# Patient Record
Sex: Male | Born: 1955 | Race: Black or African American | Hispanic: No | Marital: Single | Smoking: Current some day smoker
Health system: Southern US, Community
[De-identification: ages and names within clinical notes are randomized; demographics above are authoritative.]

## PROBLEM LIST (undated history)

## (undated) DIAGNOSIS — Z8711 Personal history of peptic ulcer disease: Secondary | ICD-10-CM

## (undated) DIAGNOSIS — M1990 Unspecified osteoarthritis, unspecified site: Secondary | ICD-10-CM

## (undated) DIAGNOSIS — Z8719 Personal history of other diseases of the digestive system: Secondary | ICD-10-CM

## (undated) DIAGNOSIS — M549 Dorsalgia, unspecified: Secondary | ICD-10-CM

## (undated) DIAGNOSIS — R51 Headache: Secondary | ICD-10-CM

## (undated) DIAGNOSIS — R531 Weakness: Secondary | ICD-10-CM

## (undated) DIAGNOSIS — H269 Unspecified cataract: Secondary | ICD-10-CM

## (undated) DIAGNOSIS — Z8709 Personal history of other diseases of the respiratory system: Secondary | ICD-10-CM

## (undated) DIAGNOSIS — K219 Gastro-esophageal reflux disease without esophagitis: Secondary | ICD-10-CM

## (undated) DIAGNOSIS — M2550 Pain in unspecified joint: Secondary | ICD-10-CM

## (undated) DIAGNOSIS — R0602 Shortness of breath: Secondary | ICD-10-CM

## (undated) DIAGNOSIS — I10 Essential (primary) hypertension: Secondary | ICD-10-CM

## (undated) DIAGNOSIS — M62838 Other muscle spasm: Secondary | ICD-10-CM

## (undated) HISTORY — PX: OTHER SURGICAL HISTORY: SHX169

## (undated) HISTORY — PX: SHOULDER SURGERY: SHX246

## (undated) HISTORY — PX: JOINT REPLACEMENT: SHX530

---

## 2002-02-10 ENCOUNTER — Encounter: Payer: Self-pay | Admitting: *Deleted

## 2002-02-10 ENCOUNTER — Emergency Department (HOSPITAL_COMMUNITY): Admission: EM | Admit: 2002-02-10 | Discharge: 2002-02-10 | Payer: Self-pay | Admitting: *Deleted

## 2003-09-05 ENCOUNTER — Emergency Department (HOSPITAL_COMMUNITY): Admission: EM | Admit: 2003-09-05 | Discharge: 2003-09-05 | Payer: Self-pay | Admitting: Emergency Medicine

## 2003-09-06 ENCOUNTER — Emergency Department (HOSPITAL_COMMUNITY): Admission: EM | Admit: 2003-09-06 | Discharge: 2003-09-06 | Payer: Self-pay | Admitting: Emergency Medicine

## 2004-02-19 ENCOUNTER — Emergency Department (HOSPITAL_COMMUNITY): Admission: EM | Admit: 2004-02-19 | Discharge: 2004-02-19 | Payer: Self-pay | Admitting: Emergency Medicine

## 2005-03-27 ENCOUNTER — Emergency Department (HOSPITAL_COMMUNITY): Admission: EM | Admit: 2005-03-27 | Discharge: 2005-03-27 | Payer: Self-pay | Admitting: Emergency Medicine

## 2005-08-02 ENCOUNTER — Emergency Department (HOSPITAL_COMMUNITY): Admission: EM | Admit: 2005-08-02 | Discharge: 2005-08-02 | Payer: Self-pay | Admitting: Emergency Medicine

## 2005-12-08 ENCOUNTER — Ambulatory Visit (HOSPITAL_COMMUNITY): Admission: RE | Admit: 2005-12-08 | Discharge: 2005-12-08 | Payer: Self-pay | Admitting: Internal Medicine

## 2007-04-02 ENCOUNTER — Emergency Department (HOSPITAL_COMMUNITY): Admission: EM | Admit: 2007-04-02 | Discharge: 2007-04-02 | Payer: Self-pay | Admitting: Emergency Medicine

## 2008-06-06 ENCOUNTER — Emergency Department (HOSPITAL_COMMUNITY): Admission: EM | Admit: 2008-06-06 | Discharge: 2008-06-06 | Payer: Self-pay | Admitting: Emergency Medicine

## 2010-01-16 ENCOUNTER — Emergency Department (HOSPITAL_COMMUNITY): Admission: EM | Admit: 2010-01-16 | Discharge: 2010-01-16 | Payer: Self-pay | Admitting: Emergency Medicine

## 2010-05-06 ENCOUNTER — Encounter: Admission: RE | Admit: 2010-05-06 | Discharge: 2010-05-06 | Payer: Self-pay | Admitting: Neurosurgery

## 2011-05-19 LAB — URINE MICROSCOPIC-ADD ON

## 2011-05-19 LAB — URINALYSIS, ROUTINE W REFLEX MICROSCOPIC
Nitrite: POSITIVE — AB
Protein, ur: 30 — AB
Urobilinogen, UA: 0.2

## 2011-10-23 ENCOUNTER — Encounter (HOSPITAL_COMMUNITY): Payer: Self-pay | Admitting: *Deleted

## 2011-10-23 ENCOUNTER — Emergency Department (HOSPITAL_COMMUNITY)
Admission: EM | Admit: 2011-10-23 | Discharge: 2011-10-23 | Disposition: A | Discharge status: Home / Self Care | Payer: Medicare Other | Attending: Emergency Medicine | Admitting: Emergency Medicine

## 2011-10-23 DIAGNOSIS — F17200 Nicotine dependence, unspecified, uncomplicated: Secondary | ICD-10-CM | POA: Insufficient documentation

## 2011-10-23 DIAGNOSIS — M25519 Pain in unspecified shoulder: Secondary | ICD-10-CM | POA: Insufficient documentation

## 2011-10-23 MED ORDER — HYDROCODONE-ACETAMINOPHEN 5-325 MG PO TABS: 1.0000 | ORAL_TABLET | ORAL | Status: AC | PRN

## 2011-10-23 MED ORDER — MELOXICAM 7.5 MG PO TABS: ORAL_TABLET | ORAL | Status: DC

## 2011-10-23 NOTE — Discharge Instructions (Signed)
Please use Mobic 2 times daily with food for inflammation. Please use Norco for pain if needed. This medication may cause drowsiness, please use with caution. Please see the orthopedist listed above for followup and evaluation of your shoulder.Arthralgia Arthralgia is joint pain. A joint is a place where two bones meet. Joint pain can happen for many reasons. The joint can be bruised, stiff, infected, or weak from aging. Pain usually goes away after resting and taking medicine for soreness.  HOME CARE  Rest the joint as told by your doctor.   Keep the sore joint raised (elevated) for the first 24 hours.   Put ice on the joint area.   Put ice in a plastic bag.   Place a towel between your skin and the bag.   Leave the ice on for 15 to 20 minutes, 3 to 4 times a day.   Wear your splint, casting, elastic bandage, or sling as told by your doctor.   Only take medicine as told by your doctor. Do not take aspirin.   Use crutches as told by your doctor. Do not put weight on the joint until told to by your doctor.  GET HELP RIGHT AWAY IF:   You have bruising, puffiness (swelling), or more pain.   Your fingers or toes turn blue or start to lose feeling (numb).   Your medicine does not lessen the pain.   Your pain becomes severe.   You have a temperature by mouth above 102 F (38.9 C), not controlled by medicine.   You cannot move or use the joint.  MAKE SURE YOU:   Understand these instructions.   Will watch your condition.   Will get help right away if you are not doing well or get worse.  Document Released: 07/12/2009 Document Revised: 07/13/2011 Document Reviewed: 07/12/2009 [facility] Patient Information 2012 [place], [place].
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
## 2011-10-23 NOTE — ED Notes (Signed)
Pain in right shoulder, states it has been hurting for over a month

## 2011-10-23 NOTE — ED Notes (Signed)
Pain rt shoulder x 1 month, has had surgery on this shoulder in past. Increased pain with movement.

## 2011-10-27 NOTE — ED Provider Notes (Signed)
Medical screening examination/treatment/procedure(s) were performed by non-physician practitioner and as supervising physician I was immediately available for consultation/collaboration.   Shelda Jakes, MD 10/27/11 [phone]

## 2011-12-19 ENCOUNTER — Encounter (HOSPITAL_COMMUNITY): Payer: Self-pay | Admitting: *Deleted

## 2011-12-19 ENCOUNTER — Emergency Department (HOSPITAL_COMMUNITY)
Admission: EM | Admit: 2011-12-19 | Discharge: 2011-12-20 | Disposition: A | Discharge status: Home / Self Care | Payer: Medicare Other | Source: Home / Self Care | Attending: Emergency Medicine | Admitting: Emergency Medicine

## 2011-12-19 DIAGNOSIS — F419 Anxiety disorder, unspecified: Secondary | ICD-10-CM

## 2011-12-19 DIAGNOSIS — R45850 Homicidal ideations: Secondary | ICD-10-CM | POA: Insufficient documentation

## 2011-12-19 DIAGNOSIS — F411 Generalized anxiety disorder: Secondary | ICD-10-CM | POA: Insufficient documentation

## 2011-12-19 LAB — CBC
MCHC: 35.5 g/dL (ref 30.0–36.0)
Platelets: 200 10*3/uL (ref 150–400)
RDW: 12.7 % (ref 11.5–15.5)
WBC: 7.2 10*3/uL (ref 4.0–10.5)

## 2011-12-19 LAB — COMPREHENSIVE METABOLIC PANEL
AST: 24 U/L (ref 0–37)
Albumin: 3.5 g/dL (ref 3.5–5.2)
Alkaline Phosphatase: 74 U/L (ref 39–117)
BUN: 12 mg/dL (ref 6–23)
Potassium: 4.3 mEq/L (ref 3.5–5.1)
Sodium: 138 mEq/L (ref 135–145)
Total Protein: 7.2 g/dL (ref 6.0–8.3)

## 2011-12-19 LAB — ETHANOL: Alcohol, Ethyl (B): 91 mg/dL — ABNORMAL HIGH (ref 0–11)

## 2011-12-19 LAB — DIFFERENTIAL
Basophils Absolute: 0 10*3/uL (ref 0.0–0.1)
Eosinophils Absolute: 0.1 10*3/uL (ref 0.0–0.7)
Lymphocytes Relative: 35 % (ref 12–46)
Monocytes Relative: 8 % (ref 3–12)
Neutro Abs: 4 10*3/uL (ref 1.7–7.7)
Neutrophils Relative %: 55 % (ref 43–77)

## 2011-12-19 LAB — RAPID URINE DRUG SCREEN, HOSP PERFORMED
Amphetamines: NOT DETECTED
Opiates: NOT DETECTED
Tetrahydrocannabinol: NOT DETECTED

## 2011-12-19 NOTE — ED Notes (Signed)
Pt began to vomit. Md advised and verbal orders obtained for IM phenergan. Pt refuses and states he feels better now.

## 2011-12-19 NOTE — [facility] Counselor (Signed)
PT HAS BEEN ACCEPTED TO [facility] RM 504-1 BYT DR. Allena Katz. PT WILL BE TRANSFER ONCE MEDICALLY CLEARED.

## 2011-12-19 NOTE — ED Notes (Signed)
Pt cooperative at this time.  Pt ate dinner and is watching tv quietly.

## 2011-12-19 NOTE — ED Notes (Signed)
"  I feel like I'm overloaded. And I can't stop",  Feels like he will hurt "alot of people"

## 2011-12-19 NOTE — ED Notes (Signed)
Pt refusing to have blood drawn for labs; pt states "You will have to get a gun to draw my blood" MD notified; Awaiting till pt evaluated by ACT team before any more attempts to draw blood.

## 2011-12-19 NOTE — ED Provider Notes (Signed)
History  Scribed for Geoffery Lyons, MD, the patient was seen in room [facility]. This chart was scribed by Candelaria Stagers. The patient's care started at 5:01 PM    CSN: [id number]  Arrival date & time 12/19/11  1608   First MD Initiated Contact with Patient 12/19/11 1623      No chief complaint on file.    The history is provided by the patient.   Stephen Sullivan is a 56 y.o. male who presents to the Emergency Department complaining of "feeling overloaded".  Pt states that he feels high anxiety and feels like he might snap and hurt people.  He reports that life and his girlfriend have set him off and he has never felt like this before.  He denies suicidal thoughts.  He has no h/o psych issues and takes no medication.    History reviewed. No pertinent past medical history.  Past Surgical History  Procedure Date  . Shoulder surgery   . Hip surgery     History reviewed. No pertinent family history.  History  Substance Use Topics  . Smoking status: Current Everyday Smoker -- 0.5 packs/day  . Smokeless tobacco: Not on file  . Alcohol Use: Yes      Review of Systems  Psychiatric/Behavioral: Positive for behavioral problems. Negative for suicidal ideas.       Homicidal thoughts  All other systems reviewed and are negative.    Allergies  Review of patient's allergies indicates no known allergies.  Home Medications   Current Outpatient Rx  Name Route Sig Dispense Refill  . MELOXICAM 7.5 MG PO TABS  1 po bid with food 14 tablet 0    BP 121/82  Pulse 107  Temp(Src) 98.4 F (36.9 C) (Oral)  Resp 20  Ht 5\' 11"  (1.803 m)  Wt 160 lb (72.576 kg)  BMI 22.32 kg/m2  SpO2 94%  Physical Exam  Nursing note and vitals reviewed. Constitutional: He is oriented to person, place, and time. He appears well-developed and well-nourished.  HENT:  Head: Normocephalic and atraumatic.  Eyes: Conjunctivae and EOM are normal. Pupils are equal, round, and reactive to light.  Neck:  Normal range of motion.  Pulmonary/Chest: Effort normal and breath sounds normal.  Musculoskeletal: Normal range of motion. He exhibits no edema.  Neurological: He is alert and oriented to person, place, and time.  Skin: Skin is warm and dry.    ED Course  Procedures   DIAGNOSTIC STUDIES: Oxygen Saturation is 94% on room air, normal by my interpretation.    COORDINATION OF CARE:  17:06PM Ordered: Consult to call act team ; CBC ; Differential ; Basic metabolic panel ; Drug screen panel, emergency ; Ethanol  20:37 Ordered: CBC ; Differential ; Comprehensive metabolic panel ; Ethanol    Labs Reviewed  URINE RAPID DRUG SCREEN (HOSP PERFORMED)  CBC  DIFFERENTIAL  COMPREHENSIVE METABOLIC PANEL  ETHANOL   No results found.   No diagnosis found.    MDM  The patient was seen by the act team and arrangements have been made for transfer to behavioral health.  The labs look okay and the patient is medically cleared for transfer.   I personally performed the services described in this documentation, which was scribed in my presence. The recorded information has been reviewed and considered.           Geoffery Lyons, MD 12/19/11 [phone]
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
## 2011-12-20 ENCOUNTER — Encounter (HOSPITAL_COMMUNITY): Payer: Self-pay

## 2011-12-20 ENCOUNTER — Inpatient Hospital Stay (HOSPITAL_COMMUNITY)
Admission: AD | Admit: 2011-12-20 | Discharge: 2011-12-20 | DRG: 882 | Disposition: A | Discharge status: Home / Self Care | Payer: Medicare Other | Source: Ambulatory Visit | Attending: Psychiatry | Admitting: Psychiatry

## 2011-12-20 DIAGNOSIS — F4320 Adjustment disorder, unspecified: Secondary | ICD-10-CM | POA: Diagnosis present

## 2011-12-20 DIAGNOSIS — F320 Major depressive disorder, single episode, mild: Secondary | ICD-10-CM

## 2011-12-20 DIAGNOSIS — F17200 Nicotine dependence, unspecified, uncomplicated: Secondary | ICD-10-CM | POA: Diagnosis present

## 2011-12-20 DIAGNOSIS — I10 Essential (primary) hypertension: Secondary | ICD-10-CM | POA: Diagnosis present

## 2011-12-20 DIAGNOSIS — F4325 Adjustment disorder with mixed disturbance of emotions and conduct: Principal | ICD-10-CM | POA: Diagnosis present

## 2011-12-20 MED ORDER — TRAZODONE HCL 100 MG PO TABS: 100.0000 mg | ORAL_TABLET | Freq: Every day | ORAL | Status: DC

## 2011-12-20 MED ORDER — ACETAMINOPHEN 325 MG PO TABS: 650.0000 mg | ORAL_TABLET | Freq: Four times a day (QID) | ORAL | Status: DC | PRN

## 2011-12-20 MED ORDER — BENAZEPRIL HCL 10 MG PO TABS: 10.0000 mg | ORAL_TABLET | Freq: Every day | ORAL | Status: DC

## 2011-12-20 MED ORDER — ALUM & MAG HYDROXIDE-SIMETH 200-200-20 MG/5ML PO SUSP: 30.0000 mL | ORAL | Status: DC | PRN

## 2011-12-20 MED ORDER — MAGNESIUM HYDROXIDE 400 MG/5ML PO SUSP: 30.0000 mL | Freq: Every day | ORAL | Status: DC | PRN

## 2011-12-20 MED ORDER — TRAZODONE HCL 100 MG PO TABS
100.0000 mg | ORAL_TABLET | Freq: Every day | ORAL | Status: DC
  Filled 2011-12-20 (×2): qty 1

## 2011-12-20 MED ORDER — BENAZEPRIL HCL 10 MG PO TABS
10.0000 mg | ORAL_TABLET | Freq: Every day | ORAL | Status: DC
  Administered 2011-12-20: 10 mg via ORAL
  Filled 2011-12-20 (×2): qty 1

## 2011-12-20 MED ORDER — NICOTINE 14 MG/24HR TD PT24
14.0000 mg | MEDICATED_PATCH | Freq: Once | TRANSDERMAL | Status: DC
  Administered 2011-12-20: 14 mg via TRANSDERMAL
  Filled 2011-12-20: qty 1

## 2011-12-20 NOTE — [facility] Suicide Risk Assessment (Signed)
Suicide Risk Assessment  Admission Assessment     See D/C Suicide risk assessment this date and hour.  Stephen Sullivan 12/20/2011, 2:18 PM

## 2011-12-20 NOTE — [facility] Counselor (Signed)
Counselor was unable to get consent from [place] or to contact support person for suicide prevention information, due to him being discharged so quickly. Counselor was unaware that he was being discharged until after he had left. According to notes from assessment counselor, Chanan did not state that he was suicidal, but he has attempted suicide in the past which was triggered by a similar situation (breakup with a girlfriend), and could not contract for safety at the time of admission.   Billie Lade 12/20/2011  3:07 PM
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
## 2011-12-20 NOTE — Progress Notes (Addendum)
[facility] MD Progress Note  12/20/2011 2:07 PM  Diagnosis:  Axis I: Adjustment Disorder NOS  ADL's:  Intact  Sleep: Fair  Appetite:  Good  Suicidal Ideation:  Denies adamantly any suicidal thoughts. Homicidal Ideation:  Denies adamantly any homicidal thoughts.  Mental Status Examination/Evaluation: Objective:  Appearance: Casual  Eye Contact::  Good  Speech:  Clear and Coherent  Volume:  Normal  Mood:  Euthymic  Affect:  Congruent  Thought Process:  Coherent  Orientation:  Full  Thought Content:  WDL  Suicidal Thoughts:  No  Homicidal Thoughts:  No  Memory:  Immediate;   Good  Judgement:  Good  Insight:  Good  Psychomotor Activity:  Normal  Concentration:  Good  Recall:  Good  Akathisia:  No  AIMS (if indicated):     Assets:  Communication Skills Desire for Improvement  Sleep:  Number of Hours: 2    Vital Signs:Blood pressure 155/101, pulse 80, temperature 97.3 F (36.3 C), temperature source Oral, resp. rate 18, height 5\' 7"  (1.702 m), weight 72.576 kg (160 lb). Current Medications: Current Facility-Administered Medications  Medication Dose Route Frequency Provider Last Rate Last Dose  . acetaminophen (TYLENOL) tablet 650 mg  650 mg Oral Q6H PRN Curlene Labrum Readling, MD      . alum & mag hydroxide-simeth (MAALOX/MYLANTA) 200-200-20 MG/5ML suspension 30 mL  30 mL Oral Q4H PRN Curlene Labrum Readling, MD      . magnesium hydroxide (MILK OF MAGNESIA) suspension 30 mL  30 mL Oral Daily PRN Curlene Labrum Readling, MD      . traZODone (DESYREL) tablet 100 mg  100 mg Oral QHS Ronny Bacon, MD       Facility-Administered Medications Ordered in Other Encounters  Medication Dose Route Frequency Provider Last Rate Last Dose  . DISCONTD: nicotine (NICODERM CQ - dosed in mg/24 hours) patch 14 mg  14 mg Transdermal Once [employer]. Colon Branch, MD   14 mg at 12/20/11 0056    Lab Results:  Results for orders placed during the hospital encounter of 12/19/11 (from the past 48 hour(s))  CBC     Status:  Abnormal   Collection Time   12/19/11  9:25 PM      Component Value Range Comment   WBC 7.2  4.0 - 10.5 (K/uL)    RBC 5.14  4.22 - 5.81 (MIL/uL)    Hemoglobin 17.1 (*) 13.0 - 17.0 (g/dL)    HCT 08.6  57.8 - 46.9 (%)    MCV 93.8  78.0 - 100.0 (fL)    MCH 33.3  26.0 - 34.0 (pg)    MCHC 35.5  30.0 - 36.0 (g/dL)    RDW 62.9  52.8 - 41.3 (%)    Platelets 200  150 - 400 (K/uL)   DIFFERENTIAL     Status: Normal   Collection Time   12/19/11  9:25 PM      Component Value Range Comment   Neutrophils Relative 55  43 - 77 (%)    Lymphocytes Relative 35  12 - 46 (%)    Monocytes Relative 8  3 - 12 (%)    Eosinophils Relative 2  0 - 5 (%)    Basophils Relative 0  0 - 1 (%)    Neutro Abs 4.0  1.7 - 7.7 (K/uL)    Lymphs Abs 2.5  0.7 - 4.0 (K/uL)    Monocytes Absolute 0.6  0.1 - 1.0 (K/uL)    Eosinophils Absolute 0.1  0.0 - 0.7 (  K/uL)    Basophils Absolute 0.0  0.0 - 0.1 (K/uL)    Smear Review MORPHOLOGY UNREMARKABLE     COMPREHENSIVE METABOLIC PANEL     Status: Abnormal   Collection Time   12/19/11  9:25 PM      Component Value Range Comment   Sodium 138  135 - 145 (mEq/L)    Potassium 4.3  3.5 - 5.1 (mEq/L) SLIGHT HEMOLYSIS   Chloride 102  96 - 112 (mEq/L)    CO2 21  19 - 32 (mEq/L)    Glucose, Bld 87  70 - 99 (mg/dL)    BUN 12  6 - 23 (mg/dL)    Creatinine, Ser 6.04  0.50 - 1.35 (mg/dL)    Calcium 9.3  8.4 - 10.5 (mg/dL)    Total Protein 7.2  6.0 - 8.3 (g/dL)    Albumin 3.5  3.5 - 5.2 (g/dL)    AST 24  0 - 37 (U/L) SLIGHT HEMOLYSIS   ALT 17  0 - 53 (U/L)    Alkaline Phosphatase 74  39 - 117 (U/L)    Total Bilirubin 0.3  0.3 - 1.2 (mg/dL)    GFR calc non Af Amer 62 (*) >90 (mL/min)    GFR calc Af Amer 72 (*) >90 (mL/min)   ETHANOL     Status: Abnormal   Collection Time   12/19/11  9:25 PM      Component Value Range Comment   Alcohol, Ethyl (B) 91 (*) 0 - 11 (mg/dL)   URINE RAPID DRUG SCREEN (HOSP PERFORMED)     Status: Abnormal   Collection Time   12/19/11 10:49 PM      Component  Value Range Comment   Opiates NONE DETECTED  NONE DETECTED     Cocaine POSITIVE (*) NONE DETECTED     Benzodiazepines NONE DETECTED  NONE DETECTED     Amphetamines NONE DETECTED  NONE DETECTED     Tetrahydrocannabinol NONE DETECTED  NONE DETECTED     Barbiturates NONE DETECTED  NONE DETECTED      Physical Findings: AIMS:  , ,  ,  ,    CIWA:    COWS:     Treatment Plan Summary: Discharge as pt's crisis has passed and he has a plan that will help him better handle it.  Plan: D/C now. Nurse practitioner will give him Lotensin now for his elevated blood pressure.  Deslyn Cavenaugh 12/20/2011, 2:07 PM
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
## 2011-12-20 NOTE — Progress Notes (Signed)
12/20/2011 Nursing DC 1430 Per MD order..the patient given DC instructions via  AVS. Pt stated he understood and will comply with F/ U plans listed on AVS. Pt's BP checked prior to DC and given to A Nwoko NP and per her request, pt given  Dose of benazapril 10 mg PO. Pt's BP  Sitting 151/92 and standing 146/98. Pt denied SI, HI, and / or peresence of audit, vis, tactile halluc. Pt states he is safe, that he will get prescription for benazapril filled today and that he will cont taking in the morning.Pt's belongs returned to him and he is escorted to bldg entrance where he is DC'd. PD RN [facility]

## 2011-12-20 NOTE — Progress Notes (Signed)
[facility] Case Management Discharge Plan:  Will you be returning to the same living situation after discharge: Yes,  Patient will return to his home at discharge At discharge, do you have transportation home?:Yes,  Patient will arrange for transportation home Do you have the ability to pay for your medications:Yes,  Patient has insurance  Interagency Information:     Release of information consent forms completed and in the chart;  Patient's signature needed at discharge.  Patient to Follow up at:    Patient denies SI/HI:   Yes,  Patient is no longer endorsing thoughts of HI or SI    Safety Planning and Suicide Prevention discussed:  Yes,  Reviewed with patient individually  Barrier to discharge identified:Yes,  Conflictual relationships with ex-girlfriend  Summary and Recommendations:  Patient advised of plans to get 50-B against girlfriend.  He is also encouraged to follow up with outpatient recommendations   Wynn Banker 12/20/2011, 11:21 AM

## 2011-12-20 NOTE — Discharge Instructions (Signed)
Strongly consider attending at least 6 [employer] Meetings to help you learn about how your helping others to the exclusion of helping yourself is actually hurting yourself and is actually an addiction to fixing others and that you need to work the 12 Step to Happiness through the Alanon Tradition. [employer] Family Groups could be helpful with how to deal with substance abusing family and friends. Or your own issues of being in victim role.  There are only 40 [employer] Family Group meetings a week here in [place].  There are [employer] Phone meetings.  Search on line and there you can learn the format and can access the schedule for yourself.  They ask you to temproarily block call waiting by starting with *70 then their number is [phone]  

## 2011-12-20 NOTE — Progress Notes (Signed)
Patient ID: Stephen Sullivan, male   DOB: 04-29-1956, 56 y.o.   MRN: [id number]

## 2011-12-20 NOTE — Progress Notes (Signed)
28 yr.old, divorced male admitted with chief complaint of, "overloaded---felt I could't stop myself---wanted to come in to talk to someone before I hurt somebody."  This is his first psychiatric hospitalization.  Hesitant to disclose information therefore most of information obtained by question and answer method. Denies any legal problems  Admits but minimizes  Use of ETOH, Marijuana and crack cocaine, explaining, "I don't have money to buy it."   Anger is directed towards his girlfriend who "wants me to buy her everything."  Denies any history of disturbed thinking or suicidal behavior.  Relates in pleasant, avoidant manner.  Severe "acne scars on torso and arms.  No medical illnesses.  Blood pressure elevated on admission.

## 2011-12-20 NOTE — Tx Team (Signed)
Initial Interdisciplinary Treatment Plan  PATIENT STRENGTHS: (choose at least two) Average or above average intelligence Capable of independent living Communication skills General fund of knowledge Physical Health  PATIENT STRESSORS: Marital or family conflict Substance abuse   PROBLEM LIST: Problem List/Patient Goals Date to be addressed Date deferred Reason deferred Estimated date of resolution  Anger--conflict with girlfriend 12/19/11     No social  outlets 12/19/11     Limited inancial resources 12/19/11     Boredom 5/14//13                                    DISCHARGE CRITERIA:  Improved stabilization in mood, thinking, and/or behavior Motivation to continue treatment in a less acute level of care Need for constant or close observation no longer present Safe-care adequate arrangements made Verbal commitment to aftercare and medication compliance  PRELIMINARY DISCHARGE PLAN: Outpatient therapy Return to previous living arrangement  PATIENT/FAMIILY INVOLVEMENT: This treatment plan has been presented to and reviewed with the patient, ONA RATHERT, and/or family member,.  The patient and family have been given the opportunity to ask questions and make suggestions.  Alicia Amel 12/20/2011, 5:39 AM

## 2011-12-20 NOTE — Tx Team (Signed)
Interdisciplinary Treatment Plan Update (Adult)  Date:  12/20/2011  Time Reviewed:  10:27 AM   Progress in Treatment: Attending groups:   Yes   Participating in groups:  Yes Taking medication as prescribed:  Yes Tolerating medication:  Yes Family/Significant othe contact made: Counselor to follow up with patient regarding collateral contact Patient understands diagnosis:  Yes Discussing patient identified problems/goals with staff: Yes Medical problems stabilized or resolved: Yes Denies suicidal/homicidal ideation:Yes Issues/concerns per patient self-inventory:  None identified Other:  New problem(s) identified:  Reason for Continuation of Hospitalization: Depression Medication stabilization  Interventions implemented related to continuation of hospitalization:  Medication Management; safety checks q 15 mins  Additional comments:  Estimated length of stay:  1-2 days  Discharge Plan:  Home with outpatient follow up  New goal(s):  Review of initial/current patient goals per problem list:    1.  Goal(s):  Eliminate HI  Met:  Yes  Target date: d/c  As evidenced by:  Patient no longer endorsing HI or any thoughts of self harm  2.  Goal (s): Decrease depression/anxiety  (rated at    On admission)  Met:  Yes  Target date: d/c  As evidenced by: Patient will rate depression at four our below (currently rating depression and anxiety at one)  3.  Goal(s):   Met:  Yes  Target date:  As evidenced by:  4.  Goal(s):  Refer for outpatient services  Met:  No  Target date: d/c  As evidenced by: Patient will have outpatient services in place  Attendees: Patient:  Stephen Sullivan 12/20/2011 11:20 AM  Family:     Physician:  Orson Aloe, MD 12/20/2011 10:27 AM   Nursing:   Shelda Jakes, RN 12/20/2011 10:27 AM   CaseManager:  Juline Patch, LCSW 12/20/2011 10:27 AM   Counselor:  Angus Palms, LCSW 12/20/2011 10:27 AM   Other:  Omelia Blackwater, RN 12/20/2011 10:42 AM    Other:  Reyes Ivan, LCSWA 12/20/2011  10:27 AM  Other:  Dr. Alfonso Patten, [facility] 12/20/2011  11:20 AM  Other:      Scribe for Treatment Team:   Wynn Banker, LCSW,  12/20/2011 10:27 AM

## 2011-12-21 NOTE — H&P (Signed)
Medical/psychiatric screening examination/treatment/procedure(s) were performed by non-physician practitioner and as supervising physician I was immediately available for consultation/collaboration.  I have seen and examined this patient and agree the major elements of this evaluation.  

## 2011-12-21 NOTE — Discharge Summary (Signed)
I agree with this D/C Summary.  

## 2011-12-22 NOTE — Progress Notes (Signed)
Patient Discharge Instructions:  After Visit Summary (AVS):   Access to EMR:  12/22/2011 Psychiatric Admission Assessment Note:   Access to EMR:  12/22/2011 Suicide Risk Assessment - Discharge Assessment:   Access to EMR:  12/22/2011 Next Level Care Provider Has Access to the EMR, 12/22/2011  Provided records to Dr. Tamsen Snider at [facility] via [facility] access.  Wandra Scot, 12/22/2011, 2:37 PM

## 2012-01-08 ENCOUNTER — Ambulatory Visit (HOSPITAL_COMMUNITY): Payer: Self-pay | Admitting: Psychology

## 2012-08-12 ENCOUNTER — Encounter (HOSPITAL_COMMUNITY): Payer: Self-pay

## 2012-08-12 ENCOUNTER — Emergency Department (HOSPITAL_COMMUNITY): Payer: Medicare Other

## 2012-08-12 ENCOUNTER — Emergency Department (HOSPITAL_COMMUNITY)
Admission: EM | Admit: 2012-08-12 | Discharge: 2012-08-12 | Disposition: A | Discharge status: Home / Self Care | Payer: Medicare Other | Attending: Emergency Medicine | Admitting: Emergency Medicine

## 2012-08-12 DIAGNOSIS — F17200 Nicotine dependence, unspecified, uncomplicated: Secondary | ICD-10-CM | POA: Insufficient documentation

## 2012-08-12 DIAGNOSIS — R109 Unspecified abdominal pain: Secondary | ICD-10-CM | POA: Insufficient documentation

## 2012-08-12 DIAGNOSIS — R10A0 Flank pain, unspecified side: Secondary | ICD-10-CM

## 2012-08-12 DIAGNOSIS — R21 Rash and other nonspecific skin eruption: Secondary | ICD-10-CM | POA: Insufficient documentation

## 2012-08-12 DIAGNOSIS — Z79899 Other long term (current) drug therapy: Secondary | ICD-10-CM | POA: Insufficient documentation

## 2012-08-12 LAB — CBC WITH DIFFERENTIAL/PLATELET
Basophils Relative: 0 % (ref 0–1)
HCT: 47.2 % (ref 39.0–52.0)
Hemoglobin: 16 g/dL (ref 13.0–17.0)
Lymphocytes Relative: 23 % (ref 12–46)
MCHC: 33.9 g/dL (ref 30.0–36.0)
MCV: 93.8 fL (ref 78.0–100.0)
Monocytes Absolute: 0.6 10*3/uL (ref 0.1–1.0)
Monocytes Relative: 6 % (ref 3–12)
Neutro Abs: 7.9 10*3/uL — ABNORMAL HIGH (ref 1.7–7.7)

## 2012-08-12 LAB — BASIC METABOLIC PANEL
BUN: 13 mg/dL (ref 6–23)
CO2: 28 mEq/L (ref 19–32)
Chloride: 102 mEq/L (ref 96–112)
Creatinine, Ser: 1.39 mg/dL — ABNORMAL HIGH (ref 0.50–1.35)

## 2012-08-12 LAB — URINALYSIS, ROUTINE W REFLEX MICROSCOPIC
Bilirubin Urine: NEGATIVE
Glucose, UA: NEGATIVE mg/dL
Ketones, ur: NEGATIVE mg/dL
pH: 6 (ref 5.0–8.0)

## 2012-08-12 MED ORDER — HYDROCORTISONE 1 % EX OINT: TOPICAL_OINTMENT | Freq: Two times a day (BID) | CUTANEOUS | Status: DC

## 2012-08-12 MED ORDER — BENAZEPRIL HCL 10 MG PO TABS: 10.0000 mg | ORAL_TABLET | Freq: Every day | ORAL | Status: DC

## 2012-08-12 MED ORDER — OXYCODONE-ACETAMINOPHEN 5-325 MG PO TABS: 1.0000 | ORAL_TABLET | Freq: Four times a day (QID) | ORAL | Status: DC | PRN

## 2012-08-12 MED ORDER — MORPHINE SULFATE 4 MG/ML IJ SOLN
4.0000 mg | Freq: Once | INTRAMUSCULAR | Status: AC
  Administered 2012-08-12: 4 mg via INTRAVENOUS
  Filled 2012-08-12: qty 1

## 2012-08-12 MED ORDER — KETOROLAC TROMETHAMINE 30 MG/ML IJ SOLN
30.0000 mg | Freq: Once | INTRAMUSCULAR | Status: AC
  Administered 2012-08-12: 30 mg via INTRAVENOUS
  Filled 2012-08-12: qty 1

## 2012-08-12 NOTE — ED Provider Notes (Signed)
History   Scribed for No att. providers found, the patient was seen in room APA14/APA14 . This chart was scribed by Lewanda Rife.   CSN: [id number]  Arrival date & time 08/12/12  1408   First MD Initiated Contact with Patient 08/12/12 1512      Chief Complaint  Patient presents with  . Rib pain     (Consider location/radiation/quality/duration/timing/severity/associated sxs/prior treatment) HPI Stephen Sullivan is a 57 y.o. male who presents to the Emergency Department complaining of constant severe rib pain for the past 8 days. Pt denies injury to area. Pt states deep breaths are limited due to pain. Pt reports nothing improves pain. Pt states pain is aggravated by nothing.  No fever. No cough. No dysuria. No rash.     History reviewed. No pertinent past medical history.  Past Surgical History  Procedure Date  . Shoulder surgery   . Hip surgery     No family history on file.  History  Substance Use Topics  . Smoking status: Current Every Day Smoker -- 0.5 packs/day  . Smokeless tobacco: Not on file  . Alcohol Use: Yes      Review of Systems  Musculoskeletal:       Rib pain   Skin: Positive for rash.  All other systems reviewed and are negative.    Allergies  Review of patient's allergies indicates no known allergies.  Home Medications   Current Outpatient Rx  Name  Route  Sig  Dispense  Refill  . BENAZEPRIL HCL 10 MG PO TABS   Oral   Take 1 tablet (10 mg total) by mouth daily. For hypertension   30 tablet   0   . HYDROCORTISONE 1 % EX OINT   Topical   Apply topically 2 (two) times daily.   30 g   0   . OXYCODONE-ACETAMINOPHEN 5-325 MG PO TABS   Oral   Take 1-2 tablets by mouth every 6 (six) hours as needed for pain.   20 tablet   0   . TRAZODONE HCL 100 MG PO TABS   Oral   Take 1 tablet (100 mg total) by mouth at bedtime. For sleep   30 tablet   0     BP 159/95  Pulse 67  Temp 97.8 F (36.6 C) (Oral)  Resp 20  Ht 5\' 11"   (1.803 m)  Wt 175 lb (79.379 kg)  BMI 24.41 kg/m2  SpO2 97%  Physical Exam  Nursing note and vitals reviewed. Constitutional: He is oriented to person, place, and time. He appears well-developed and well-nourished.  HENT:  Head: Normocephalic and atraumatic.  Eyes: Conjunctivae normal are normal.  Neck: Normal range of motion.  Cardiovascular: Normal rate and regular rhythm.   Pulmonary/Chest: Effort normal and breath sounds normal.  Abdominal: Soft.  Musculoskeletal: Normal range of motion. He exhibits tenderness.       Tender in left lower ribs  No crepitus noted      Neurological: He is alert and oriented to person, place, and time.  Skin: Skin is warm and dry. Rash ( Macular rash all over back) noted.  Psychiatric: He has a normal mood and affect.    ED Course  Procedures (including critical care time)  Labs Reviewed  CBC WITH DIFFERENTIAL - Abnormal; Notable for the following:    WBC 11.3 (*)     Neutro Abs 7.9 (*)     All other components within normal limits  BASIC METABOLIC PANEL - Abnormal; Notable  for the following:    Creatinine, Ser 1.39 (*)     GFR calc non Af Amer 55 (*)     GFR calc Af Amer 64 (*)     All other components within normal limits  URINALYSIS, ROUTINE W REFLEX MICROSCOPIC  D-DIMER, QUANTITATIVE   Dg Chest 2 View  08/12/2012  *RADIOLOGY REPORT*  Clinical Data: 57 year old male left side chest pain.  CHEST - 2 VIEW  Comparison: 02/19/2004.  Findings: High  Cardiac size and mediastinal contours are within normal limits.  Visualized tracheal air column is within normal limits.  Lung volumes are within normal limits.  No pneumothorax, pulmonary edema, pleural effusion or confluent pulmonary opacity. Postoperative changes to the right shoulder re-identified. No acute osseous abnormality identified.  IMPRESSION: No acute cardiopulmonary abnormality.   Original Report Authenticated By: Erskine Speed, M.D.      1. Flank pain       MDM  Patient with  left flank pain. Worse with movement. No trauma. He has a rash in this area, however is similar to his rash over his back that he's had for long as he can remember. He has a negative d-dimer. X-ray does not show a cause. Could be musculoskeletal. I doubt intraabdominal pathology as a cause. Patient will be discharged home with pain medications hydrocortisone ointment for his chronic back rash. He also requests a refill of his blood pressure medicine. He'll follow with his primary care Dr.      I personally performed the services described in this documentation, which was scribed in my presence. The recorded information has been reviewed and is accurate.     Juliet Rude. Rubin Payor, MD 08/12/12 [phone]

## 2012-08-12 NOTE — ED Notes (Signed)
Pt reports left side pain for the past few days.  Pt denies any injury to the area.  Pt does report some sob.

## 2013-06-01 ENCOUNTER — Emergency Department (HOSPITAL_COMMUNITY)
Admission: EM | Admit: 2013-06-01 | Discharge: 2013-06-01 | Disposition: A | Discharge status: Home / Self Care | Payer: Medicare Other | Attending: Emergency Medicine | Admitting: Emergency Medicine

## 2013-06-01 ENCOUNTER — Encounter (HOSPITAL_COMMUNITY): Payer: Self-pay | Admitting: Emergency Medicine

## 2013-06-01 ENCOUNTER — Emergency Department (HOSPITAL_COMMUNITY): Payer: Medicare Other

## 2013-06-01 DIAGNOSIS — F1910 Other psychoactive substance abuse, uncomplicated: Secondary | ICD-10-CM | POA: Insufficient documentation

## 2013-06-01 DIAGNOSIS — R05 Cough: Secondary | ICD-10-CM | POA: Insufficient documentation

## 2013-06-01 DIAGNOSIS — R071 Chest pain on breathing: Secondary | ICD-10-CM | POA: Insufficient documentation

## 2013-06-01 DIAGNOSIS — E119 Type 2 diabetes mellitus without complications: Secondary | ICD-10-CM | POA: Insufficient documentation

## 2013-06-01 DIAGNOSIS — R059 Cough, unspecified: Secondary | ICD-10-CM | POA: Insufficient documentation

## 2013-06-01 DIAGNOSIS — R0789 Other chest pain: Secondary | ICD-10-CM

## 2013-06-01 DIAGNOSIS — M2550 Pain in unspecified joint: Secondary | ICD-10-CM | POA: Insufficient documentation

## 2013-06-01 DIAGNOSIS — I10 Essential (primary) hypertension: Secondary | ICD-10-CM | POA: Insufficient documentation

## 2013-06-01 DIAGNOSIS — F411 Generalized anxiety disorder: Secondary | ICD-10-CM | POA: Insufficient documentation

## 2013-06-01 DIAGNOSIS — R259 Unspecified abnormal involuntary movements: Secondary | ICD-10-CM | POA: Insufficient documentation

## 2013-06-01 DIAGNOSIS — R251 Tremor, unspecified: Secondary | ICD-10-CM

## 2013-06-01 DIAGNOSIS — R61 Generalized hyperhidrosis: Secondary | ICD-10-CM | POA: Insufficient documentation

## 2013-06-01 DIAGNOSIS — F17200 Nicotine dependence, unspecified, uncomplicated: Secondary | ICD-10-CM | POA: Insufficient documentation

## 2013-06-01 DIAGNOSIS — R110 Nausea: Secondary | ICD-10-CM | POA: Insufficient documentation

## 2013-06-01 DIAGNOSIS — R509 Fever, unspecified: Secondary | ICD-10-CM | POA: Insufficient documentation

## 2013-06-01 HISTORY — DX: Essential (primary) hypertension: I10

## 2013-06-01 LAB — COMPREHENSIVE METABOLIC PANEL
BUN: 17 mg/dL (ref 6–23)
CO2: 24 mEq/L (ref 19–32)
Chloride: 97 mEq/L (ref 96–112)
Creatinine, Ser: 1.36 mg/dL — ABNORMAL HIGH (ref 0.50–1.35)
GFR calc non Af Amer: 57 mL/min — ABNORMAL LOW (ref >90)
Total Bilirubin: 0.9 mg/dL (ref 0.3–1.2)

## 2013-06-01 LAB — CBC WITH DIFFERENTIAL/PLATELET
Basophils Relative: 0 % (ref 0–1)
HCT: 51.6 % (ref 39.0–52.0)
Hemoglobin: 18.4 g/dL — ABNORMAL HIGH (ref 13.0–17.0)
MCHC: 35.7 g/dL (ref 30.0–36.0)
MCV: 92.3 fL (ref 78.0–100.0)
Monocytes Absolute: 0.9 10*3/uL (ref 0.1–1.0)
Monocytes Relative: 9 % (ref 3–12)
Neutro Abs: 6.8 10*3/uL (ref 1.7–7.7)

## 2013-06-01 LAB — RAPID URINE DRUG SCREEN, HOSP PERFORMED: Opiates: NOT DETECTED

## 2013-06-01 LAB — ETHANOL: Alcohol, Ethyl (B): <11 mg/dL (ref 0–11)

## 2013-06-01 LAB — URINE MICROSCOPIC-ADD ON

## 2013-06-01 LAB — ACETAMINOPHEN LEVEL: Acetaminophen (Tylenol), Serum: <15 ug/mL (ref 10–30)

## 2013-06-01 LAB — URINALYSIS, ROUTINE W REFLEX MICROSCOPIC
Leukocytes, UA: NEGATIVE
Protein, ur: NEGATIVE mg/dL
Urobilinogen, UA: 0.2 mg/dL (ref 0.0–1.0)

## 2013-06-01 LAB — TROPONIN I: Troponin I: <0.3 ng/mL (ref <0.30)

## 2013-06-01 MED ORDER — SODIUM CHLORIDE 0.9 % IV BOLUS (SEPSIS)
1000.0000 mL | Freq: Once | INTRAVENOUS | Status: AC
  Administered 2013-06-01: 1000 mL via INTRAVENOUS

## 2013-06-01 NOTE — ED Notes (Signed)
Pt trunk and forehead warm and dry, states that he is feeling better,

## 2013-06-01 NOTE — ED Notes (Addendum)
Pt states "here it is again". Pt became sweaty, breathing hard, face red. Comfort measures given, deep breathing advised. Lasted approx 5 min. Vs obtained during episode and bp was 180/104 with hr 70.

## 2013-06-01 NOTE — ED Notes (Addendum)
Pt appears to more relaxed, trunk, forehead area still clammy, arms and legs warm and dry, stills complains of occasional "twitch" type to left ac area.

## 2013-06-01 NOTE — ED Notes (Signed)
Dr, Fonnie Jarvis at bedside speaking with pt,

## 2013-06-01 NOTE — ED Provider Notes (Signed)
CSN: [id number]     Arrival date & time 06/01/13  7829 History   First MD Initiated Contact with Patient 06/01/13 0930     Chief Complaint  Patient presents with  . Dizziness   (Consider location/radiation/quality/duration/timing/severity/associated sxs/prior Treatment) HPI Comments: Stephen Sullivan is a 57 y.o. Male presenting with an episode of feeling "stuck" stating he was sitting on the couch around 8 am watching tv when he felt that he couldn't move, despite trying to move his arms and legs.  He became lightheaded,  Sweaty, shaky and nauseated without emesis. He attempted to phone his mother but was too shaky to hold the phone.  He also describes frequent episodes of muscle spasm and cramping along with various complaint of orthopedic pain, specifically in his left shoulder,  Elbow and right hip from prior surgeries. He does endorse eating aspirin like candy without relief of his chronic pain.    He denies chest pain, shortness of breath and headache or visual changes, although later during the interview, admits right sided chest pressure without sob, cough or fever.  He has a history of hypertension, ran out of his medications over one month ago and is borderline diabetic.  He is a daily smoker and endorses moderate etoh intake,  Stating he last drank a can of beer and one mixed drink 2 days ago,  Although the friend at the bedside (whom he quickly dismissed from the room) endorsed that he drinks heavy and is not telling the whole story, also uses cocaine and marijuana.  He denies history of etoh withdrawal syndrome.   The history is provided by the patient and a friend.    Past Medical History  Diagnosis Date  . Diabetes mellitus without complication   . Hypertension    Past Surgical History  Procedure Laterality Date  . Shoulder surgery    . Hip surgery     History reviewed. No pertinent family history. History  Substance Use Topics  . Smoking status: Current Every Day Smoker --  0.50 packs/day  . Smokeless tobacco: Not on file  . Alcohol Use: Yes     Comment: once a week, 1-2 cans beer    Review of Systems  Constitutional: Positive for diaphoresis. Negative for fever and chills.  HENT: Negative for congestion and sore throat.   Eyes: Negative.   Respiratory: Positive for chest tightness. Negative for shortness of breath.   Cardiovascular: Negative for chest pain.  Gastrointestinal: Positive for nausea. Negative for abdominal pain.  Genitourinary: Negative.   Musculoskeletal: Positive for arthralgias. Negative for joint swelling and neck pain.  Skin: Negative.  Negative for rash and wound.  Neurological: Positive for dizziness and tremors. Negative for seizures, weakness, light-headedness, numbness and headaches.  Psychiatric/Behavioral: Negative.     Allergies  Review of patient's allergies indicates no known allergies.  Home Medications  No current outpatient prescriptions on file. BP 127/88  Pulse 51  Temp(Src) 97.9 F (36.6 C) (Oral)  Resp 13  Ht 5\' 10"  (1.778 m)  Wt 160 lb (72.576 kg)  BMI 22.96 kg/m2  SpO2 98% Physical Exam  Nursing note and vitals reviewed. Constitutional: He appears well-developed and well-nourished.  HENT:  Head: Normocephalic and atraumatic.  Eyes: Conjunctivae are normal.  Neck: Normal range of motion.  Cardiovascular: Normal rate, regular rhythm, normal heart sounds and intact distal pulses.   Pulmonary/Chest: Effort normal and breath sounds normal. He has no wheezes. He exhibits tenderness.  ttp right lateral lower chest wall.  No  ecchymosis, no rash or sign of trauma.  Abdominal: Soft. Bowel sounds are normal. There is no tenderness.  Musculoskeletal: Normal range of motion.  Neurological: He is alert.  Skin: Skin is warm. He is diaphoretic.  Psychiatric: His speech is normal. Thought content normal. His mood appears anxious.    ED Course  Procedures (including critical care time) Labs Review Labs Reviewed   CBC WITH DIFFERENTIAL - Abnormal; Notable for the following:    Hemoglobin 18.4 (*)    All other components within normal limits  COMPREHENSIVE METABOLIC PANEL - Abnormal; Notable for the following:    Sodium 134 (*)    Creatinine, Ser 1.36 (*)    GFR calc non Af Amer 57 (*)    GFR calc Af Amer 66 (*)    All other components within normal limits  URINE RAPID DRUG SCREEN (HOSP PERFORMED) - Abnormal; Notable for the following:    Cocaine POSITIVE (*)    Tetrahydrocannabinol POSITIVE (*)    All other components within normal limits  SALICYLATE LEVEL - Abnormal; Notable for the following:    Salicylate Lvl <2.0 (*)    All other components within normal limits  URINALYSIS, ROUTINE W REFLEX MICROSCOPIC - Abnormal; Notable for the following:    Specific Gravity, Urine >1.030 (*)    Hgb urine dipstick TRACE (*)    Ketones, ur 15 (*)    All other components within normal limits  TROPONIN I  GLUCOSE, CAPILLARY  ETHANOL  ACETAMINOPHEN LEVEL  URINE MICROSCOPIC-ADD ON   Imaging Review Dg Chest Portable 1 View  06/01/2013   CLINICAL DATA:  Hypertension and diabetes mellitus ; dizziness  EXAM: PORTABLE CHEST - 1 VIEW  COMPARISON:  August 12, 2012  FINDINGS: There is no edema or consolidation. Heart is upper normal in size with normal pulmonary vascularity. No adenopathy. There is postoperative change and arthropathy in the right shoulder.  IMPRESSION: No edema or consolidation.   Electronically Signed   By: Bretta Bang M.D.   On: 06/01/2013 10:08    EKG Interpretation     Ventricular Rate:  73 PR Interval:  132 QRS Duration: 86 QT Interval:  382 QTC Calculation: 420 R Axis:   48 Text Interpretation:  Sinus rhythm with occasional Premature ventricular complexes Otherwise normal ECG When compared with ECG of 12-Aug-2012 14:42, Premature ventricular complexes are now Present             Date: 06/01/2013  Rate: 73  Rhythm: normal sinus rhythm and premature ventricular  contractions (PVC)  QRS Axis: normal  Intervals: normal  ST/T Wave abnormalities: normal  Conduction Disutrbances:none  Narrative Interpretation:   Old EKG Reviewed: changes noted    MDM   1. Chest wall pain   2. Tremor   3. Polysubstance abuse    Pt was also seen by Dr Fonnie Jarvis.  Patients labs and/or radiological studies were viewed and considered during the medical decision making and disposition process.  Pt was given referrals for f/u care including assistance with substance abuse if he is interested.  F/u with pcp otherwise if sx return or become more frequent.  Motrin or tylenol for chest wall pain.     Burgess Amor, PA-C 06/01/13 1715

## 2013-06-01 NOTE — ED Provider Notes (Signed)
Medical screening examination/treatment/procedure(s) were conducted as a shared visit with non-physician practitioner(s) and myself.  I personally evaluated the patient during the encounter.  EKG Interpretation     Ventricular Rate:  73 PR Interval:  132 QRS Duration: 86 QT Interval:  382 QTC Calculation: 420 R Axis:   48 Text Interpretation:  Sinus rhythm with occasional Premature ventricular complexes Otherwise normal ECG When compared with ECG of 12-Aug-2012 14:42, Premature ventricular complexes are now Present           Patient with reproducible right lateral lower rib cage chest wall tenderness without cellulitis or zoster rash noted, he is a chronic rash in his back which does not appear to have secondary cellulitis, his abdomen is nontender, his alcohol level is 0 and he no longer has any shakes or sweats in the emergency department he denies any threats to harm himself or others, and is not hallucinating, he does not appear to be in severe withdrawal.  Hurman Horn, MD 06/08/13 [phone]

## 2013-06-01 NOTE — ED Notes (Signed)
Pt states he became dizzy, sweating nausea, cramping to left foot and pain to posterior left shoulder. Burning to chest when he swallows. N/v x 1 on way here. Slight clammy to face at present. lnbm this am. Did not look at bm. Alert/oriented.

## 2013-08-02 ENCOUNTER — Emergency Department (HOSPITAL_COMMUNITY)
Admission: EM | Admit: 2013-08-02 | Discharge: 2013-08-02 | Disposition: A | Discharge status: Home / Self Care | Payer: Medicare Other | Attending: Emergency Medicine | Admitting: Emergency Medicine

## 2013-08-02 ENCOUNTER — Encounter (HOSPITAL_COMMUNITY): Payer: Self-pay | Admitting: Emergency Medicine

## 2013-08-02 DIAGNOSIS — M545 Low back pain, unspecified: Secondary | ICD-10-CM | POA: Insufficient documentation

## 2013-08-02 DIAGNOSIS — I10 Essential (primary) hypertension: Secondary | ICD-10-CM | POA: Insufficient documentation

## 2013-08-02 DIAGNOSIS — R52 Pain, unspecified: Secondary | ICD-10-CM | POA: Insufficient documentation

## 2013-08-02 DIAGNOSIS — R5381 Other malaise: Secondary | ICD-10-CM | POA: Insufficient documentation

## 2013-08-02 DIAGNOSIS — M79609 Pain in unspecified limb: Secondary | ICD-10-CM | POA: Insufficient documentation

## 2013-08-02 DIAGNOSIS — F17200 Nicotine dependence, unspecified, uncomplicated: Secondary | ICD-10-CM | POA: Insufficient documentation

## 2013-08-02 DIAGNOSIS — M549 Dorsalgia, unspecified: Secondary | ICD-10-CM

## 2013-08-02 DIAGNOSIS — E119 Type 2 diabetes mellitus without complications: Secondary | ICD-10-CM | POA: Insufficient documentation

## 2013-08-02 HISTORY — DX: Dorsalgia, unspecified: M54.9

## 2013-08-02 MED ORDER — IBUPROFEN 400 MG PO TABS
600.0000 mg | ORAL_TABLET | Freq: Once | ORAL | Status: AC
  Administered 2013-08-02: 600 mg via ORAL
  Filled 2013-08-02: qty 2

## 2013-08-02 MED ORDER — DIAZEPAM 5 MG PO TABS: 5.0000 mg | ORAL_TABLET | Freq: Three times a day (TID) | ORAL | Status: DC | PRN

## 2013-08-02 MED ORDER — DEXAMETHASONE 4 MG PO TABS
8.0000 mg | ORAL_TABLET | Freq: Once | ORAL | Status: AC
  Administered 2013-08-02: 8 mg via ORAL
  Filled 2013-08-02: qty 2

## 2013-08-02 MED ORDER — DIAZEPAM 5 MG PO TABS
5.0000 mg | ORAL_TABLET | Freq: Once | ORAL | Status: AC
  Administered 2013-08-02: 5 mg via ORAL
  Filled 2013-08-02: qty 1

## 2013-08-02 MED ORDER — HYDROMORPHONE HCL PF 1 MG/ML IJ SOLN
1.0000 mg | Freq: Once | INTRAMUSCULAR | Status: AC
  Administered 2013-08-02: 1 mg via INTRAMUSCULAR
  Filled 2013-08-02: qty 1

## 2013-08-02 MED ORDER — IBUPROFEN 600 MG PO TABS: 600.0000 mg | ORAL_TABLET | Freq: Four times a day (QID) | ORAL | Status: DC | PRN

## 2013-08-02 MED ORDER — DEXAMETHASONE 4 MG PO TABS: 4.0000 mg | ORAL_TABLET | Freq: Two times a day (BID) | ORAL | Status: DC

## 2013-08-02 MED ORDER — OXYCODONE-ACETAMINOPHEN 5-325 MG PO TABS: 1.0000 | ORAL_TABLET | ORAL | Status: DC | PRN

## 2013-08-02 NOTE — ED Notes (Signed)
Pt reports has chronic lower back pain.  Reports pain got worse Christmas night. Denies injury.  Pt says both legs get numb and has difficulty walking.  Denies any problems with bowels or bladder.

## 2013-08-02 NOTE — ED Provider Notes (Signed)
CSN: [id number]     Arrival date & time 08/02/13  1148 History  This chart was scribed for Raeford Razor, MD by Smiley Houseman, ED Scribe. The patient was seen in room APA12/APA12. Patient's care was started at 1:37 PM.    Chief Complaint  Patient presents with  . Back Pain   The history is provided by the patient. No language interpreter was used.    HPI Comments: Stephen Sullivan is a 57 y.o. male who presents to the Emergency Department complaining of severe worsening lower back pain that radiates to his left leg and hips that started two days ago. He describes the back pain as burning and equal on both sides.  Pt describes pain on lateral side of left leg as burning with subjective weakness, but denies pain radiating to his left knee.  Pt states he was laying down when the pain onset and denies any injury to the area.  He reports the pain is worsened with movement.  He denies numbness, difficulty urinating, fever, and chills.  He denies taking anything for pain relief. Patient denies h/o of back surgery, but he has had hip and shoulder surgery.     PCP-Dr. Christell Constant   Past Medical History  Diagnosis Date  . Diabetes mellitus without complication   . Hypertension   . Back pain    Past Surgical History  Procedure Laterality Date  . Shoulder surgery    . Hip surgery     No family history on file. History  Substance Use Topics  . Smoking status: Current Every Day Smoker -- 0.50 packs/day  . Smokeless tobacco: Not on file  . Alcohol Use: Yes     Comment: once a week, 1-2 cans beer    Review of Systems  All other systems reviewed and are negative.    Allergies  Review of patient's allergies indicates no known allergies.  Home Medications  No current outpatient prescriptions on file. Triage Vitals: BP 135/97  Pulse 61  Temp(Src) 97.3 F (36.3 C) (Oral)  Resp 20  Ht 5\' 10"  (1.778 m)  Wt 160 lb (72.576 kg)  BMI 22.96 kg/m2  SpO2 100% Physical Exam  Nursing note and  vitals reviewed. Constitutional: He appears well-developed and well-nourished. No distress.  HENT:  Head: Normocephalic and atraumatic.  Eyes: Conjunctivae are normal. Right eye exhibits no discharge. Left eye exhibits no discharge.  Neck: Neck supple.  Cardiovascular: Normal rate, regular rhythm and normal heart sounds.  Exam reveals no gallop and no friction rub.   No murmur heard. Pulmonary/Chest: Effort normal and breath sounds normal. No respiratory distress.  Abdominal: Soft. He exhibits no distension. There is no tenderness.  Musculoskeletal: He exhibits tenderness. He exhibits no edema.  Tenderness in lower lumbar spine, midline and paraspinally bilaterally. No overlying skin changes  Neurological: He is alert.  Strength is good with knee flexion and extension bilaterally.  Sensation intact too light touch. Patellar reflexes 1+   Skin: Skin is warm and dry.  Psychiatric: He has a normal mood and affect. His behavior is normal. Thought content normal.    ED Course  Procedures (including critical care time) DIAGNOSTIC STUDIES: Oxygen Saturation is 100% on RA, normal by my interpretation.    COORDINATION OF CARE: 1:44 PM-Patient informed of current plan of treatment and evaluation and agrees with plan.      Labs Review Labs Reviewed - No data to display Imaging Review No results found.  EKG Interpretation   None  MDM   1. Back pain, acute    57 year old male with lower back pain. Suspect Mrs. Nicholaus Bloom etiology. Patient is a trauma. He is afebrile. Neurological examination is unremarkable. Doubt cauda equina, spinal epidural abscess or spinal epidural hematoma. Plan symptomatic treatment at this time. Return precautions were discussed. Outpatient followup otherwise.  I personally preformed the services scribed in my presence. The recorded information has been reviewed is accurate. Raeford Razor, MD.    Raeford Razor, MD 08/07/13 [phone]

## 2013-11-20 ENCOUNTER — Encounter (HOSPITAL_COMMUNITY): Payer: Self-pay | Admitting: Emergency Medicine

## 2013-11-20 ENCOUNTER — Emergency Department (HOSPITAL_COMMUNITY)
Admission: EM | Admit: 2013-11-20 | Discharge: 2013-11-20 | Disposition: A | Discharge status: Home / Self Care | Payer: Medicare Other | Attending: Emergency Medicine | Admitting: Emergency Medicine

## 2013-11-20 DIAGNOSIS — E119 Type 2 diabetes mellitus without complications: Secondary | ICD-10-CM | POA: Insufficient documentation

## 2013-11-20 DIAGNOSIS — F17200 Nicotine dependence, unspecified, uncomplicated: Secondary | ICD-10-CM | POA: Insufficient documentation

## 2013-11-20 DIAGNOSIS — M5412 Radiculopathy, cervical region: Secondary | ICD-10-CM | POA: Insufficient documentation

## 2013-11-20 DIAGNOSIS — M5030 Other cervical disc degeneration, unspecified cervical region: Secondary | ICD-10-CM | POA: Insufficient documentation

## 2013-11-20 DIAGNOSIS — I10 Essential (primary) hypertension: Secondary | ICD-10-CM | POA: Insufficient documentation

## 2013-11-20 MED ORDER — BENAZEPRIL HCL 10 MG PO TABS: 10.0000 mg | ORAL_TABLET | Freq: Every day | ORAL | Status: DC

## 2013-11-20 MED ORDER — DEXAMETHASONE 6 MG PO TABS: ORAL_TABLET | ORAL | Status: DC

## 2013-11-20 MED ORDER — KETOROLAC TROMETHAMINE 60 MG/2ML IM SOLN
60.0000 mg | Freq: Once | INTRAMUSCULAR | Status: AC
  Administered 2013-11-20: 60 mg via INTRAMUSCULAR
  Filled 2013-11-20: qty 2

## 2013-11-20 MED ORDER — DIAZEPAM 5 MG PO TABS
10.0000 mg | ORAL_TABLET | Freq: Once | ORAL | Status: AC
  Administered 2013-11-20: 10 mg via ORAL
  Filled 2013-11-20: qty 2

## 2013-11-20 MED ORDER — DICLOFENAC SODIUM 75 MG PO TBEC: 75.0000 mg | DELAYED_RELEASE_TABLET | Freq: Two times a day (BID) | ORAL | Status: DC

## 2013-11-20 MED ORDER — CYCLOBENZAPRINE HCL 7.5 MG PO TABS: 7.5000 mg | ORAL_TABLET | Freq: Three times a day (TID) | ORAL | Status: DC | PRN

## 2013-11-20 MED ORDER — DEXAMETHASONE SODIUM PHOSPHATE 4 MG/ML IJ SOLN
8.0000 mg | Freq: Once | INTRAMUSCULAR | Status: AC
  Administered 2013-11-20: 8 mg via INTRAMUSCULAR
  Filled 2013-11-20: qty 2

## 2013-11-20 NOTE — Discharge Instructions (Signed)
I have reviewed your MRI, as well as your previous emergency department records. You have a history of degenerative disc disease, and cervical radiculopathy/pain. Please see the specialist on April 21 as scheduled. Please establish a primary care physician, to assist you with pain management. Use the prescribed medications as suggested. Carbamazepine may cause drowsiness, please use with caution. Cervical Radiculopathy Cervical radiculopathy happens when a nerve in the neck is pinched or bruised by a slipped (herniated) disk or by arthritic changes in the bones of the cervical spine. This can occur due to an injury or as part of the normal aging process. Pressure on the cervical nerves can cause pain or numbness that runs from your neck all the way down into your arm and fingers. CAUSES  There are many possible causes, including:  Injury.  Muscle tightness in the neck from overuse.  Swollen, painful joints (arthritis).  Breakdown or degeneration in the bones and joints of the spine (spondylosis) due to aging.  Bone spurs that may develop near the cervical nerves. SYMPTOMS  Symptoms include pain, weakness, or numbness in the affected arm and hand. Pain can be severe or irritating. Symptoms may be worse when extending or turning the neck. DIAGNOSIS  Your caregiver will ask about your symptoms and do a physical exam. He or she may test your strength and reflexes. X-rays, CT scans, and MRI scans may be needed in cases of injury or if the symptoms do not go away after a period of time. Electromyography (EMG) or nerve conduction testing may be done to study how your nerves and muscles are working. TREATMENT  Your caregiver may recommend certain exercises to help relieve your symptoms. Cervical radiculopathy can, and often does, get better with time and treatment. If your problems continue, treatment options may include:  Wearing a soft collar for short periods of time.  Physical therapy to  strengthen the neck muscles.  Medicines, such as nonsteroidal anti-inflammatory drugs (NSAIDs), oral corticosteroids, or spinal injections.  Surgery. Different types of surgery may be done depending on the cause of your problems. HOME CARE INSTRUCTIONS   Put ice on the affected area.  Put ice in a plastic bag.  Place a towel between your skin and the bag.  Leave the ice on for 15-20 minutes, 03-04 times a day or as directed by your caregiver.  If ice does not help, you can try using heat. Take a warm shower or bath, or use a hot water bottle as directed by your caregiver.  You may try a gentle neck and shoulder massage.  Use a flat pillow when you sleep.  Only take over-the-counter or prescription medicines for pain, discomfort, or fever as directed by your caregiver.  If physical therapy was prescribed, follow your caregiver's directions.  If a soft collar was prescribed, use it as directed. SEEK IMMEDIATE MEDICAL CARE IF:   Your pain gets much worse and cannot be controlled with medicines.  You have weakness or numbness in your hand, arm, face, or leg.  You have a high fever or a stiff, rigid neck.  You lose bowel or bladder control (incontinence).  You have trouble with walking, balance, or speaking. MAKE SURE YOU:   Understand these instructions.  Will watch your condition.  Will get help right away if you are not doing well or get worse. Document Released: 04/18/2001 Document Revised: 10/16/2011 Document Reviewed: 03/07/2011 [facility] Patient Information 2014 [place], [place].

## 2013-11-20 NOTE — ED Provider Notes (Signed)
CSN: [id number]     Arrival date & time 11/20/13  2841 History   First MD Initiated Contact with Patient 11/20/13 [phone]     Chief Complaint  Patient presents with  . Headache     (Consider location/radiation/quality/duration/timing/severity/associated sxs/prior Treatment) HPI Comments: Patient is a 58 year old male who presents to the emergency department with complaint of headache and neck pain for 2 weeks. The patient states that approximately 2 years ago it was found that he had degenerative disc disease and arthritis involving his neck. He was told that he should have surgery, but he did not choose to have it because he became frightened about the surgical procedure. He states that from time to time he has pain in his neck and his headache as well as pain in his shoulders. Over the past 2 weeks this problem has been getting progressively worse. At 4 AM this morning he was awakened with a severe pain on the left side. The pain is similar to the pain is in the past, but more intense. The patient denies any loss of consciousness, there's been no weakness of either right or left side. The patient is ambulatory. His been no nausea or vomiting. And the patient denies any unusual sensitivity to light and sound. He has not taken anything for the headache this morning. He states he is between doctors, and does not have a primary physician of record.  Patient is a 58 y.o. male presenting with headaches. The history is provided by the patient.  Headache Associated symptoms: neck pain   Associated symptoms: no abdominal pain, no back pain, no cough, no dizziness, no photophobia and no seizures     Past Medical History  Diagnosis Date  . Hypertension   . Back pain   . Diabetes mellitus without complication     pt denies   Past Surgical History  Procedure Laterality Date  . Shoulder surgery    . Hip surgery     No family history on file. History  Substance Use Topics  . Smoking status: Current  Every Day Smoker -- 0.50 packs/day    Types: Cigarettes  . Smokeless tobacco: Not on file  . Alcohol Use: Yes     Comment: once a week, 1-2 cans beer    Review of Systems  Constitutional: Negative for activity change.       All ROS Neg except as noted in HPI  HENT: Negative for nosebleeds.   Eyes: Negative for photophobia and discharge.  Respiratory: Negative for cough, shortness of breath and wheezing.   Cardiovascular: Negative for chest pain and palpitations.  Gastrointestinal: Negative for abdominal pain and blood in stool.  Genitourinary: Negative for dysuria, frequency and hematuria.  Musculoskeletal: Positive for neck pain. Negative for arthralgias and back pain.  Skin: Negative.   Neurological: Positive for headaches. Negative for dizziness, seizures and speech difficulty.  Psychiatric/Behavioral: Negative for hallucinations and confusion.      Allergies  Review of patient's allergies indicates no known allergies.  Home Medications   Prior to Admission medications   Medication Sig Start Date End Date Taking? Authorizing Provider  dexamethasone (DECADRON) 4 MG tablet Take 1 tablet (4 mg total) by mouth 2 (two) times daily. 08/02/13   Virgel Manifold, MD  diazepam (VALIUM) 5 MG tablet Take 1 tablet (5 mg total) by mouth every 8 (eight) hours as needed for muscle spasms. 08/02/13   Virgel Manifold, MD  ibuprofen (ADVIL,MOTRIN) 600 MG tablet Take 1 tablet (600 mg total) by  mouth every 6 (six) hours as needed. 08/02/13   Virgel Manifold, MD  oxyCODONE-acetaminophen (PERCOCET/ROXICET) 5-325 MG per tablet Take 1-2 tablets by mouth every 4 (four) hours as needed for severe pain. 08/02/13   Virgel Manifold, MD   BP 160/97  Pulse 58  Temp(Src) 97.7 F (36.5 C) (Oral)  Resp 16  Ht 5\' 10"  (1.778 m)  Wt 160 lb (72.576 kg)  BMI 22.96 kg/m2  SpO2 100% Physical Exam  Nursing note and vitals reviewed. Constitutional: He is oriented to person, place, and time. He appears well-developed  and well-nourished.  Non-toxic appearance.  HENT:  Head: Normocephalic.  Right Ear: Tympanic membrane and external ear normal.  Left Ear: Tympanic membrane and external ear normal.  Eyes: EOM and lids are normal. Pupils are equal, round, and reactive to light.  Neck: Normal range of motion. Neck supple. Carotid bruit is not present.  No carotid bruit appreciated on auscultation.  Cardiovascular: Normal rate, regular rhythm, normal heart sounds, intact distal pulses and normal pulses.   Pulmonary/Chest: Breath sounds normal. No respiratory distress.  Abdominal: Soft. Bowel sounds are normal. There is no tenderness. There is no guarding.  Musculoskeletal: Normal range of motion.  Pain to palpation of the posterior lateral cervical spine on the left. No palpable step off of the cervical spine. There is pain with attempted range of motion of the left shoulder. There is no deformity of the left shoulder. Is full range of motion of the left elbow, wrists, fingers. The radial pulses 2+. The capillary refill is less than 2 seconds.  Lymphadenopathy:       Head (right side): No submandibular adenopathy present.       Head (left side): No submandibular adenopathy present.    He has no cervical adenopathy.  Neurological: He is alert and oriented to person, place, and time. He has normal strength. No cranial nerve deficit or sensory deficit.  No cranial nerve deficits appreciated. There is no atrophy of the thenar eminences, forearm, or the biceps triceps area. The grip is symmetrical. No gross sensory deficits of the upper extremities.  Skin: Skin is warm and dry.  Psychiatric: He has a normal mood and affect. His speech is normal.    ED Course  Procedures (including critical care time) Labs Review Labs Reviewed - No data to display  Imaging Review No results found.   EKG Interpretation None      MDM Patient presents to the emergency department with complaint of headache that is been going  off and on for approximately 2 weeks. The patient felt that his pain had worsened early this morning. The patient has a history of cervical degenerative disc disease, accompanied by radiculopathy his. The examination today reveals no acute neurologic deficits, neurovascular compromise. Suspect that this is an exacerbation of the chronic problem the patient suffers from.  Plan at this time is for the patient be treated with flexmid 3 times daily, Decadron, and diclofenac. Patient is advised to see his primary physician for additional evaluation and for pain management. He will return to the emergency department if any acute changes.    Final diagnoses:  None    *I have reviewed nursing notes, vital signs, and all appropriate lab and imaging results for this patient.Lenox Ahr, PA-C 11/22/13 [phone]

## 2013-11-20 NOTE — ED Notes (Signed)
Pr reports headache x 2 wks - worsening at 4am this morning, worse on left side.  Denies light/sound sensitivity, denies visual disturbances.  Reports n/v.

## 2013-11-22 NOTE — ED Provider Notes (Signed)
  Medical screening examination/treatment/procedure(s) were performed by non-physician practitioner and as supervising physician I was immediately available for consultation/collaboration.   EKG Interpretation None         Carmin Muskrat, MD 11/22/13 1506

## 2013-11-25 DIAGNOSIS — M47812 Spondylosis without myelopathy or radiculopathy, cervical region: Secondary | ICD-10-CM | POA: Insufficient documentation

## 2013-11-25 DIAGNOSIS — M542 Cervicalgia: Secondary | ICD-10-CM | POA: Insufficient documentation

## 2013-12-01 ENCOUNTER — Other Ambulatory Visit: Payer: Self-pay | Admitting: Neurosurgery

## 2013-12-09 ENCOUNTER — Encounter (HOSPITAL_COMMUNITY): Payer: Self-pay | Admitting: Pharmacy Technician

## 2013-12-12 ENCOUNTER — Ambulatory Visit (HOSPITAL_COMMUNITY)
Admission: RE | Admit: 2013-12-12 | Discharge: 2013-12-12 | Disposition: A | Discharge status: Home / Self Care | Payer: Medicare Other | Source: Ambulatory Visit | Attending: Anesthesiology | Admitting: Anesthesiology

## 2013-12-12 ENCOUNTER — Encounter (HOSPITAL_COMMUNITY)
Admission: RE | Admit: 2013-12-12 | Discharge: 2013-12-12 | Disposition: A | Discharge status: Home / Self Care | Payer: Medicare Other | Source: Ambulatory Visit | Attending: Neurosurgery | Admitting: Neurosurgery

## 2013-12-12 ENCOUNTER — Encounter (HOSPITAL_COMMUNITY): Payer: Self-pay

## 2013-12-12 DIAGNOSIS — Z01812 Encounter for preprocedural laboratory examination: Secondary | ICD-10-CM | POA: Insufficient documentation

## 2013-12-12 DIAGNOSIS — Z01818 Encounter for other preprocedural examination: Secondary | ICD-10-CM | POA: Insufficient documentation

## 2013-12-12 HISTORY — DX: Headache: R51

## 2013-12-12 HISTORY — DX: Gastro-esophageal reflux disease without esophagitis: K21.9

## 2013-12-12 HISTORY — DX: Shortness of breath: R06.02

## 2013-12-12 HISTORY — DX: Personal history of other diseases of the respiratory system: Z87.09

## 2013-12-12 HISTORY — DX: Personal history of other diseases of the digestive system: Z87.19

## 2013-12-12 HISTORY — DX: Weakness: R53.1

## 2013-12-12 HISTORY — DX: Other muscle spasm: M62.838

## 2013-12-12 HISTORY — DX: Unspecified cataract: H26.9

## 2013-12-12 HISTORY — DX: Pain in unspecified joint: M25.50

## 2013-12-12 HISTORY — DX: Personal history of peptic ulcer disease: Z87.11

## 2013-12-12 HISTORY — DX: Unspecified osteoarthritis, unspecified site: M19.90

## 2013-12-12 LAB — BASIC METABOLIC PANEL
BUN: 10 mg/dL (ref 6–23)
CO2: 25 mEq/L (ref 19–32)
CREATININE: 1.16 mg/dL (ref 0.50–1.35)
Calcium: 9.3 mg/dL (ref 8.4–10.5)
Chloride: 105 mEq/L (ref 96–112)
GFR, EST AFRICAN AMERICAN: 79 mL/min — AB (ref >90)
GFR, EST NON AFRICAN AMERICAN: 68 mL/min — AB (ref >90)
Glucose, Bld: 86 mg/dL (ref 70–99)
Potassium: 4.2 mEq/L (ref 3.7–5.3)
Sodium: 142 mEq/L (ref 137–147)

## 2013-12-12 LAB — CBC
HCT: 43.6 % (ref 39.0–52.0)
Hemoglobin: 15.3 g/dL (ref 13.0–17.0)
MCH: 32.6 pg (ref 26.0–34.0)
MCHC: 35.1 g/dL (ref 30.0–36.0)
MCV: 93 fL (ref 78.0–100.0)
PLATELETS: 200 10*3/uL (ref 150–400)
RBC: 4.69 MIL/uL (ref 4.22–5.81)
RDW: 12.4 % (ref 11.5–15.5)
WBC: 7.9 10*3/uL (ref 4.0–10.5)

## 2013-12-12 LAB — SURGICAL PCR SCREEN
MRSA, PCR: NEGATIVE
Staphylococcus aureus: NEGATIVE

## 2013-12-12 NOTE — Pre-Procedure Instructions (Signed)
Stephen Sullivan  12/12/2013   Your procedure is scheduled on:  Thurs, May 14 @ 7:30 AM  Report to [facility] Entrance A  at 5:30 AM.  Call this number if you have problems the morning of surgery: [phone]   Remember:   Do not eat food or drink liquids after midnight.      Do not wear jewelry  Do not wear lotions, powders, or colognes. You may wear deodorant.  Men may shave face and neck.  Do not bring valuables to the hospital.  [facility] is not responsible                  for any belongings or valuables.               Contacts, dentures or bridgework may not be worn into surgery.  Leave suitcase in the car. After surgery it may be brought to your room.  For patients admitted to the hospital, discharge time is determined by your                treatment team.                  Special Instructions:  [facility] - Preparing for Surgery  Before surgery, you can play an important role.  Because skin is not sterile, your skin needs to be as free of germs as possible.  You can reduce the number of germs on you skin by washing with CHG (chlorahexidine gluconate) soap before surgery.  CHG is an antiseptic cleaner which kills germs and bonds with the skin to continue killing germs even after washing.  Please DO NOT use if you have an allergy to CHG or antibacterial soaps.  If your skin becomes reddened/irritated stop using the CHG and inform your nurse when you arrive at Short Stay.  Do not shave (including legs and underarms) for at least 48 hours prior to the first CHG shower.  You may shave your face.  Please follow these instructions carefully:   1.  Shower with CHG Soap the night before surgery and the                                morning of Surgery.  2.  If you choose to wash your hair, wash your hair first as usual with your       normal shampoo.  3.  After you shampoo, rinse your hair and body thoroughly to remove the                      Shampoo.  4.  Use CHG as you  would any other liquid soap.  You can apply chg directly       to the skin and wash gently with scrungie or a clean washcloth.  5.  Apply the CHG Soap to your body ONLY FROM THE NECK DOWN.        Do not use on open wounds or open sores.  Avoid contact with your eyes,       ears, mouth and genitals (private parts).  Wash genitals (private parts)       with your normal soap.  6.  Wash thoroughly, paying special attention to the area where your surgery        will be performed.  7.  Thoroughly rinse your body with warm water from the neck down.  8.  DO NOT shower/wash with your normal soap after using and rinsing off       the CHG Soap.  9.  Pat yourself dry with a clean towel.            10.  Wear clean pajamas.            11.  Place clean sheets on your bed the night of your first shower and do not        sleep with pets.  Day of Surgery  Do not apply any lotions/deoderants the morning of surgery.  Please wear clean clothes to the hospital/surgery center.     Please read over the following fact sheets that you were given: Pain Booklet, Coughing and Deep Breathing, MRSA Information and Surgical Site Infection Prevention

## 2013-12-12 NOTE — Progress Notes (Signed)
Pt doesn't have a cardiologist  Denies ever having echo/stress test/heart cath  EKG in epic from 06-01-13  Denies CXR in past year  Was seeing Dr.Quershi but hasn't seen him in 3 1/2 yrs

## 2013-12-17 MED ORDER — CEFAZOLIN SODIUM-DEXTROSE 2-3 GM-% IV SOLR
2.0000 g | INTRAVENOUS | Status: AC
  Administered 2013-12-18: 2 g via INTRAVENOUS
  Filled 2013-12-17: qty 50

## 2013-12-18 ENCOUNTER — Encounter (HOSPITAL_COMMUNITY): Payer: Medicare Other | Admitting: Anesthesiology

## 2013-12-18 ENCOUNTER — Encounter (HOSPITAL_COMMUNITY): Payer: Self-pay | Admitting: Anesthesiology

## 2013-12-18 ENCOUNTER — Encounter (HOSPITAL_COMMUNITY)
Admission: RE | Disposition: A | Discharge status: Home / Self Care | Payer: Self-pay | Source: Ambulatory Visit | Attending: Neurosurgery

## 2013-12-18 ENCOUNTER — Inpatient Hospital Stay (HOSPITAL_COMMUNITY)
Admission: RE | Admit: 2013-12-18 | Discharge: 2013-12-19 | DRG: 473 | Disposition: A | Discharge status: Home / Self Care | Payer: Medicare Other | Source: Ambulatory Visit | Attending: Neurosurgery | Admitting: Neurosurgery

## 2013-12-18 ENCOUNTER — Inpatient Hospital Stay (HOSPITAL_COMMUNITY): Payer: Medicare Other | Admitting: Anesthesiology

## 2013-12-18 ENCOUNTER — Inpatient Hospital Stay (HOSPITAL_COMMUNITY): Payer: Medicare Other

## 2013-12-18 DIAGNOSIS — M5030 Other cervical disc degeneration, unspecified cervical region: Secondary | ICD-10-CM | POA: Diagnosis present

## 2013-12-18 DIAGNOSIS — K219 Gastro-esophageal reflux disease without esophagitis: Secondary | ICD-10-CM | POA: Diagnosis present

## 2013-12-18 DIAGNOSIS — I10 Essential (primary) hypertension: Secondary | ICD-10-CM | POA: Diagnosis present

## 2013-12-18 DIAGNOSIS — F17200 Nicotine dependence, unspecified, uncomplicated: Secondary | ICD-10-CM | POA: Diagnosis present

## 2013-12-18 DIAGNOSIS — Z96649 Presence of unspecified artificial hip joint: Secondary | ICD-10-CM

## 2013-12-18 DIAGNOSIS — Z79899 Other long term (current) drug therapy: Secondary | ICD-10-CM

## 2013-12-18 DIAGNOSIS — M5020 Other cervical disc displacement, unspecified cervical region: Secondary | ICD-10-CM | POA: Diagnosis present

## 2013-12-18 DIAGNOSIS — M47812 Spondylosis without myelopathy or radiculopathy, cervical region: Principal | ICD-10-CM | POA: Diagnosis present

## 2013-12-18 DIAGNOSIS — E119 Type 2 diabetes mellitus without complications: Secondary | ICD-10-CM | POA: Diagnosis present

## 2013-12-18 HISTORY — PX: ANTERIOR CERVICAL DECOMP/DISCECTOMY FUSION: SHX1161

## 2013-12-18 LAB — GLUCOSE, CAPILLARY: Glucose-Capillary: 81 mg/dL (ref 70–99)

## 2013-12-18 SURGERY — ANTERIOR CERVICAL DECOMPRESSION/DISCECTOMY FUSION 3 LEVELS
Anesthesia: General

## 2013-12-18 MED ORDER — ARTIFICIAL TEARS OP OINT
TOPICAL_OINTMENT | OPHTHALMIC | Status: AC
  Filled 2013-12-18: qty 3.5

## 2013-12-18 MED ORDER — BENAZEPRIL HCL 10 MG PO TABS
10.0000 mg | ORAL_TABLET | Freq: Every day | ORAL | Status: DC
  Administered 2013-12-18 – 2013-12-19 (×2): 10 mg via ORAL
  Filled 2013-12-18 (×3): qty 1

## 2013-12-18 MED ORDER — ZOLPIDEM TARTRATE 5 MG PO TABS: 5.0000 mg | ORAL_TABLET | Freq: Every evening | ORAL | Status: DC | PRN

## 2013-12-18 MED ORDER — LABETALOL HCL 5 MG/ML IV SOLN
INTRAVENOUS | Status: DC | PRN
  Administered 2013-12-18: 5 mg via INTRAVENOUS

## 2013-12-18 MED ORDER — CYCLOBENZAPRINE HCL 10 MG PO TABS
10.0000 mg | ORAL_TABLET | Freq: Three times a day (TID) | ORAL | Status: DC | PRN
  Administered 2013-12-18: 10 mg via ORAL
  Filled 2013-12-18: qty 1

## 2013-12-18 MED ORDER — METOCLOPRAMIDE HCL 5 MG/ML IJ SOLN
INTRAMUSCULAR | Status: DC | PRN
  Administered 2013-12-18: 10 mg via INTRAVENOUS

## 2013-12-18 MED ORDER — DEXTROSE 5 % IV SOLN
INTRAVENOUS | Status: DC | PRN
  Administered 2013-12-18: 07:00:00 via INTRAVENOUS

## 2013-12-18 MED ORDER — THROMBIN 20000 UNITS EX SOLR
CUTANEOUS | Status: DC | PRN
  Administered 2013-12-18: 09:00:00 via TOPICAL

## 2013-12-18 MED ORDER — NEOSTIGMINE METHYLSULFATE 10 MG/10ML IV SOLN
INTRAVENOUS | Status: AC
Start: 2013-12-18 — End: 2013-12-18
  Filled 2013-12-18: qty 1

## 2013-12-18 MED ORDER — ACETAMINOPHEN 10 MG/ML IV SOLN
INTRAVENOUS | Status: DC | PRN
  Administered 2013-12-18: 1000 mg via INTRAVENOUS

## 2013-12-18 MED ORDER — MAGNESIUM HYDROXIDE 400 MG/5ML PO SUSP: 30.0000 mL | Freq: Every day | ORAL | Status: DC | PRN

## 2013-12-18 MED ORDER — FENTANYL CITRATE 0.05 MG/ML IJ SOLN
INTRAMUSCULAR | Status: AC
  Filled 2013-12-18: qty 5

## 2013-12-18 MED ORDER — METOCLOPRAMIDE HCL 5 MG/ML IJ SOLN: 10.0000 mg | Freq: Once | INTRAMUSCULAR | Status: DC | PRN

## 2013-12-18 MED ORDER — ONDANSETRON HCL 4 MG/2ML IJ SOLN: 4.0000 mg | Freq: Four times a day (QID) | INTRAMUSCULAR | Status: DC | PRN

## 2013-12-18 MED ORDER — NEOSTIGMINE METHYLSULFATE 10 MG/10ML IV SOLN
INTRAVENOUS | Status: DC | PRN
  Administered 2013-12-18: 5 mg via INTRAVENOUS

## 2013-12-18 MED ORDER — KETOROLAC TROMETHAMINE 30 MG/ML IJ SOLN
INTRAMUSCULAR | Status: AC
  Filled 2013-12-18: qty 1

## 2013-12-18 MED ORDER — SODIUM CHLORIDE 0.9 % IR SOLN
Status: DC | PRN
  Administered 2013-12-18: 09:00:00

## 2013-12-18 MED ORDER — LABETALOL HCL 5 MG/ML IV SOLN
10.0000 mg | INTRAVENOUS | Status: DC | PRN
  Administered 2013-12-18 – 2013-12-19 (×2): 10 mg via INTRAVENOUS
  Filled 2013-12-18: qty 4

## 2013-12-18 MED ORDER — STERILE WATER FOR INJECTION IJ SOLN
INTRAMUSCULAR | Status: AC
  Filled 2013-12-18: qty 10

## 2013-12-18 MED ORDER — MENTHOL 3 MG MT LOZG: 1.0000 | LOZENGE | OROMUCOSAL | Status: DC | PRN

## 2013-12-18 MED ORDER — HYDROMORPHONE HCL PF 1 MG/ML IJ SOLN
INTRAMUSCULAR | Status: AC
  Filled 2013-12-18: qty 1

## 2013-12-18 MED ORDER — LIDOCAINE HCL (CARDIAC) 20 MG/ML IV SOLN
INTRAVENOUS | Status: DC | PRN
Start: 2013-12-18 — End: 2013-12-18
  Administered 2013-12-18 (×2): 50 mg via INTRAVENOUS
  Administered 2013-12-18: 80 mg via INTRAVENOUS

## 2013-12-18 MED ORDER — SODIUM CHLORIDE 0.9 % IJ SOLN
3.0000 mL | Freq: Two times a day (BID) | INTRAMUSCULAR | Status: DC
  Administered 2013-12-18: 3 mL via INTRAVENOUS

## 2013-12-18 MED ORDER — DEXAMETHASONE SODIUM PHOSPHATE 4 MG/ML IJ SOLN
INTRAMUSCULAR | Status: AC
  Filled 2013-12-18: qty 2

## 2013-12-18 MED ORDER — OXYCODONE HCL 5 MG/5ML PO SOLN: 5.0000 mg | Freq: Once | ORAL | Status: DC | PRN

## 2013-12-18 MED ORDER — LABETALOL HCL 5 MG/ML IV SOLN
20.0000 mg | INTRAVENOUS | Status: DC | PRN
  Filled 2013-12-18 (×3): qty 4

## 2013-12-18 MED ORDER — LIDOCAINE HCL (CARDIAC) 20 MG/ML IV SOLN
INTRAVENOUS | Status: AC
  Filled 2013-12-18: qty 5

## 2013-12-18 MED ORDER — ROCURONIUM BROMIDE 100 MG/10ML IV SOLN
INTRAVENOUS | Status: DC | PRN
  Administered 2013-12-18: 50 mg via INTRAVENOUS

## 2013-12-18 MED ORDER — OXYCODONE HCL 5 MG PO TABS: 5.0000 mg | ORAL_TABLET | Freq: Once | ORAL | Status: DC | PRN

## 2013-12-18 MED ORDER — LACTATED RINGERS IV SOLN
INTRAVENOUS | Status: DC | PRN
  Administered 2013-12-18 (×3): via INTRAVENOUS

## 2013-12-18 MED ORDER — BISACODYL 10 MG RE SUPP: 10.0000 mg | Freq: Every day | RECTAL | Status: DC | PRN

## 2013-12-18 MED ORDER — LIDOCAINE HCL (CARDIAC) 20 MG/ML IV SOLN
INTRAVENOUS | Status: AC
  Filled 2013-12-18: qty 10

## 2013-12-18 MED ORDER — HYDROXYZINE HCL 25 MG PO TABS
50.0000 mg | ORAL_TABLET | ORAL | Status: DC | PRN
  Administered 2013-12-19: 50 mg via ORAL
  Filled 2013-12-18: qty 2

## 2013-12-18 MED ORDER — VECURONIUM BROMIDE 10 MG IV SOLR
INTRAVENOUS | Status: AC
  Filled 2013-12-18: qty 10

## 2013-12-18 MED ORDER — ESMOLOL HCL 100 MG/10ML IV SOLN
100.0000 mg | INTRAVENOUS | Status: AC
  Filled 2013-12-18: qty 10

## 2013-12-18 MED ORDER — KCL IN DEXTROSE-NACL 20-5-0.45 MEQ/L-%-% IV SOLN
INTRAVENOUS | Status: AC
  Filled 2013-12-18: qty 1000

## 2013-12-18 MED ORDER — MORPHINE SULFATE 4 MG/ML IJ SOLN: 4.0000 mg | INTRAMUSCULAR | Status: DC | PRN

## 2013-12-18 MED ORDER — PHENYLEPHRINE 40 MCG/ML (10ML) SYRINGE FOR IV PUSH (FOR BLOOD PRESSURE SUPPORT)
PREFILLED_SYRINGE | INTRAVENOUS | Status: AC
  Filled 2013-12-18: qty 10

## 2013-12-18 MED ORDER — OXYCODONE-ACETAMINOPHEN 5-325 MG PO TABS
1.0000 | ORAL_TABLET | ORAL | Status: DC | PRN
  Administered 2013-12-18 (×2): 2 via ORAL
  Filled 2013-12-18 (×2): qty 2

## 2013-12-18 MED ORDER — DEXAMETHASONE SODIUM PHOSPHATE 4 MG/ML IJ SOLN
INTRAMUSCULAR | Status: DC | PRN
  Administered 2013-12-18: 8 mg via INTRAVENOUS

## 2013-12-18 MED ORDER — KETOROLAC TROMETHAMINE 30 MG/ML IJ SOLN
30.0000 mg | Freq: Four times a day (QID) | INTRAMUSCULAR | Status: DC
  Administered 2013-12-18 – 2013-12-19 (×3): 30 mg via INTRAVENOUS
  Filled 2013-12-18 (×5): qty 1

## 2013-12-18 MED ORDER — ALUM & MAG HYDROXIDE-SIMETH 200-200-20 MG/5ML PO SUSP
30.0000 mL | Freq: Four times a day (QID) | ORAL | Status: DC | PRN
  Administered 2013-12-18: 30 mL via ORAL
  Filled 2013-12-18 (×2): qty 30

## 2013-12-18 MED ORDER — HYDROCODONE-ACETAMINOPHEN 5-325 MG PO TABS: 1.0000 | ORAL_TABLET | ORAL | Status: DC | PRN

## 2013-12-18 MED ORDER — LIDOCAINE-EPINEPHRINE 1 %-1:100000 IJ SOLN
INTRAMUSCULAR | Status: DC | PRN
  Administered 2013-12-18: 7.25 mL via INTRADERMAL

## 2013-12-18 MED ORDER — SODIUM CHLORIDE 0.9 % IJ SOLN: 3.0000 mL | INTRAMUSCULAR | Status: DC | PRN

## 2013-12-18 MED ORDER — ACETAMINOPHEN 10 MG/ML IV SOLN
INTRAVENOUS | Status: AC
  Filled 2013-12-18: qty 100

## 2013-12-18 MED ORDER — MIDAZOLAM HCL 2 MG/2ML IJ SOLN
INTRAMUSCULAR | Status: AC
  Filled 2013-12-18: qty 2

## 2013-12-18 MED ORDER — FENTANYL CITRATE 0.05 MG/ML IJ SOLN
INTRAMUSCULAR | Status: DC | PRN
  Administered 2013-12-18 (×3): 50 ug via INTRAVENOUS
  Administered 2013-12-18 (×2): 100 ug via INTRAVENOUS
  Administered 2013-12-18: 50 ug via INTRAVENOUS
  Administered 2013-12-18: 100 ug via INTRAVENOUS
  Administered 2013-12-18: 50 ug via INTRAVENOUS
  Administered 2013-12-18: 150 ug via INTRAVENOUS

## 2013-12-18 MED ORDER — ONDANSETRON HCL 4 MG/2ML IJ SOLN
INTRAMUSCULAR | Status: DC | PRN
  Administered 2013-12-18: 4 mg via INTRAVENOUS

## 2013-12-18 MED ORDER — VECURONIUM BROMIDE 10 MG IV SOLR
INTRAVENOUS | Status: AC
Start: 2013-12-18 — End: 2013-12-18
  Filled 2013-12-18: qty 10

## 2013-12-18 MED ORDER — GLYCOPYRROLATE 0.2 MG/ML IJ SOLN
INTRAMUSCULAR | Status: AC
  Filled 2013-12-18: qty 5

## 2013-12-18 MED ORDER — ARTIFICIAL TEARS OP OINT
TOPICAL_OINTMENT | OPHTHALMIC | Status: DC | PRN
  Administered 2013-12-18: 1 via OPHTHALMIC

## 2013-12-18 MED ORDER — GLYCOPYRROLATE 0.2 MG/ML IJ SOLN
INTRAMUSCULAR | Status: DC | PRN
  Administered 2013-12-18: 0.6 mg via INTRAVENOUS

## 2013-12-18 MED ORDER — PHENOL 1.4 % MT LIQD: 1.0000 | OROMUCOSAL | Status: DC | PRN

## 2013-12-18 MED ORDER — BUPIVACAINE HCL (PF) 0.5 % IJ SOLN
INTRAMUSCULAR | Status: DC | PRN
  Administered 2013-12-18: 7.25 mL

## 2013-12-18 MED ORDER — VECURONIUM BROMIDE 10 MG IV SOLR
INTRAVENOUS | Status: DC | PRN
  Administered 2013-12-18: 4 mg via INTRAVENOUS
  Administered 2013-12-18: 2 mg via INTRAVENOUS

## 2013-12-18 MED ORDER — THROMBIN 5000 UNITS EX SOLR
OROMUCOSAL | Status: DC | PRN
  Administered 2013-12-18: 09:00:00 via TOPICAL

## 2013-12-18 MED ORDER — 0.9 % SODIUM CHLORIDE (POUR BTL) OPTIME
TOPICAL | Status: DC | PRN
  Administered 2013-12-18: 1000 mL

## 2013-12-18 MED ORDER — ACETAMINOPHEN 325 MG PO TABS: 650.0000 mg | ORAL_TABLET | ORAL | Status: DC | PRN

## 2013-12-18 MED ORDER — KETOROLAC TROMETHAMINE 30 MG/ML IJ SOLN
30.0000 mg | Freq: Once | INTRAMUSCULAR | Status: AC
  Administered 2013-12-18: 30 mg via INTRAVENOUS

## 2013-12-18 MED ORDER — SODIUM CHLORIDE 0.9 % IV SOLN: 250.0000 mL | INTRAVENOUS | Status: DC

## 2013-12-18 MED ORDER — ZOLPIDEM TARTRATE 5 MG PO TABS: 10.0000 mg | ORAL_TABLET | Freq: Every evening | ORAL | Status: DC | PRN

## 2013-12-18 MED ORDER — ACETAMINOPHEN 650 MG RE SUPP: 650.0000 mg | RECTAL | Status: DC | PRN

## 2013-12-18 MED ORDER — METOCLOPRAMIDE HCL 5 MG/ML IJ SOLN
INTRAMUSCULAR | Status: AC
  Filled 2013-12-18: qty 2

## 2013-12-18 MED ORDER — HYDROMORPHONE HCL PF 1 MG/ML IJ SOLN
0.2500 mg | INTRAMUSCULAR | Status: DC | PRN
  Administered 2013-12-18 (×2): 0.25 mg via INTRAVENOUS

## 2013-12-18 MED ORDER — MIDAZOLAM HCL 5 MG/5ML IJ SOLN
INTRAMUSCULAR | Status: DC | PRN
  Administered 2013-12-18: 2 mg via INTRAVENOUS

## 2013-12-18 MED ORDER — KCL IN DEXTROSE-NACL 20-5-0.45 MEQ/L-%-% IV SOLN
INTRAVENOUS | Status: DC
  Administered 2013-12-18: 13:00:00 via INTRAVENOUS
  Filled 2013-12-18 (×4): qty 1000

## 2013-12-18 MED ORDER — PROPOFOL 10 MG/ML IV BOLUS
INTRAVENOUS | Status: AC
  Filled 2013-12-18: qty 20

## 2013-12-18 MED ORDER — PROPOFOL 10 MG/ML IV BOLUS
INTRAVENOUS | Status: DC | PRN
  Administered 2013-12-18: 250 mg via INTRAVENOUS

## 2013-12-18 SURGICAL SUPPLY — 67 items
ADH SKN CLS APL DERMABOND .7 (GAUZE/BANDAGES/DRESSINGS) ×1
ALLOGRAFT 7X14X11 (Bone Implant) ×4 IMPLANT
BAG DECANTER FOR FLEXI CONT (MISCELLANEOUS) ×2 IMPLANT
BIT DRILL NEURO 2X3.1 SFT TUCH (MISCELLANEOUS) ×1 IMPLANT
BLADE 10 SAFETY STRL DISP (BLADE) ×2 IMPLANT
BLADE ULTRA TIP 2M (BLADE) ×2 IMPLANT
BRUSH SCRUB EZ PLAIN DRY (MISCELLANEOUS) ×2 IMPLANT
BUR ACRON 5.0MM COATED (BURR) ×2 IMPLANT
CANISTER SUCT 3000ML (MISCELLANEOUS) ×2 IMPLANT
CONT SPEC 4OZ CLIKSEAL STRL BL (MISCELLANEOUS) ×2 IMPLANT
COVER MAYO STAND STRL (DRAPES) ×2 IMPLANT
DECANTER SPIKE VIAL GLASS SM (MISCELLANEOUS) ×2 IMPLANT
DERMABOND ADVANCED (GAUZE/BANDAGES/DRESSINGS) ×1
DERMABOND ADVANCED .7 DNX12 (GAUZE/BANDAGES/DRESSINGS) ×1 IMPLANT
DRAPE LAPAROTOMY 100X72 PEDS (DRAPES) ×2 IMPLANT
DRAPE MICROSCOPE LEICA (MISCELLANEOUS) ×2 IMPLANT
DRAPE POUCH INSTRU U-SHP 10X18 (DRAPES) ×2 IMPLANT
DRAPE PROXIMA HALF (DRAPES) ×2 IMPLANT
DRILL NEURO 2X3.1 SOFT TOUCH (MISCELLANEOUS) ×2
ELECT COATED BLADE 2.86 ST (ELECTRODE) ×2 IMPLANT
ELECT REM PT RETURN 9FT ADLT (ELECTROSURGICAL) ×2
ELECTRODE REM PT RTRN 9FT ADLT (ELECTROSURGICAL) ×1 IMPLANT
GLOVE BIO SURGEON STRL SZ8 (GLOVE) ×2 IMPLANT
GLOVE BIOGEL PI IND STRL 7.0 (GLOVE) ×1 IMPLANT
GLOVE BIOGEL PI IND STRL 7.5 (GLOVE) ×1 IMPLANT
GLOVE BIOGEL PI IND STRL 8 (GLOVE) ×2 IMPLANT
GLOVE BIOGEL PI INDICATOR 7.0 (GLOVE) ×1
GLOVE BIOGEL PI INDICATOR 7.5 (GLOVE) ×1
GLOVE BIOGEL PI INDICATOR 8 (GLOVE) ×2
GLOVE ECLIPSE 7.0 STRL STRAW (GLOVE) ×4 IMPLANT
GLOVE ECLIPSE 7.5 STRL STRAW (GLOVE) ×6 IMPLANT
GLOVE EXAM NITRILE LRG STRL (GLOVE) IMPLANT
GLOVE EXAM NITRILE MD LF STRL (GLOVE) ×4 IMPLANT
GLOVE EXAM NITRILE XL STR (GLOVE) IMPLANT
GLOVE EXAM NITRILE XS STR PU (GLOVE) IMPLANT
GOWN BRE IMP SLV AUR LG STRL (GOWN DISPOSABLE) IMPLANT
GOWN BRE IMP SLV AUR XL STRL (GOWN DISPOSABLE) IMPLANT
GOWN STRL REIN 2XL LVL4 (GOWN DISPOSABLE) IMPLANT
GOWN STRL REUS W/ TWL XL LVL3 (GOWN DISPOSABLE) ×3 IMPLANT
GOWN STRL REUS W/TWL 2XL LVL3 (GOWN DISPOSABLE) ×2 IMPLANT
GOWN STRL REUS W/TWL XL LVL3 (GOWN DISPOSABLE) ×6
GRAFT CORT CANC 14X8.25X11 5D (Bone Implant) ×2 IMPLANT
HALTER HD/CHIN CERV TRACTION D (MISCELLANEOUS) ×2 IMPLANT
HEMOSTAT POWDER SURGIFOAM 1G (HEMOSTASIS) ×2 IMPLANT
KIT BASIN OR (CUSTOM PROCEDURE TRAY) ×2 IMPLANT
KIT ROOM TURNOVER OR (KITS) ×2 IMPLANT
NEEDLE HYPO 25X1 1.5 SAFETY (NEEDLE) ×2 IMPLANT
NEEDLE SPNL 22GX3.5 QUINCKE BK (NEEDLE) ×2 IMPLANT
NS IRRIG 1000ML POUR BTL (IV SOLUTION) ×2 IMPLANT
PACK LAMINECTOMY NEURO (CUSTOM PROCEDURE TRAY) ×2 IMPLANT
PAD ARMBOARD 7.5X6 YLW CONV (MISCELLANEOUS) ×6 IMPLANT
PLATE CERV 52MM 3LV (Plate) ×2 IMPLANT
RUBBERBAND STERILE (MISCELLANEOUS) ×4 IMPLANT
SCREW FIX 4.0X16MM (Screw) ×2 IMPLANT
SCREW VAR 4.0X16MM (Screw) ×10 IMPLANT
SPONGE INTESTINAL PEANUT (DISPOSABLE) ×2 IMPLANT
SPONGE SURGIFOAM ABS GEL 100 (HEMOSTASIS) ×2 IMPLANT
STAPLER SKIN PROX WIDE 3.9 (STAPLE) ×2 IMPLANT
SUT VIC AB 0 CT1 18XCR BRD8 (SUTURE) IMPLANT
SUT VIC AB 0 CT1 8-18 (SUTURE)
SUT VIC AB 2-0 CP2 18 (SUTURE) ×2 IMPLANT
SUT VIC AB 3-0 SH 8-18 (SUTURE) ×4 IMPLANT
SYR 20ML ECCENTRIC (SYRINGE) ×2 IMPLANT
TOWEL OR 17X24 6PK STRL BLUE (TOWEL DISPOSABLE) IMPLANT
TOWEL OR 17X26 10 PK STRL BLUE (TOWEL DISPOSABLE) ×2 IMPLANT
TRAY FOLEY CATH 16FRSI W/METER (SET/KITS/TRAYS/PACK) ×2 IMPLANT
WATER STERILE IRR 1000ML POUR (IV SOLUTION) ×2 IMPLANT

## 2013-12-18 NOTE — H&P (Signed)
Subjective: Patient is a 58 y.o. male who is admitted for treatment of advanced multilevel cervical spondylosis and degenerative disc disease with resulting neck and upper back pain extending to the shoulders bilaterally. Workup has revealed advanced degenerative changes at the C3-4, C4-5, and C5-6 levels, the remainder of the cervical levels are unremarkable. Patient is admitted now for a 3 level CIII-4, C4-5, C5-6 anterior cervical decompression arthrodesis with allograft and tether cervical plating    Patient Active Problem List   Diagnosis Date Noted  . Adjustment disorder 12/20/2011   Past Medical History  Diagnosis Date  . Back pain   . Diabetes mellitus without complication     pt denies  . Muscle spasm     takes Flexeril daily  . Shortness of breath     with exertion  . History of bronchitis   . Headache(784.0)     related to neck issues  . Weakness     and numbness in left fingers  . Arthritis   . Joint pain   . GERD (gastroesophageal reflux disease)     using Baking Soda and Water per pt  . History of gastric ulcer 20+yrs ago  . Cataracts, bilateral     immature  . Hypertension     takes Benazepril some days    Past Surgical History  Procedure Laterality Date  . Shoulder surgery Right     x 3   . Arm surgery Left   . Joint replacement Right     hip     Prescriptions prior to admission  Medication Sig Dispense Refill  . benazepril (LOTENSIN) 10 MG tablet Take 1 tablet (10 mg total) by mouth daily.  20 tablet  0  . cyclobenzaprine (FLEXERIL) 5 MG tablet Take 7.5 mg by mouth 3 (three) times daily as needed for muscle spasms.      Marland Kitchen dexamethasone (DECADRON) 6 MG tablet Take 6 mg by mouth 2 (two) times daily with a meal.      . diclofenac (VOLTAREN) 75 MG EC tablet Take 75 mg by mouth 2 (two) times daily.       No Known Allergies  History  Substance Use Topics  . Smoking status: Current Every Day Smoker -- 0.50 packs/day for 30 years    Types: Cigarettes  .  Smokeless tobacco: Not on file  . Alcohol Use: Yes     Comment: nothing in 2-3 wks    No family history on file.   Review of Systems A comprehensive review of systems was negative.  Objective: Vital signs in last 24 hours: Temp:  [97.5 F (36.4 C)] 97.5 F (36.4 C) (05/14 0603) Pulse Rate:  [66] 66 (05/14 0603) BP: (160)/(105) 160/105 mmHg (05/14 0603) SpO2:  [100 %] 100 % (05/14 0603) Weight:  [77.701 kg (171 lb 4.8 oz)] 77.701 kg (171 lb 4.8 oz) (05/14 0603)  EXAM: Patient well-developed well-nourished male in no acute distress. Lungs are clear to auscultation , the patient has symmetrical respiratory excursion. Heart has a regular rate and rhythm normal S1 and S2 no murmur.   Abdomen is soft nontender nondistended bowel sounds are present. Extremity examination shows no clubbing cyanosis or edema. Motor examination shows 5 over 5 strength in the upper extremities including the deltoid biceps triceps and intrinsics and grip. Sensation is intact to pinprick throughout the digits of the upper extremities. Reflexes are symmetrical and without evidence of pathologic reflexes. Patient has a normal gait and stance.   Data Review:CBC  Component Value Date/Time   WBC 7.9 12/12/2013 1111   RBC 4.69 12/12/2013 1111   HGB 15.3 12/12/2013 1111   HCT 43.6 12/12/2013 1111   PLT 200 12/12/2013 1111   MCV 93.0 12/12/2013 1111   MCH 32.6 12/12/2013 1111   MCHC 35.1 12/12/2013 1111   RDW 12.4 12/12/2013 1111   LYMPHSABS 2.3 06/01/2013 0945   MONOABS 0.9 06/01/2013 0945   EOSABS 0.1 06/01/2013 0945   BASOSABS 0.0 06/01/2013 0945                          BMET    Component Value Date/Time   NA 142 12/12/2013 1111   K 4.2 12/12/2013 1111   CL 105 12/12/2013 1111   CO2 25 12/12/2013 1111   GLUCOSE 86 12/12/2013 1111   BUN 10 12/12/2013 1111   CREATININE 1.16 12/12/2013 1111   CALCIUM 9.3 12/12/2013 1111   GFRNONAA 68* 12/12/2013 1111   GFRAA 79* 12/12/2013 1111     Assessment/Plan: Patient with neck and upper back  pain, extending to the shoulders bilaterally. Patient is admitted for three-level ACDF.  I've discussed with the patient the nature of his condition, the nature the surgical procedure, the typical length of surgery, hospital stay, and overall recuperation. We discussed limitations postoperatively. I discussed risks of surgery including risks of infection, bleeding, possibly need for transfusion, the risk of nerve root dysfunction with pain, weakness, numbness, or paresthesias, the risk of spinal cord dysfunction with paralysis of all 4 limbs and quadriplegia, and the risk of dural tear and CSF leakage and possible need for further surgery, the risk of esophageal dysfunction causing dysphagia and the risk of laryngeal dysfunction causing hoarseness of the voice, the risk of failure of the arthrodesis and the possible need for further surgery, and the risk of anesthetic complications including myocardial infarction, stroke, pneumonia, and death. We also discussed the need for postoperative immobilization in a cervical collar. Understanding all this the patient does wish to proceed with surgery and is admitted for such.    Hosie Spangle, MD 12/18/2013 7:06 AM

## 2013-12-18 NOTE — Progress Notes (Signed)
Utilization review completed.  

## 2013-12-18 NOTE — Progress Notes (Signed)
Filed Vitals:   12/18/13 1243 12/18/13 1245 12/18/13 1309 12/18/13 1653  BP:   153/92 163/88  Pulse: 73 79 76 78  Temp: 98 F (36.7 C)  97.4 F (36.3 C) 98.5 F (36.9 C)  Resp: 19 23 16 16   Weight:      SpO2: 97% 97% 93% 96%    Patient resting in bed, doing well. Comfortable. Wound clean and dry. Moving all 4 extremities well. Voiding. Ambulating in the halls.  Plan: Doing well following surgery. Encouraged to ambulate.  Hosie Spangle, MD 12/18/2013, 5:47 PM

## 2013-12-18 NOTE — Anesthesia Postprocedure Evaluation (Signed)
Anesthesia Post Note  Patient: Stephen Sullivan  Procedure(s) Performed: Procedure(s) (LRB): ANTERIOR CERVICAL DECOMPRESSION/DISCECTOMY FUSION CERVICAL THREE-FOUR, FOUR-FIVE, FIVE-SIX W/ BONEGRAFT (N/A)  Anesthesia type: General  Patient location: PACU  Post pain: Pain level controlled  Post assessment: Patient's Cardiovascular Status Stable  Last Vitals:  Filed Vitals:   12/18/13 1130  BP: 162/95  Pulse: 71  Temp:   Resp: 21    Post vital signs: Reviewed and stable  Level of consciousness: alert  Complications: No apparent anesthesia complications

## 2013-12-18 NOTE — Transfer of Care (Signed)
Immediate Anesthesia Transfer of Care Note  Patient: Stephen Sullivan  Procedure(s) Performed: Procedure(s) with comments: ANTERIOR CERVICAL DECOMPRESSION/DISCECTOMY FUSION CERVICAL THREE-FOUR, FOUR-FIVE, FIVE-SIX W/ BONEGRAFT (N/A) - ANTERIOR CERVICAL DECOMPRESSION/DISCECTOMY FUSION CERVICAL THREE-FOUR, FOUR-FIVE, FIVE-SIX W/ BONEGRAFT  Patient Location: PACU  Anesthesia Type:General  Level of Consciousness: oriented, sedated, patient cooperative and responds to stimulation  Airway & Oxygen Therapy: Patient Spontanous Breathing and Patient connected to nasal cannula oxygen  Post-op Assessment: Report given to PACU RN, Post -op Vital signs reviewed and stable, Patient moving all extremities and Patient moving all extremities X 4  Post vital signs: Reviewed and stable  Complications: No apparent anesthesia complications

## 2013-12-18 NOTE — Anesthesia Preprocedure Evaluation (Signed)
Anesthesia Evaluation  Patient identified by MRN, date of birth, ID band Patient awake    Reviewed: Allergy & Precautions, H&P , NPO status , Patient's Chart, lab work & pertinent test results, reviewed documented beta blocker date and time   Airway Mallampati: II TM Distance: >3 FB Neck ROM: full    Dental   Pulmonary shortness of breath, Current Smoker,  breath sounds clear to auscultation        Cardiovascular hypertension, On Medications Rhythm:regular     Neuro/Psych  Headaches, PSYCHIATRIC DISORDERS    GI/Hepatic Neg liver ROS, GERD-  Medicated and Controlled,(+)     substance abuse  alcohol use, cocaine use and marijuana use,   Endo/Other  diabetes  Renal/GU negative Renal ROS  negative genitourinary   Musculoskeletal   Abdominal   Peds  Hematology negative hematology ROS (+)   Anesthesia Other Findings See surgeon's H&P   Reproductive/Obstetrics negative OB ROS                           Anesthesia Physical Anesthesia Plan  ASA: III  Anesthesia Plan: General   Post-op Pain Management:    Induction: Intravenous  Airway Management Planned: Oral ETT  Additional Equipment:   Intra-op Plan:   Post-operative Plan: Extubation in OR  Informed Consent: I have reviewed the patients History and Physical, chart, labs and discussed the procedure including the risks, benefits and alternatives for the proposed anesthesia with the patient or authorized representative who has indicated his/her understanding and acceptance.   Dental Advisory Given  Plan Discussed with: CRNA and Surgeon  Anesthesia Plan Comments:         Anesthesia Quick Evaluation

## 2013-12-18 NOTE — Anesthesia Procedure Notes (Signed)
Procedure Name: Intubation Date/Time: 12/18/2013 7:44 AM Performed by: Jacquiline Doe A Pre-anesthesia Checklist: Patient identified, Timeout performed, Emergency Drugs available, Patient being monitored and Suction available Patient Re-evaluated:Patient Re-evaluated prior to inductionOxygen Delivery Method: Circle system utilized Preoxygenation: Pre-oxygenation with 100% oxygen Intubation Type: IV induction and Cricoid Pressure applied Ventilation: Mask ventilation without difficulty Laryngoscope Size: Mac Grade View: Grade I Tube type: Oral Tube size: 8.0 mm Number of attempts: 1 Airway Equipment and Method: Stylet and Video-laryngoscopy Placement Confirmation: ETT inserted through vocal cords under direct vision,  breath sounds checked- equal and bilateral and positive ETCO2 Secured at: 23 cm Tube secured with: Tape Dental Injury: Teeth and Oropharynx as per pre-operative assessment

## 2013-12-18 NOTE — Op Note (Signed)
12/18/2013  10:38 AM  PATIENT:  Stephen Sullivan  58 y.o. male  PRE-OPERATIVE DIAGNOSIS: Multilevel cervical herniated disc cervical spondylosis cervical degenerative disc disease neck pain  POST-OPERATIVE DIAGNOSIS:  Multilevel cervical herniated disc cervical spondylosis cervical degenerative disc disease neck pain  PROCEDURE:  Procedure(s): ANTERIOR CERVICAL DECOMPRESSION/DISCECTOMY FUSION CERVICAL THREE-FOUR, FOUR-FIVE, FIVE-SIX W/ BONEGRAFT C3-4, C4-5, and C5-6 anterior cervical decompression and arthrodesis with allograft and tether cervical plating  SURGEON:  Surgeon(s): Hosie Spangle, MD Eustace Moore, MD  ASSISTANTS: Eustace Moore, M.D.  ANESTHESIA:   general  EBL:  Total I/O In: 2050 [I.V.:2050] Out: 500 [Urine:500]  BLOOD ADMINISTERED:none  COUNT: Correct per nursing staff  DICTATION: Patient was brought to the operating room placed under general endotracheal anesthesia. Patient was placed in 10 pounds of halter traction. The neck was prepped with Betadine soap and solution and draped in a sterile fashion. A obliquel incision was made on the left side of the neck paralleling the anterior border of the sternocleidomastoid.. The line of the incision was infiltrated with local anesthetic with epinephrine. Dissection was carried down thru the subcutaneous tissue and platysma, bipolar cautery was used to maintain hemostasis. Dissection was then carried out thru an avascular plane leaving the sternocleidomastoid carotid artery and jugular vein laterally and the trachea and esophagus medially. The ventral aspect of the vertebral column was identified and a localizing x-ray was taken. The C3-4, C4-5, and C5-6 levels were identified. The annulus at each level was incised and the disc space entered. Discectomy was performed with micro-curettes and pituitary rongeurs. The operating microscope was draped and brought into the field provided additional magnification illumination and  visualization. Discectomy was continued posteriorly thru the disc space and then the cartilaginous endplate was removed using micro-curettes along with the high-speed drill. Posterior osteophytic overgrowth was removed at each level using the high-speed drill along with a 2 mm thin footplated Kerrison punch. Posterior longitudinal ligament along with disc herniation was carefully removed, decompressing the spinal canal and thecal sac. We then continued to remove osteophytic overgrowth and disc material decompressing the neural foramina and exiting nerve roots bilaterally. Once the decompression was completed hemostasis was established at each level with the use of Gelfoam with thrombin and bipolar cautery. The Gelfoam was removed the wound irrigated and hemostasis confirmed. We then measured the height of each intravertebral disc space level and selected a 7 millimeter in height structural allograft for the C3-4 level, a 8 millimeter in height structural allograft for the C4-5 level, and a 7 millimeter in height structural allograft for the C5-6 level . Each was hydrated in saline solution and then gently positioned in the intravertebral disc space and countersunk. We then selected a 52 millimeter in height Tether cervical plate. It was positioned over the fusion construct and secured to the vertebra with 4 x 16 mm screws. Each screw hole was started with the high-speed drill and then the screws placed, once all the screws were placed final tightening was performed. The wound was irrigated with bacitracin solution checked for hemostasis which was established and confirmed. An x-ray was taken which showed grafts in good position, the plate and screws in good position, and the overall alignment looked good. We then proceeded with closure. The platysma was closed with interrupted inverted 2-0 undyed Vicryl suture, the subcutaneous and subcuticular closed with interrupted inverted 3-0 undyed Vicryl suture. The skin  edges were approximated with Dermabond. Following surgery the patient was taken out of cervical traction. To  be reversed and the anesthetic and taken to the recovery room for further care.  PLAN OF CARE: Admit to inpatient   PATIENT DISPOSITION:  PACU - hemodynamically stable.   Delay start of Pharmacological VTE agent (>24hrs) due to surgical blood loss or risk of bleeding:  yes

## 2013-12-19 MED ORDER — OXYCODONE-ACETAMINOPHEN 5-325 MG PO TABS: 1.0000 | ORAL_TABLET | ORAL | Status: DC | PRN

## 2013-12-19 NOTE — Discharge Instructions (Signed)
Wound Care Leave incision open to air. You may shower. Do not scrub directly on incision.  Do not put any creams, lotions, or ointments on incision. Activity Walk each and every day, increasing distance each day. No lifting greater than 5 lbs.  Avoid excessive neck motion. No driving for 2 weeks; may ride as a passenger locally. Wear neck brace at all times except when showering.  If provided soft collar, may wear for comfort unless otherwise instructed. Diet Resume your normal diet.  Return to Work Will be discussed at you follow up appointment. Call Your Doctor If Any of These Occur Redness, drainage, or swelling at the wound.  Temperature greater than 101 degrees. Severe pain not relieved by pain medication. Increased difficulty swallowing. Incision starts to come apart. Follow Up Appt Call today for appointment in 3 weeks ([phone]) or for problems.  If you have any hardware placed in your spine, you will need an x-ray before your appointment.    Anterior Cervical Diskectomy and Fusion Anterior cervical diskectomy is surgery done on the upper spine to relieve pressure on one or more nerve roots, or on the spinal cord. There are 7 bones in your neck, called the cervical spine. These 7 bones (vertebrae) sit one on top of the other. Cushions (intervertebral disks) separate the vertebrae and act like shock absorbers. As we age, degeneration of our bones, joints, and disks can cause neck pain and tightening around the spinal cord and nerve roots. This causes arm pain and weakness.  Degeneration involves:  Herniated Disk. With age, the disks dry up and can rupture. In this condition, the center of the disk bulges out (disk herniation). This can cause pressure on a nerve, which produces pain or weakness in the arm.  Bone spurs and spinal stenosis. As we age, growths often develop on our bones. These growths are called bone spurs (osteophytes). A bone spur is a collection of calcium. As  bone spurs grow and extend, the vertebral openings become narrow. The spinal canal and/or the foramen (opening for nerve passageways) become smaller. This narrowing (stenosis) may cause pinching (compression) of the spinal cord or the spinal nerve root. The nerve injury can cause pain, weakness, numbness, and loss of coordination in the upper limbs. Often, patients have difficulty with their hand writing or they start dropping things, because their hand grip is weaker. The spinal cord damage can cause increased stiffness, more frequent falls, electric shooting pain, and changes in bowel and bladder control. Degeneration in the neck results in three common problems:  Radiculopathy - Nerve compression that results in weakness or pain that radiates down the arm.  Myelopathy - Spinal cord compression that causes stiffness, difficulty with walking, coordination, and trouble with bowel or bladder habits.  Neck pain - Worn out joints cause pain as the neck moves. Treatment:  Radiculopathy - Surgery is performed to remove the bony and disk material that is pushing on the nerve.  Myelopathy - Surgery is performed to remove the bony and disk material that pushes on the spinal cord.  Neck pain - Surgery is performed to combine (fuse) the joints of the neck together, so they cannot move or cause pain. Surgery can be done from the front or the back of the neck. When it is done from the front, it is called an anterior (front) cervical (neck) diskectomy (removal of the disk) and fusion. LET YOUR CAREGIVER KNOW ABOUT:   Recent infections.  Any shooting pains down your leg, when you  move your neck.  Any difficulty swallowing.  A smoking history.  Use of blood thinners or anti-inflammatory medicines.  Any history of injury to your shoulders.  Any history of injury to your vocal cords.  Any foreign objects in your body from a previous surgery.  Any recent fevers or illness.  Past medical history  (diabetes, strokes).  Past problems with anesthetics.  Possibility of pregnancy.  History of blood clots (deep vein thrombosis).  History of bleeding or blood problems.  Past surgeries.  Other health problems.  Allergies.  Medicines you take, including herbs, eye drops, over-the-counter medicines, and creams.  Use of steroids (by mouth or creams). RISKS AND COMPLICATIONS  Infection.  Bleeding.  Injury to the following structures:  Carotid artery. This can result in a stroke or significant amount of bleeding.  Esophagus, resulting in difficulty swallowing.  Recurrent laryngeal nerve, resulting in hoarseness of the voice.  Spinal cord injury, ranging from mild to complete quadriparesis (muscle weakness in all four limbs).  Nerve root injury, resulting in muscle weakness in the upper limb.  Leakage of cerebrospinal fluid. BEFORE THE PROCEDURE   You will be given medicine to help you sleep (general anesthetic), and a breathing tube will be placed.  You will be given antibiotics to keep the infection rate down.  The incision site on your neck will be marked.  Your neck will be cleaned, to reduce the risk of infection. PROCEDURE  An anterior cervical fusion means that the operation is done through the front (anterior) part of your neck. The cut made by the surgeon (incision) is usually within a skin fold line on the neck. After pushing aside the neck muscles, the surgeon removes the affected, degenerated disk and bone spurs (osteophytes), which takes the pressure off the nerves and spinal cord. This is called a decompression. The area where the disk was removed is then filled with a small piece of plastic. This plastic takes the place of the disk and keeps the nerve passageway (foramen) open and clear for the nerves. In most cases, the surgeon uses metal plates or pins (hardware) in the neck, to help stabilize the level being fused. The hardware reduces motion at that level,  so it can fuse. This provides extra support to the neck. A cervical fusion procedure takes anywhere from a couple to several hours, depending on the size of the neck, history of previous surgery, and number of levels being fused. AFTER THE PROCEDURE   You will likely spend 24 48 hours in the hospital. During this time, your caregivers will look for any signs of complications from the procedure.  Your caregiver will watch you, to make sure that fluid draining from the surgery slows down. It is important that a large mass of blood does not form in your neck, which would cause difficulty with breathing.  You will get 24 hours of antibiotics.  You can start to eat as soon as you feel comfortable.  Once you have started eating, walking, urinating (voiding) and having bowel movements on your own, your caregiver will discharge you home. HOME CARE INSTRUCTIONS   For 2 weeks, do not soak the incision site under water. Do not swim or take baths. Showers are okay, but rinse off the incision sites.  Do not over exert yourself. Allow time for the incision to heal.  It can take from 6 weeks to 6 months for fusion to take effect. Your caregiver may ask you to wear a neck collar during  this time, as they check the fusion with multiple (serial) X-rays. Document Released: 07/12/2009 Document Revised: 11/18/2012 Document Reviewed: 07/12/2009 [facility] Patient Information 2014 [place], [place].

## 2013-12-19 NOTE — Progress Notes (Signed)
Pt doing well. Pt given D/C instructions with Rx, verbal understanding of teaching was given. Pt's IV was removed prior to D/C. Pt D/C'd home via walking @ 0830 per MD order. Pt is stable @ D/C and has no other needs. Holli Humbles, RN

## 2013-12-19 NOTE — Discharge Summary (Signed)
Physician Discharge Summary  Patient ID: Stephen Sullivan MRN: [id number] DOB/AGE: 09-25-1955 58 y.o.  Admit date: 12/18/2013 Discharge date: 12/19/2013  Admission Diagnoses:  Multilevel cervical herniated disc cervical spondylosis cervical degenerative disc disease neck pain  Discharge Diagnoses:  Multilevel cervical herniated disc cervical spondylosis cervical degenerative disc disease neck pain  Active Problems:   Cervical spondylosis   Discharged Condition: good  Hospital Course: Patient was admitted, underwent a 3 level CIII-4, C4-5, and C5-6 anterior cervical decompression and arthrodesis with allograft and tether cervical plating. He is done well following surgery. He is up and ambulate actively. His blood pressure has been elevated, and we have strongly advised him that he needs to followup with his primary physician within one week to have his blood pressure reassessed.  We are discharging him to home with instructions regarding wound care and activities. He is to return for followup with me in 3 weeks.  Discharge Exam: Blood pressure 175/98, pulse 70, temperature 98 F (36.7 C), temperature source Oral, resp. rate 18, weight 77.701 kg (171 lb 4.8 oz), SpO2 96.00%.  Disposition: Home     Medication List         benazepril 10 MG tablet  Commonly known as:  LOTENSIN  Take 1 tablet (10 mg total) by mouth daily.     cyclobenzaprine 5 MG tablet  Commonly known as:  FLEXERIL  Take 7.5 mg by mouth 3 (three) times daily as needed for muscle spasms.     dexamethasone 6 MG tablet  Commonly known as:  DECADRON  Take 6 mg by mouth 2 (two) times daily with a meal.     diclofenac 75 MG EC tablet  Commonly known as:  VOLTAREN  Take 75 mg by mouth 2 (two) times daily.     oxyCODONE-acetaminophen 5-325 MG per tablet  Commonly known as:  PERCOCET/ROXICET  Take 1-2 tablets by mouth every 4 (four) hours as needed for moderate pain.         Signed: Hosie Spangle,  MD 12/19/2013, 7:38 AM

## 2013-12-23 ENCOUNTER — Encounter (HOSPITAL_COMMUNITY): Payer: Self-pay | Admitting: Neurosurgery

## 2014-01-30 NOTE — OR Nursing (Signed)
Addendum to scope page 

## 2014-10-13 DIAGNOSIS — Z981 Arthrodesis status: Secondary | ICD-10-CM | POA: Insufficient documentation

## 2015-05-20 ENCOUNTER — Emergency Department (HOSPITAL_COMMUNITY): Payer: Medicare Other

## 2015-05-20 ENCOUNTER — Encounter (HOSPITAL_COMMUNITY): Payer: Self-pay | Admitting: *Deleted

## 2015-05-20 ENCOUNTER — Emergency Department (HOSPITAL_COMMUNITY)
Admission: EM | Admit: 2015-05-20 | Discharge: 2015-05-20 | Disposition: A | Discharge status: Home / Self Care | Payer: Medicare Other | Attending: Emergency Medicine | Admitting: Emergency Medicine

## 2015-05-20 DIAGNOSIS — Z720 Tobacco use: Secondary | ICD-10-CM | POA: Insufficient documentation

## 2015-05-20 DIAGNOSIS — Z79899 Other long term (current) drug therapy: Secondary | ICD-10-CM | POA: Diagnosis not present

## 2015-05-20 DIAGNOSIS — I10 Essential (primary) hypertension: Secondary | ICD-10-CM | POA: Diagnosis not present

## 2015-05-20 DIAGNOSIS — Z8719 Personal history of other diseases of the digestive system: Secondary | ICD-10-CM | POA: Insufficient documentation

## 2015-05-20 DIAGNOSIS — M5431 Sciatica, right side: Secondary | ICD-10-CM | POA: Insufficient documentation

## 2015-05-20 DIAGNOSIS — M1990 Unspecified osteoarthritis, unspecified site: Secondary | ICD-10-CM | POA: Insufficient documentation

## 2015-05-20 DIAGNOSIS — Z8709 Personal history of other diseases of the respiratory system: Secondary | ICD-10-CM | POA: Insufficient documentation

## 2015-05-20 DIAGNOSIS — E119 Type 2 diabetes mellitus without complications: Secondary | ICD-10-CM | POA: Diagnosis not present

## 2015-05-20 DIAGNOSIS — Z791 Long term (current) use of non-steroidal anti-inflammatories (NSAID): Secondary | ICD-10-CM | POA: Diagnosis not present

## 2015-05-20 DIAGNOSIS — H269 Unspecified cataract: Secondary | ICD-10-CM | POA: Insufficient documentation

## 2015-05-20 DIAGNOSIS — M25551 Pain in right hip: Secondary | ICD-10-CM | POA: Diagnosis present

## 2015-05-20 MED ORDER — DICLOFENAC SODIUM 75 MG PO TBEC: 75.0000 mg | DELAYED_RELEASE_TABLET | Freq: Two times a day (BID) | ORAL | Status: DC

## 2015-05-20 MED ORDER — PREDNISONE 10 MG PO TABS: ORAL_TABLET | ORAL | Status: DC

## 2015-05-20 MED ORDER — BENAZEPRIL HCL 10 MG PO TABS: 10.0000 mg | ORAL_TABLET | Freq: Every day | ORAL | Status: DC

## 2015-05-20 MED ORDER — KETOROLAC TROMETHAMINE 60 MG/2ML IM SOLN
60.0000 mg | Freq: Once | INTRAMUSCULAR | Status: AC
  Administered 2015-05-20: 60 mg via INTRAMUSCULAR
  Filled 2015-05-20: qty 2

## 2015-05-20 MED ORDER — HYDROMORPHONE HCL 1 MG/ML IJ SOLN
1.0000 mg | Freq: Once | INTRAMUSCULAR | Status: AC
  Administered 2015-05-20: 1 mg via INTRAMUSCULAR
  Filled 2015-05-20: qty 1

## 2015-05-20 MED ORDER — METHYLPREDNISOLONE SODIUM SUCC 125 MG IJ SOLR
125.0000 mg | Freq: Once | INTRAMUSCULAR | Status: AC
  Administered 2015-05-20: 125 mg via INTRAMUSCULAR
  Filled 2015-05-20: qty 2

## 2015-05-20 NOTE — ED Notes (Addendum)
Right hip pain began yesterday and became much worse last night. States pain is worse with coughing. States pain to right flank last night. States right hip feels "like it's on fire". Pt is hypertensive today, stating he has not had BP med in over a year due to lack of PCP.

## 2015-05-20 NOTE — Discharge Instructions (Signed)
Hypertension Hypertension, commonly called high blood pressure, is when the force of blood pumping through your arteries is too strong. Your arteries are the blood vessels that carry blood from your heart throughout your body. A blood pressure reading consists of a higher number over a lower number, such as 110/72. The higher number (systolic) is the pressure inside your arteries when your heart pumps. The lower number (diastolic) is the pressure inside your arteries when your heart relaxes. Ideally you want your blood pressure below 120/80. Hypertension forces your heart to work harder to pump blood. Your arteries may become narrow or stiff. Having untreated or uncontrolled hypertension can cause heart attack, stroke, kidney disease, and other problems. RISK FACTORS Some risk factors for high blood pressure are controllable. Others are not.  Risk factors you cannot control include:   Race. You may be at higher risk if you are African American.  Age. Risk increases with age.  Gender. Men are at higher risk than women before age 45 years. After age 65, women are at higher risk than men. Risk factors you can control include:  Not getting enough exercise or physical activity.  Being overweight.  Getting too much fat, sugar, calories, or salt in your diet.  Drinking too much alcohol. SIGNS AND SYMPTOMS Hypertension does not usually cause signs or symptoms. Extremely high blood pressure (hypertensive crisis) may cause headache, anxiety, shortness of breath, and nosebleed. DIAGNOSIS To check if you have hypertension, your health care provider will measure your blood pressure while you are seated, with your arm held at the level of your heart. It should be measured at least twice using the same arm. Certain conditions can cause a difference in blood pressure between your right and left arms. A blood pressure reading that is higher than normal on one occasion does not mean that you need treatment. If  it is not clear whether you have high blood pressure, you may be asked to return on a different day to have your blood pressure checked again. Or, you may be asked to monitor your blood pressure at home for 1 or more weeks. TREATMENT Treating high blood pressure includes making lifestyle changes and possibly taking medicine. Living a healthy lifestyle can help lower high blood pressure. You may need to change some of your habits. Lifestyle changes may include:  Following the DASH diet. This diet is high in fruits, vegetables, and whole grains. It is low in salt, red meat, and added sugars.  Keep your sodium intake below 2,300 mg per day.  Getting at least 30-45 minutes of aerobic exercise at least 4 times per week.  Losing weight if necessary.  Not smoking.  Limiting alcoholic beverages.  Learning ways to reduce stress. Your health care provider may prescribe medicine if lifestyle changes are not enough to get your blood pressure under control, and if one of the following is true:  You are 18-59 years of age and your systolic blood pressure is above 140.  You are 60 years of age or older, and your systolic blood pressure is above 150.  Your diastolic blood pressure is above 90.  You have diabetes, and your systolic blood pressure is over 140 or your diastolic blood pressure is over 90.  You have kidney disease and your blood pressure is above 140/90.  You have heart disease and your blood pressure is above 140/90. Your personal target blood pressure may vary depending on your medical conditions, your age, and other factors. HOME CARE INSTRUCTIONS    Have your blood pressure rechecked as directed by your health care provider.   Take medicines only as directed by your health care provider. Follow the directions carefully. Blood pressure medicines must be taken as prescribed. The medicine does not work as well when you skip doses. Skipping doses also puts you at risk for  problems.  Do not smoke.   Monitor your blood pressure at home as directed by your health care provider. SEEK MEDICAL CARE IF:   You think you are having a reaction to medicines taken.  You have recurrent headaches or feel dizzy.  You have swelling in your ankles.  You have trouble with your vision. SEEK IMMEDIATE MEDICAL CARE IF:  You develop a severe headache or confusion.  You have unusual weakness, numbness, or feel faint.  You have severe chest or abdominal pain.  You vomit repeatedly.  You have trouble breathing. MAKE SURE YOU:   Understand these instructions.  Will watch your condition.  Will get help right away if you are not doing well or get worse.   This information is not intended to replace advice given to you by your health care provider. Make sure you discuss any questions you have with your health care provider.   Document Released: 07/24/2005 Document Revised: 12/08/2014 Document Reviewed: 05/16/2013 Elsevier Interactive Patient Education 2016 [employer] Sciatica Sciatica is pain, weakness, numbness, or tingling along the path of the sciatic nerve. The nerve starts in the lower back and runs down the back of each leg. The nerve controls the muscles in the lower leg and in the back of the knee, while also providing sensation to the back of the thigh, lower leg, and the sole of your foot. Sciatica is a symptom of another medical condition. For instance, nerve damage or certain conditions, such as a herniated disk or bone spur on the spine, pinch or put pressure on the sciatic nerve. This causes the pain, weakness, or other sensations normally associated with sciatica. Generally, sciatica only affects one side of the body. CAUSES   Herniated or slipped disc.  Degenerative disk disease.  A pain disorder involving the narrow muscle in the buttocks (piriformis syndrome).  Pelvic injury or fracture.  Pregnancy.  Tumor (rare). SYMPTOMS  Symptoms can  vary from mild to very severe. The symptoms usually travel from the low back to the buttocks and down the back of the leg. Symptoms can include:  Mild tingling or dull aches in the lower back, leg, or hip.  Numbness in the back of the calf or sole of the foot.  Burning sensations in the lower back, leg, or hip.  Sharp pains in the lower back, leg, or hip.  Leg weakness.  Severe back pain inhibiting movement. These symptoms may get worse with coughing, sneezing, laughing, or prolonged sitting or standing. Also, being overweight may worsen symptoms. DIAGNOSIS  Your caregiver will perform a physical exam to look for common symptoms of sciatica. He or she may ask you to do certain movements or activities that would trigger sciatic nerve pain. Other tests may be performed to find the cause of the sciatica. These may include:  Blood tests.  X-rays.  Imaging tests, such as an MRI or CT scan. TREATMENT  Treatment is directed at the cause of the sciatic pain. Sometimes, treatment is not necessary and the pain and discomfort goes away on its own. If treatment is needed, your caregiver may suggest:  Over-the-counter medicines to relieve pain.  Prescription medicines, such as  anti-inflammatory medicine, muscle relaxants, or narcotics.  Applying heat or ice to the painful area.  Steroid injections to lessen pain, irritation, and inflammation around the nerve.  Reducing activity during periods of pain.  Exercising and stretching to strengthen your abdomen and improve flexibility of your spine. Your caregiver may suggest losing weight if the extra weight makes the back pain worse.  Physical therapy.  Surgery to eliminate what is pressing or pinching the nerve, such as a bone spur or part of a herniated disk. HOME CARE INSTRUCTIONS   Only take over-the-counter or prescription medicines for pain or discomfort as directed by your caregiver.  Apply ice to the affected area for 20 minutes,  3-4 times a day for the first 48-72 hours. Then try heat in the same way.  Exercise, stretch, or perform your usual activities if these do not aggravate your pain.  Attend physical therapy sessions as directed by your caregiver.  Keep all follow-up appointments as directed by your caregiver.  Do not wear high heels or shoes that do not provide proper support.  Check your mattress to see if it is too soft. A firm mattress may lessen your pain and discomfort. SEEK IMMEDIATE MEDICAL CARE IF:   You lose control of your bowel or bladder (incontinence).  You have increasing weakness in the lower back, pelvis, buttocks, or legs.  You have redness or swelling of your back.  You have a burning sensation when you urinate.  You have pain that gets worse when you lie down or awakens you at night.  Your pain is worse than you have experienced in the past.  Your pain is lasting longer than 4 weeks.  You are suddenly losing weight without reason. MAKE SURE YOU:  Understand these instructions.  Will watch your condition.  Will get help right away if you are not doing well or get worse.   This information is not intended to replace advice given to you by your health care provider. Make sure you discuss any questions you have with your health care provider.   Document Released: 07/18/2001 Document Revised: 04/14/2015 Document Reviewed: 12/03/2011 Elsevier Interactive Patient Education [employer].

## 2015-05-20 NOTE — ED Provider Notes (Signed)
CSN: [id number]     Arrival date & time 05/20/15  8502 History   First MD Initiated Contact with Patient 05/20/15 [phone]     Chief Complaint  Patient presents with  . Hip Pain     (Consider location/radiation/quality/duration/timing/severity/associated sxs/prior Treatment) Patient is a 59 y.o. male presenting with back pain. The history is provided by the patient. No language interpreter was used.  Back Pain Location:  Lumbar spine and gluteal region Quality:  Aching Radiates to:  Does not radiate Pain severity:  Severe Pain is:  Same all the time Onset quality:  Gradual Duration:  1 day Timing:  Constant Progression:  Worsening Chronicity:  New Context: not emotional stress   Relieved by:  Nothing Worsened by:  Nothing tried Ineffective treatments:  None tried Associated symptoms: no abdominal pain, no bladder incontinence, no bowel incontinence, no numbness, no paresthesias and no perianal numbness   Risk factors: not obese   Pt complains of pain in low back that goes down right low back  Past Medical History  Diagnosis Date  . Back pain   . Diabetes mellitus without complication ([place])     pt denies  . Muscle spasm     takes Flexeril daily  . Shortness of breath     with exertion  . History of bronchitis   . Headache(784.0)     related to neck issues  . Weakness     and numbness in left fingers  . Arthritis   . Joint pain   . GERD (gastroesophageal reflux disease)     using Baking Soda and Water per pt  . History of gastric ulcer 20+yrs ago  . Cataracts, bilateral     immature  . Hypertension     takes Benazepril some days   Past Surgical History  Procedure Laterality Date  . Shoulder surgery Right     x 3   . Arm surgery Left   . Joint replacement Right     hip   . Anterior cervical decomp/discectomy fusion N/A 12/18/2013    Procedure: ANTERIOR CERVICAL DECOMPRESSION/DISCECTOMY FUSION CERVICAL THREE-FOUR, FOUR-FIVE, FIVE-SIX W/ BONEGRAFT;  Surgeon:  Hosie Spangle, MD;  Location: [facility];  Service: Neurosurgery;  Laterality: N/A;  ANTERIOR CERVICAL DECOMPRESSION/DISCECTOMY FUSION CERVICAL THREE-FOUR, FOUR-FIVE, FIVE-SIX W/ BONEGRAFT   History reviewed. No pertinent family history. Social History  Substance Use Topics  . Smoking status: Current Every Day Smoker -- 0.50 packs/day for 30 years    Types: Cigarettes  . Smokeless tobacco: None  . Alcohol Use: No     Comment: 6 months    Review of Systems  Gastrointestinal: Negative for abdominal pain and bowel incontinence.  Genitourinary: Negative for bladder incontinence.  Musculoskeletal: Positive for back pain.  Neurological: Negative for numbness and paresthesias.  All other systems reviewed and are negative.     Allergies  Review of patient's allergies indicates no known allergies.  Home Medications   Prior to Admission medications   Medication Sig Start Date End Date Taking? Authorizing Provider  benazepril (LOTENSIN) 10 MG tablet Take 1 tablet (10 mg total) by mouth daily. 11/20/13   Lily Kocher, PA-C  cyclobenzaprine (FLEXERIL) 5 MG tablet Take 7.5 mg by mouth 3 (three) times daily as needed for muscle spasms.    Historical Provider, MD  dexamethasone (DECADRON) 6 MG tablet Take 6 mg by mouth 2 (two) times daily with a meal.    Historical Provider, MD  diclofenac (VOLTAREN) 75 MG EC tablet Take 75  mg by mouth 2 (two) times daily.    Historical Provider, MD  oxyCODONE-acetaminophen (PERCOCET/ROXICET) 5-325 MG per tablet Take 1-2 tablets by mouth every 4 (four) hours as needed for moderate pain. 12/19/13   Jovita Gamma, MD   BP 165/112 mmHg  Pulse 110  Temp(Src) 97.9 F (36.6 C) (Oral)  Resp 18  Ht 5\' 10"  (1.778 m)  Wt 160 lb (72.576 kg)  BMI 22.96 kg/m2  SpO2 95% Physical Exam  Constitutional: He is oriented to person, place, and time. He appears well-developed and well-nourished.  Cardiovascular: Normal rate.   Musculoskeletal: He exhibits tenderness.   diffuss tender low back.  No pain with range of motion of hip,  nv and ns intact   Neurological: He is alert and oriented to person, place, and time.  Skin: Skin is warm.  Psychiatric: He has a normal mood and affect.  Nursing note and vitals reviewed.   ED Course  Procedures (including critical care time) Labs Review Labs Reviewed - No data to display  Imaging Review No results found. I have personally reviewed and evaluated these images and lab results as part of my medical decision-making.   EKG Interpretation None      MDM  i suspect pt has sciatica.  I will treat with prednisone and voltaren.  Pt given rx for former blood pressure medication lotensin   Final diagnoses:  Sciatica, right  Essential hypertension    Pt given dilaudid. torodol and solumedrol.   Pt reports feeling better after injection.  Pt is out of BP medication.  Pt does not have a regular Md.    [place], PA-C 05/20/15 [place], MD 05/21/15 [phone]

## 2015-07-26 ENCOUNTER — Encounter (HOSPITAL_COMMUNITY): Payer: Self-pay | Admitting: Emergency Medicine

## 2015-07-26 ENCOUNTER — Emergency Department (HOSPITAL_COMMUNITY)
Admission: EM | Admit: 2015-07-26 | Discharge: 2015-07-26 | Disposition: A | Discharge status: Home / Self Care | Payer: Medicare Other | Attending: Emergency Medicine | Admitting: Emergency Medicine

## 2015-07-26 DIAGNOSIS — H269 Unspecified cataract: Secondary | ICD-10-CM | POA: Diagnosis not present

## 2015-07-26 DIAGNOSIS — Z8719 Personal history of other diseases of the digestive system: Secondary | ICD-10-CM | POA: Insufficient documentation

## 2015-07-26 DIAGNOSIS — Z8709 Personal history of other diseases of the respiratory system: Secondary | ICD-10-CM | POA: Insufficient documentation

## 2015-07-26 DIAGNOSIS — M25551 Pain in right hip: Secondary | ICD-10-CM | POA: Diagnosis present

## 2015-07-26 DIAGNOSIS — M1990 Unspecified osteoarthritis, unspecified site: Secondary | ICD-10-CM | POA: Insufficient documentation

## 2015-07-26 DIAGNOSIS — M545 Low back pain, unspecified: Secondary | ICD-10-CM

## 2015-07-26 DIAGNOSIS — E119 Type 2 diabetes mellitus without complications: Secondary | ICD-10-CM | POA: Diagnosis not present

## 2015-07-26 DIAGNOSIS — Z79899 Other long term (current) drug therapy: Secondary | ICD-10-CM | POA: Insufficient documentation

## 2015-07-26 DIAGNOSIS — F17210 Nicotine dependence, cigarettes, uncomplicated: Secondary | ICD-10-CM | POA: Diagnosis not present

## 2015-07-26 DIAGNOSIS — I10 Essential (primary) hypertension: Secondary | ICD-10-CM | POA: Diagnosis not present

## 2015-07-26 MED ORDER — NAPROXEN 500 MG PO TABS: 500.0000 mg | ORAL_TABLET | Freq: Two times a day (BID) | ORAL | Status: DC

## 2015-07-26 MED ORDER — OXYCODONE-ACETAMINOPHEN 5-325 MG PO TABS
1.0000 | ORAL_TABLET | Freq: Once | ORAL | Status: AC
  Administered 2015-07-26: 1 via ORAL
  Filled 2015-07-26: qty 1

## 2015-07-26 MED ORDER — HYDROCODONE-ACETAMINOPHEN 5-325 MG PO TABS: 1.0000 | ORAL_TABLET | Freq: Four times a day (QID) | ORAL | Status: DC | PRN

## 2015-07-26 NOTE — Discharge Instructions (Signed)
Take your bp medicine and follow up with [facility] 

## 2015-07-26 NOTE — ED Provider Notes (Signed)
CSN: [id number]     Arrival date & time 07/26/15  1210 History   First MD Initiated Contact with Patient 07/26/15 1337     Chief Complaint  Patient presents with  . Hip Pain     (Consider location/radiation/quality/duration/timing/severity/associated sxs/prior Treatment) Patient is a 59 y.o. male presenting with hip pain. The history is provided by the patient (The patient complains of lower back pain and right hip pain worse with seated.  This is been going on for a number of months patient had x-rays here that showed degenerative changes in his lumbar spine).  Hip Pain This is a recurrent problem. The current episode started more than 2 days ago. The problem occurs constantly. Pertinent negatives include no chest pain, no abdominal pain and no headaches. Exacerbated by: Sitting. Nothing relieves the symptoms.    Past Medical History  Diagnosis Date  . Back pain   . Diabetes mellitus without complication ([place])     pt denies  . Muscle spasm     takes Flexeril daily  . Shortness of breath     with exertion  . History of bronchitis   . Headache(784.0)     related to neck issues  . Weakness     and numbness in left fingers  . Arthritis   . Joint pain   . GERD (gastroesophageal reflux disease)     using Baking Soda and Water per pt  . History of gastric ulcer 20+yrs ago  . Cataracts, bilateral     immature  . Hypertension     takes Benazepril some days   Past Surgical History  Procedure Laterality Date  . Shoulder surgery Right     x 3   . Arm surgery Left   . Joint replacement Right     hip   . Anterior cervical decomp/discectomy fusion N/A 12/18/2013    Procedure: ANTERIOR CERVICAL DECOMPRESSION/DISCECTOMY FUSION CERVICAL THREE-FOUR, FOUR-FIVE, FIVE-SIX W/ BONEGRAFT;  Surgeon: Hosie Spangle, MD;  Location: [facility];  Service: Neurosurgery;  Laterality: N/A;  ANTERIOR CERVICAL DECOMPRESSION/DISCECTOMY FUSION CERVICAL THREE-FOUR, FOUR-FIVE, FIVE-SIX W/ BONEGRAFT    History reviewed. No pertinent family history. Social History  Substance Use Topics  . Smoking status: Current Every Day Smoker -- 0.50 packs/day for 30 years    Types: Cigarettes  . Smokeless tobacco: None  . Alcohol Use: No     Comment: 6 months    Review of Systems  Constitutional: Negative for appetite change and fatigue.  HENT: Negative for congestion, ear discharge and sinus pressure.   Eyes: Negative for discharge.  Respiratory: Negative for cough.   Cardiovascular: Negative for chest pain.  Gastrointestinal: Negative for abdominal pain and diarrhea.  Genitourinary: Negative for frequency and hematuria.  Musculoskeletal: Positive for back pain.       Right hip pain  Skin: Negative for rash.  Neurological: Negative for seizures and headaches.  Psychiatric/Behavioral: Negative for hallucinations.      Allergies  Review of patient's allergies indicates no known allergies.  Home Medications   Prior to Admission medications   Medication Sig Start Date End Date Taking? Authorizing Provider  benazepril (LOTENSIN) 10 MG tablet Take 1 tablet (10 mg total) by mouth daily. 05/20/15  Yes Fransico Meadow, PA-C  HYDROcodone-acetaminophen (NORCO/VICODIN) 5-325 MG tablet Take 1 tablet by mouth every 6 (six) hours as needed. 07/26/15   Milton Ferguson, MD  naproxen (NAPROSYN) 500 MG tablet Take 1 tablet (500 mg total) by mouth 2 (two) times daily. 07/26/15  Milton Ferguson, MD   BP 168/116 mmHg  Pulse 82  Temp(Src) 97.6 F (36.4 C) (Oral)  Resp 16  Ht 5\' 10"  (1.778 m)  Wt 165 lb (74.844 kg)  BMI 23.68 kg/m2  SpO2 97% Physical Exam  Constitutional: He is oriented to person, place, and time. He appears well-developed.  HENT:  Head: Normocephalic.  Eyes: Conjunctivae are normal.  Neck: No tracheal deviation present.  Cardiovascular:  No murmur heard. Musculoskeletal: Normal range of motion.  Mild tenderness to right hip and lower lumbar spine.  Negative straight leg raise.   Neurological: He is oriented to person, place, and time.  Skin: Skin is warm.  Psychiatric: He has a normal mood and affect.    ED Course  Procedures (including critical care time) Labs Review Labs Reviewed - No data to display  Imaging Review No results found. I have personally reviewed and evaluated these images and lab results as part of my medical decision-making.   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

## 2015-07-26 NOTE — ED Notes (Signed)
PT c/o chronic right hip pain worsening x3 days with no reported injury. PT ambulatory in triage.

## 2016-01-04 ENCOUNTER — Emergency Department (HOSPITAL_COMMUNITY)
Admission: EM | Admit: 2016-01-04 | Discharge: 2016-01-04 | Disposition: A | Discharge status: Home / Self Care | Payer: Medicare Other | Attending: Emergency Medicine | Admitting: Emergency Medicine

## 2016-01-04 ENCOUNTER — Emergency Department (HOSPITAL_COMMUNITY): Payer: Medicare Other

## 2016-01-04 ENCOUNTER — Encounter (HOSPITAL_COMMUNITY): Payer: Self-pay

## 2016-01-04 DIAGNOSIS — I10 Essential (primary) hypertension: Secondary | ICD-10-CM | POA: Insufficient documentation

## 2016-01-04 DIAGNOSIS — H539 Unspecified visual disturbance: Secondary | ICD-10-CM | POA: Diagnosis not present

## 2016-01-04 DIAGNOSIS — M1990 Unspecified osteoarthritis, unspecified site: Secondary | ICD-10-CM | POA: Diagnosis not present

## 2016-01-04 DIAGNOSIS — E119 Type 2 diabetes mellitus without complications: Secondary | ICD-10-CM | POA: Diagnosis not present

## 2016-01-04 DIAGNOSIS — R519 Headache, unspecified: Secondary | ICD-10-CM

## 2016-01-04 DIAGNOSIS — F17210 Nicotine dependence, cigarettes, uncomplicated: Secondary | ICD-10-CM | POA: Diagnosis not present

## 2016-01-04 DIAGNOSIS — Z79899 Other long term (current) drug therapy: Secondary | ICD-10-CM | POA: Insufficient documentation

## 2016-01-04 DIAGNOSIS — R51 Headache: Secondary | ICD-10-CM | POA: Diagnosis not present

## 2016-01-04 DIAGNOSIS — R55 Syncope and collapse: Secondary | ICD-10-CM | POA: Insufficient documentation

## 2016-01-04 LAB — CBC WITH DIFFERENTIAL/PLATELET
Basophils Absolute: 0 10*3/uL (ref 0.0–0.1)
Basophils Relative: 0 %
Eosinophils Absolute: 0.1 10*3/uL (ref 0.0–0.7)
Eosinophils Relative: 1 %
HCT: 44.9 % (ref 39.0–52.0)
Hemoglobin: 15.3 g/dL (ref 13.0–17.0)
LYMPHS PCT: 26 %
Lymphs Abs: 2.1 10*3/uL (ref 0.7–4.0)
MCH: 31 pg (ref 26.0–34.0)
MCHC: 34.1 g/dL (ref 30.0–36.0)
MCV: 91.1 fL (ref 78.0–100.0)
Monocytes Absolute: 0.7 10*3/uL (ref 0.1–1.0)
Monocytes Relative: 9 %
NEUTROS ABS: 5.2 10*3/uL (ref 1.7–7.7)
NEUTROS PCT: 64 %
Platelets: 201 10*3/uL (ref 150–400)
RBC: 4.93 MIL/uL (ref 4.22–5.81)
RDW: 12.4 % (ref 11.5–15.5)
WBC: 8.1 10*3/uL (ref 4.0–10.5)

## 2016-01-04 LAB — BASIC METABOLIC PANEL
ANION GAP: 10 (ref 5–15)
BUN: 12 mg/dL (ref 6–20)
CO2: 26 mmol/L (ref 22–32)
Calcium: 9.3 mg/dL (ref 8.9–10.3)
Chloride: 102 mmol/L (ref 101–111)
Creatinine, Ser: 1.2 mg/dL (ref 0.61–1.24)
GFR calc Af Amer: >60 mL/min (ref >60)
GLUCOSE: 85 mg/dL (ref 65–99)
POTASSIUM: 3.8 mmol/L (ref 3.5–5.1)
Sodium: 138 mmol/L (ref 135–145)

## 2016-01-04 LAB — TROPONIN I: Troponin I: <0.03 ng/mL (ref <0.031)

## 2016-01-04 MED ORDER — IOPAMIDOL (ISOVUE-370) INJECTION 76%
75.0000 mL | Freq: Once | INTRAVENOUS | Status: AC | PRN
  Administered 2016-01-04: 75 mL via INTRAVENOUS

## 2016-01-04 MED ORDER — GADOBENATE DIMEGLUMINE 529 MG/ML IV SOLN
15.0000 mL | Freq: Once | INTRAVENOUS | Status: AC | PRN
  Administered 2016-01-04: 15 mL via INTRAVENOUS

## 2016-01-04 MED ORDER — BENAZEPRIL HCL 5 MG PO TABS
ORAL_TABLET | ORAL | Status: AC
  Filled 2016-01-04: qty 2

## 2016-01-04 MED ORDER — BENAZEPRIL HCL 10 MG PO TABS
10.0000 mg | ORAL_TABLET | Freq: Every day | ORAL | Status: DC
  Administered 2016-01-04: 10 mg via ORAL

## 2016-01-04 MED ORDER — HYDROCODONE-ACETAMINOPHEN 5-325 MG PO TABS
1.0000 | ORAL_TABLET | Freq: Once | ORAL | Status: AC
  Administered 2016-01-04: 1 via ORAL
  Filled 2016-01-04: qty 1

## 2016-01-04 MED ORDER — BENAZEPRIL HCL 10 MG PO TABS: 10.0000 mg | ORAL_TABLET | Freq: Every day | ORAL | Status: DC

## 2016-01-04 NOTE — ED Notes (Signed)
Instructed pt to take HTN med everyday and try to take at same time everyday as well. Exercise and low salt diet will be helpful in controlling BP

## 2016-01-04 NOTE — Discharge Instructions (Signed)
Your CT head and neck and your MRI did not show serious cause of your symptoms. Please take your blood pressure medications as prescribed, and follow-up closely with your primary care doctor.  Return for worsening symptoms, including fever, confusion, vomiting and unable to keep down food/fluids, new numbness/weakness, new vision or speech changes, inability to walk, or any other symptoms concerning to you.   Near-Syncope Near-syncope (commonly known as near fainting) is sudden weakness, dizziness, or feeling like you might pass out. During an episode of near-syncope, you may also develop pale skin, have tunnel vision, or feel sick to your stomach (nauseous). Near-syncope may occur when getting up after sitting or while standing for a long time. It is caused by a sudden decrease in blood flow to the brain. This decrease can result from various causes or triggers, most of which are not serious. However, because near-syncope can sometimes be a sign of something serious, a medical evaluation is required. The specific cause is often not determined. HOME CARE INSTRUCTIONS  Monitor your condition for any changes. The following actions may help to alleviate any discomfort you are experiencing:  Have someone stay with you until you feel stable.  Lie down right away and prop your feet up if you start feeling like you might faint. Breathe deeply and steadily. Wait until all the symptoms have passed. Most of these episodes last only a few minutes. You may feel tired for several hours.   Drink enough fluids to keep your urine clear or pale yellow.   If you are taking blood pressure or heart medicine, get up slowly when seated or lying down. Take several minutes to sit and then stand. This can reduce dizziness.  Follow up with your health care provider as directed. SEEK IMMEDIATE MEDICAL CARE IF:   You have a severe headache.   You have unusual pain in the chest, abdomen, or back.   You are bleeding  from the mouth or rectum, or you have black or tarry stool.   You have an irregular or very fast heartbeat.   You have repeated fainting or have seizure-like jerking during an episode.   You faint when sitting or lying down.   You have confusion.   You have difficulty walking.   You have severe weakness.   You have vision problems.  MAKE SURE YOU:   Understand these instructions.  Will watch your condition.  Will get help right away if you are not doing well or get worse.   This information is not intended to replace advice given to you by your health care provider. Make sure you discuss any questions you have with your health care provider.   Document Released: 07/24/2005 Document Revised: 07/29/2013 Document Reviewed: 12/27/2012 Elsevier Interactive Patient Education 2016 [place] Headache Without Cause A headache is pain or discomfort felt around the head or neck area. The specific cause of a headache may not be found. There are many causes and types of headaches. A few common ones are:  Tension headaches.  Migraine headaches.  Cluster headaches.  Chronic daily headaches. HOME CARE INSTRUCTIONS  Watch your condition for any changes. Take these steps to help with your condition: Managing Pain  Take over-the-counter and prescription medicines only as told by your health care provider.  Lie down in a dark, quiet room when you have a headache.  If directed, apply ice to the head and neck area:  Put ice in a plastic bag.  Place a towel  between your skin and the bag.  Leave the ice on for 20 minutes, 2-3 times per day.  Use a heating pad or hot shower to apply heat to the head and neck area as told by your health care provider.  Keep lights dim if bright lights bother you or make your headaches worse. Eating and Drinking  Eat meals on a regular schedule.  Limit alcohol use.  Decrease the amount of caffeine you drink, or stop drinking  caffeine. General Instructions  Keep all follow-up visits as told by your health care provider. This is important.  Keep a headache journal to help find out what may trigger your headaches. For example, write down:  What you eat and drink.  How much sleep you get.  Any change to your diet or medicines.  Try massage or other relaxation techniques.  Limit stress.  Sit up straight, and do not tense your muscles.  Do not use tobacco products, including cigarettes, chewing tobacco, or e-cigarettes. If you need help quitting, ask your health care provider.  Exercise regularly as told by your health care provider.  Sleep on a regular schedule. Get 7-9 hours of sleep, or the amount recommended by your health care provider. SEEK MEDICAL CARE IF:   Your symptoms are not helped by medicine.  You have a headache that is different from the usual headache.  You have nausea or you vomit.  You have a fever. SEEK IMMEDIATE MEDICAL CARE IF:   Your headache becomes severe.  You have repeated vomiting.  You have a stiff neck.  You have a loss of vision.  You have problems with speech.  You have pain in the eye or ear.  You have muscular weakness or loss of muscle control.  You lose your balance or have trouble walking.  You feel faint or pass out.  You have confusion.   This information is not intended to replace advice given to you by your health care provider. Make sure you discuss any questions you have with your health care provider.   Document Released: 07/24/2005 Document Revised: 04/14/2015 Document Reviewed: 11/16/2014 Elsevier Interactive Patient Education [employer].

## 2016-01-04 NOTE — ED Notes (Addendum)
Pt reports was at home alone and reached to get a tissue to blow his nose and had sudden onset of visual field change, dizziness, and diaphoresis.  Pt says the episode lasted approx 3 min.  Pt says at this time he feels normal.  Pt says right before the episode happened, he had a severe pain behind left ear and in forehead.  Pt presently denies any pain.   EMS put in 18g IV and ekg. Reports cbg was 104 per ems.  Pt also says he had some chest pain 9 days ago.  EMS says today pt's initial bp was [id number] systolic.

## 2016-01-04 NOTE — ED Notes (Signed)
Pt had another brief episode of severe pain in left temple for a few seconds then it went away.  No visual changes at this time, no pain at this time.

## 2016-01-04 NOTE — ED Provider Notes (Addendum)
CSN: [id number]     Arrival date & time 01/04/16  1334 History   First MD Initiated Contact with Patient 01/04/16 1350     Chief Complaint  Patient presents with  . Visual Field Change  . Near Syncope     (Consider location/radiation/quality/duration/timing/severity/associated sxs/prior Treatment) HPI   60 year old male with history of hypertension who presents with headache, vision changes, and near syncope. States that he has had long-standing noncompliance with his blood pressure medications. Has also had a history of intermittent headaches in the past. States that he felt like he had to blow his nose today, and went right as he was about to blow his nose he had a sudden onset of the severe sharp headache in the back of his head behind the left ear and the left side of his neck that shot up into his head. States that the pain was so severe that he got very lightheaded, saw his vision closing in, and became very diaphoretic. States that his vision was blurry, and felt like he was seeing into a tunnel. States that headache lasted for seconds but blurry vision did not fully resolve until about 3 minutes. No associating syncope, chest pain or sob, abd pain or back pain, focal numbness/weakness. States he has rotator cuff issues of right shoulder and has been having longstanding right shoulder pain for several weeks now that has been nonremitting.   Past Medical History  Diagnosis Date  . Back pain   . Muscle spasm     takes Flexeril daily  . Shortness of breath     with exertion  . History of bronchitis   . Headache(784.0)     related to neck issues  . Weakness     and numbness in left fingers  . Arthritis   . Joint pain   . GERD (gastroesophageal reflux disease)     using Baking Soda and Water per pt  . History of gastric ulcer 20+yrs ago  . Cataracts, bilateral     immature  . Hypertension     takes Benazepril some days  . Diabetes mellitus without complication ([place])     pt  denies   Past Surgical History  Procedure Laterality Date  . Shoulder surgery Right     x 3   . Arm surgery Left   . Joint replacement Right     hip   . Anterior cervical decomp/discectomy fusion N/A 12/18/2013    Procedure: ANTERIOR CERVICAL DECOMPRESSION/DISCECTOMY FUSION CERVICAL THREE-FOUR, FOUR-FIVE, FIVE-SIX W/ BONEGRAFT;  Surgeon: Hosie Spangle, MD;  Location: [facility];  Service: Neurosurgery;  Laterality: N/A;  ANTERIOR CERVICAL DECOMPRESSION/DISCECTOMY FUSION CERVICAL THREE-FOUR, FOUR-FIVE, FIVE-SIX W/ BONEGRAFT   No family history on file. Social History  Substance Use Topics  . Smoking status: Current Every Day Smoker -- 0.50 packs/day for 30 years    Types: Cigarettes  . Smokeless tobacco: None  . Alcohol Use: No    Review of Systems 10/14 systems reviewed and are negative other than those stated in the HPI    Allergies  Review of patient's allergies indicates no known allergies.  Home Medications   Prior to Admission medications   Medication Sig Start Date End Date Taking? Authorizing Provider  esomeprazole (NEXIUM) 40 MG capsule Take 40 mg by mouth daily at 12 noon.   Yes Historical Provider, MD  benazepril (LOTENSIN) 10 MG tablet Take 1 tablet (10 mg total) by mouth daily. Patient not taking: Reported on 01/04/2016 05/20/15   Magda Paganini  K Sofia, PA-C  benazepril (LOTENSIN) 10 MG tablet Take 1 tablet (10 mg total) by mouth daily. 01/04/16   Forde Dandy, MD  HYDROcodone-acetaminophen (NORCO/VICODIN) 5-325 MG tablet Take 1 tablet by mouth every 6 (six) hours as needed. Patient not taking: Reported on 01/04/2016 07/26/15   Milton Ferguson, MD  naproxen (NAPROSYN) 500 MG tablet Take 1 tablet (500 mg total) by mouth 2 (two) times daily. Patient not taking: Reported on 01/04/2016 07/26/15   Milton Ferguson, MD   BP 177/109 mmHg  Pulse 53  Temp(Src) 97.8 F (36.6 C) (Oral)  Resp 18  Ht 5\' 11"  (1.803 m)  Wt 170 lb (77.111 kg)  BMI 23.72 kg/m2  SpO2 100% Physical  Exam Physical Exam  Nursing note and vitals reviewed. Constitutional: Well developed, well nourished, non-toxic, and in no acute distress Head: Normocephalic and atraumatic.  Mouth/Throat: Oropharynx is clear and moist.  Neck: Normal range of motion. Neck supple.  Cardiovascular: Normal rate and regular rhythm.   Pulmonary/Chest: Effort normal and breath sounds normal.  Abdominal: Soft. There is no tenderness. There is no rebound and no guarding.  Musculoskeletal: Normal range of motion.  Skin: Skin is warm and dry.  Psychiatric: Cooperative Neurological:  Alert, oriented to person, place, time, and situation. Memory grossly in tact. Fluent speech. No dysarthria or aphasia.  Cranial nerves: VF are full.  Pupils are symmetric, and reactive to light. EOMI without nystagmus. No gaze deviation. Facial muscles symmetric with activation. Sensation to light touch over face in tact bilaterally. Hearing grossly in tact. Palate elevates symmetrically. Head turn and shoulder shrug are intact. Tongue midline.  Reflexes defered.  Muscle bulk and tone normal. No pronator drift. Moves all extremities symmetrically. Sensation to light touch is in tact throughout in bilateral upper and lower extremities. Coordination reveals no dysmetria with finger to nose. Gait is narrow-based and steady. Non-ataxic.   ED Course  Procedures (including critical care time) Labs Review Labs Reviewed  CBC WITH DIFFERENTIAL/PLATELET  BASIC METABOLIC PANEL  TROPONIN I  TROPONIN I    Imaging Review Ct Angio Head W/cm &/or Wo Cm  01/04/2016  CLINICAL DATA:  Blurred vision occurring earlier today. Normal neurologic exam now is reported. EXAM: CT ANGIOGRAPHY HEAD AND NECK TECHNIQUE: Multidetector CT imaging of the head and neck was performed using the standard protocol during bolus administration of intravenous contrast. Multiplanar CT image reconstructions and MIPs were obtained to evaluate the vascular anatomy. Carotid  stenosis measurements (when applicable) are obtained utilizing NASCET criteria, using the distal internal carotid diameter as the denominator. CONTRAST:  Isovue 370, 75 mL. COMPARISON:  None. FINDINGS: CT HEAD Calvarium and skull base: No fracture or destructive lesion. Mastoids and middle ears are grossly clear. Paranasal sinuses: Imaged portions are clear. Orbits: Negative. Brain: No evidence of acute abnormality, including acute infarct, hemorrhage, hydrocephalus, or mass lesion. CTA NECK Aortic arch: Standard branching. Imaged portion shows no evidence of aneurysm or dissection. No significant stenosis of the major arch vessel origins. Atheromatous change of the transverse arch and proximal great vessels non flow reducing. Right carotid system: Calcified plaque at the RIGHT carotid bifurcation. Less than 50% stenosis based on luminal measurements of 3.0/4.2 proximal/distal. No evidence of dissection, or occlusion. Left carotid system: Calcified plaque at the bifurcation. No measurable luminal narrowing. No evidence of dissection, stenosis (50% or greater) or occlusion. Vertebral arteries: Codominant. No evidence of dissection, stenosis (50% or greater) or occlusion. Nonvascular soft tissues: Previous cervical fusion C3 through C6 appears unremarkable. The  no neck masses. No lung apex lesion. No acute osseous findings. CTA HEAD Anterior circulation: Non stenotic calcification of the cavernous carotids bilaterally. ICA termini widely patent. No M1 or A1 stenosis of significance. No branch occlusion. No aneurysm, or vascular malformation. 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 Hypoplastic vertebral and basilar system which supply the cerebellar vessels only. No significant stenosis, proximal occlusion, aneurysm, or vascular malformation. Venous sinuses: As permitted by contrast timing, patent. Anatomic variants: Persistent trigeminal supplies the posterior cerebral arteries.  Also observed is hypertrophied posterior communicating arteries. Delayed phase:   No abnormal intracranial enhancement. IMPRESSION: No extracranial or intracranial flow reducing lesion is seen. Persistent trigeminal artery, normal variant, but no specific abnormality which could contribute to visual loss. No visible acute intracranial findings. Unremarkable appearing C3 through C6 fusion Electronically Signed   By: Staci Righter M.D.   On: 01/04/2016 16:17   Ct Angio Neck W/cm &/or Wo/cm  01/04/2016  CLINICAL DATA:  Blurred vision occurring earlier today. Normal neurologic exam now is reported. EXAM: CT ANGIOGRAPHY HEAD AND NECK TECHNIQUE: Multidetector CT imaging of the head and neck was performed using the standard protocol during bolus administration of intravenous contrast. Multiplanar CT image reconstructions and MIPs were obtained to evaluate the vascular anatomy. Carotid stenosis measurements (when applicable) are obtained utilizing NASCET criteria, using the distal internal carotid diameter as the denominator. CONTRAST:  Isovue 370, 75 mL. COMPARISON:  None. FINDINGS: CT HEAD Calvarium and skull base: No fracture or destructive lesion. Mastoids and middle ears are grossly clear. Paranasal sinuses: Imaged portions are clear. Orbits: Negative. Brain: No evidence of acute abnormality, including acute infarct, hemorrhage, hydrocephalus, or mass lesion. CTA NECK Aortic arch: Standard branching. Imaged portion shows no evidence of aneurysm or dissection. No significant stenosis of the major arch vessel origins. Atheromatous change of the transverse arch and proximal great vessels non flow reducing. Right carotid system: Calcified plaque at the RIGHT carotid bifurcation. Less than 50% stenosis based on luminal measurements of 3.0/4.2 proximal/distal. No evidence of dissection, or occlusion. Left carotid system: Calcified plaque at the bifurcation. No measurable luminal narrowing. No evidence of dissection,  stenosis (50% or greater) or occlusion. Vertebral arteries: Codominant. No evidence of dissection, stenosis (50% or greater) or occlusion. Nonvascular soft tissues: Previous cervical fusion C3 through C6 appears unremarkable. The no neck masses. No lung apex lesion. No acute osseous findings. CTA HEAD Anterior circulation: Non stenotic calcification of the cavernous carotids bilaterally. ICA termini widely patent. No M1 or A1 stenosis of significance. No branch occlusion. No aneurysm, or vascular malformation. 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 Hypoplastic vertebral and basilar system which supply the cerebellar vessels only. No significant stenosis, proximal occlusion, aneurysm, or vascular malformation. Venous sinuses: As permitted by contrast timing, patent. Anatomic variants: Persistent trigeminal supplies the posterior cerebral arteries. Also observed is hypertrophied posterior communicating arteries. Delayed phase:   No abnormal intracranial enhancement. IMPRESSION: No extracranial or intracranial flow reducing lesion is seen. Persistent trigeminal artery, normal variant, but no specific abnormality which could contribute to visual loss. No visible acute intracranial findings. Unremarkable appearing C3 through C6 fusion Electronically Signed   By: Staci Righter M.D.   On: 01/04/2016 16:17   Mr Jeri Cos [id number] Contrast  01/04/2016  CLINICAL DATA:  59 year old male with acute left temporal headache, blurred vision. No known injury. Initial encounter. EXAM: MRI HEAD WITHOUT AND WITH CONTRAST TECHNIQUE: Multiplanar, multiecho pulse sequences of the brain and surrounding structures  were obtained without and with intravenous contrast. CONTRAST:  3mL MULTIHANCE GADOBENATE DIMEGLUMINE 529 MG/ML IV SOLN COMPARISON:  CTA head and neck 1525 hours today. Cervical spine MRI 11/29/2013. FINDINGS: Cerebral volume is within normal limits. No restricted diffusion to suggest acute  infarction. No midline shift, mass effect, evidence of mass lesion, ventriculomegaly, extra-axial collection or acute intracranial hemorrhage. Cervicomedullary junction and pituitary are within normal limits. Major intracranial vascular flow voids are preserved. Fetal type PCA origins again noted. Pearline Cables and white matter signal is within normal limits for age throughout the brain. No cortical encephalomalacia or chronic cerebral blood products identified. No abnormal enhancement identified. Prominent venous structures in the interpeduncular cistern incidentally noted (normal variant). No dural thickening identified. Visible internal auditory structures appear normal. Mastoids are clear. Trace paranasal sinus mucosal thickening. Orbit and scalp soft tissues are negative. Normal bone marrow signal. Stable visualized upper cervical spine. IMPRESSION: No acute intracranial abnormality. Normal for age MRI appearance of the brain. Electronically Signed   By: Genevie Ann M.D.   On: 01/04/2016 19:10   I have personally reviewed and evaluated these images and lab results as part of my medical decision-making.   EKG Interpretation   Date/Time:  Tuesday Jan 04 2016 17:15:12 EDT Ventricular Rate:  55 PR Interval:  156 QRS Duration: 107 QT Interval:  440 QTC Calculation: 421 R Axis:   39 Text Interpretation:  Sinus rhythm Abnormal inferior Q waves No  significant change since last tracing Confirmed by Bethel Sirois MD, Hayat War[place]ton [phone]) on  01/04/2016 7:17:55 PM      MDM   Final diagnoses:  Headache  Near syncope  Vision changes    Presenting with sudden onset headache, brief in nature. On Presentation, he is hypertensive but states that he is noncompliant with his blood pressure medications. He is neurologically intact including vision. No evidence of pain along the temporal arteries, and symptoms not suggestive of that of temporal arteritis. Currently without headache, but he does have brief episodes of sharp lightening  headaches over temple and forehead that travels in nature. He states that he has had headaches like this in the past, consistent with his past headaches although has not had near syncope vs visual field loss with headaches before. CT head and CTA head/neck performed, within 2 hours of symptom onset. No evidence of hemorrhage, mass, or other acute intracranial process. CTA without evidence of vascular dissection. Felt ruled out for [facility]. No infectious symptoms to suggest intracranial infection and without meningismus. Patient concerned that he was having VF cut rather than near syncope. MRI performed subsequently and negative for acute processes such as CVA.  Symptoms resolved while in ED. With chest pain over 1 week ago but nothing since then and no pain now in chest or back. Seems unlikely to be ACS, but serial troponins and serial ekg without ischemia. No concern for dissection or pe. At this time, he is felt stable for discharge home. He will continue supportive care for home.  Strict return and follow-up instructions are reviewed. He expressed understanding of all discharge instructions, and felt comfortable with plan of care.    Forde Dandy, MD 01/04/16 [id number]  Forde Dandy, MD 01/04/16 [phone]

## 2016-08-15 NOTE — Patient Instructions (Signed)
Stephen Sullivan  08/15/2016     @PREFPERIOPPHARMACY @   Your procedure is scheduled on  08/18/2016   Report to [facility] at  71  A.M.  Call this number if you have problems the morning of surgery:  [phone]   Remember:  Do not eat food or drink liquids after midnight.  Take these medicines the morning of surgery with A SIP OF WATER  Lotensin, nexium, hydrocodone.   Do not wear jewelry, make-up or nail polish.  Do not wear lotions, powders, or perfumes, or deoderant.  Do not shave 48 hours prior to surgery.  Men may shave face and neck.  Do not bring valuables to the hospital.  [facility] is not responsible for any belongings or valuables.  Contacts, dentures or bridgework may not be worn into surgery.  Leave your suitcase in the car.  After surgery it may be brought to your room.  For patients admitted to the hospital, discharge time will be determined by your treatment team.  Patients discharged the day of surgery will not be allowed to drive home.   Name and phone number of your driver:   family Special instructions:  none  Please read over the following fact sheets that you were given. Anesthesia Post-op Instructions and Care and Recovery After Surgery       Open Hernia Repair, Adult Open hernia repair is a surgical procedure to fix a hernia. A hernia occurs when an internal organ or tissue pushes out through a weak spot in the abdominal wall muscles. Hernias commonly occur in the groin and around the navel. Most hernias tend to get worse over time. Often, surgery is done to prevent the hernia from becoming bigger, uncomfortable, or an emergency. Emergency surgery may be needed if abdominal contents get stuck in the opening (incarcerated hernia) or the blood supply gets cut off (strangulated hernia). In an open repair, an incision is made in the abdomen to perform the surgery. Tell a health care provider about:  Any allergies you have.  All medicines you  are taking, including vitamins, herbs, eye drops, creams, and over-the-counter medicines.  Any problems you or family members have had with anesthetic medicines.  Any blood or bone disorders you have.  Any surgeries you have had.  Any medical conditions you have, including any recent cold or flu symptoms.  Whether you are pregnant or may be pregnant. What are the risks? Generally, this is a safe procedure. However, problems may occur, including:  Long-lasting (chronic) pain.  Bleeding.  Infection.  Damage to the testicle. This can cause shrinking or swelling.  Damage to the bladder, blood vessels, intestine, or nerves near the hernia.  Trouble passing urine.  Allergic reactions to medicines.  Return of the hernia. What happens before the procedure? Staying hydrated  Follow instructions from your health care provider about hydration, which may include:  Up to 2 hours before the procedure - you may continue to drink clear liquids, such as water, clear fruit juice, black coffee, and plain tea. Eating and drinking restrictions  Follow instructions from your health care provider about eating and drinking, which may include:  8 hours before the procedure - stop eating heavy meals or foods such as meat, fried foods, or fatty foods.  6 hours before the procedure - stop eating light meals or foods, such as toast or cereal.  6 hours before the procedure - stop drinking milk or drinks that contain milk.  2 hours before the procedure - stop drinking clear liquids. Medicines  Ask your health care provider about:  Changing or stopping your regular medicines. This is especially important if you are taking diabetes medicines or blood thinners.  Taking medicines such as aspirin and ibuprofen. These medicines can thin your blood. Do not take these medicines before your procedure if your health care provider instructs you not to.  You may be given antibiotic medicine to help prevent  infection. General instructions  You may have blood tests or imaging studies.  Ask your health care provider how your surgical site will be marked or identified.  If you smoke, do not smoke for at least 2 weeks before your procedure or for as long as told by your health care provider.  Let your health care provider know if you develop a cold or any infection before your surgery.  Plan to have someone take you home from the hospital or clinic.  If you will be going home right after the procedure, plan to have someone with you for 24 hours. What happens during the procedure?  To reduce your risk of infection:  Your health care team will wash or sanitize their hands.  Your skin will be washed with soap.  Hair may be removed from the surgical area.  An IV tube will be inserted into one of your veins.  You will be given one or more of the following:  A medicine to help you relax (sedative).  A medicine to numb the area (local anesthetic).  A medicine to make you fall asleep (general anesthetic).  Your surgeon will make an incision over the hernia.  The tissues of the hernia will be moved back into place.  The edges of the hernia may be stitched together.  The opening in the abdominal muscles will be closed with stitches (sutures). Or, your surgeon will place a mesh patch made of manmade (synthetic) material over the opening.  The incision will be closed.  A bandage (dressing) may be placed over the incision. The procedure may vary among health care providers and hospitals. What happens after the procedure?  Your blood pressure, heart rate, breathing rate, and blood oxygen level will be monitored until the medicines you were given have worn off.  You may be given medicine for pain.  Do not drive for 24 hours if you received a sedative. This information is not intended to replace advice given to you by your health care provider. Make sure you discuss any questions you  have with your health care provider. Document Released: 01/17/2001 Document Revised: 02/11/2016 Document Reviewed: 01/05/2016 Elsevier Interactive Patient Education  2017 [place] Repair, Adult, Care After These instructions give you information about caring for yourself after your procedure. Your doctor may also give you more specific instructions. If you have problems or questions, contact your doctor. Follow these instructions at home: Surgical cut (incision) care   Follow instructions from your doctor about how to take care of your surgical cut area. Make sure you:  Wash your hands with soap and water before you change your bandage (dressing). If you cannot use soap and water, use hand sanitizer.  Change your bandage as told by your doctor.  Leave stitches (sutures), skin glue, or skin tape (adhesive) strips in place. They may need to stay in place for 2 weeks or longer. If tape strips get loose and curl up, you may trim the loose edges. Do not remove tape strips  completely unless your doctor says it is okay.  Check your surgical cut every day for signs of infection. Check for:  More redness, swelling, or pain.  More fluid or blood.  Warmth.  Pus or a bad smell. Activity  Do not drive or use heavy machinery while taking prescription pain medicine. Do not drive until your doctor says it is okay.  Until your doctor says it is okay:  Do not lift anything that is heavier than 10 lb (4.5 kg).  Do not play contact sports.  Return to your normal activities as told by your doctor. Ask your doctor what activities are safe. General instructions  To prevent or treat having a hard time pooping (constipation) while you are taking prescription pain medicine, your doctor may recommend that you:  Drink enough fluid to keep your pee (urine) clear or pale yellow.  Take over-the-counter or prescription medicines.  Eat foods that are high in fiber, such as fresh fruits  and vegetables, whole grains, and beans.  Limit foods that are high in fat and processed sugars, such as fried and sweet foods.  Take over-the-counter and prescription medicines only as told by your doctor.  Do not take baths, swim, or use a hot tub until your doctor says it is okay.  Keep all follow-up visits as told by your doctor. This is important. Contact a doctor if:  You develop a rash.  You have more redness, swelling, or pain around your surgical cut.  You have more fluid or blood coming from your surgical cut.  Your surgical cut feels warm to the touch.  You have pus or a bad smell coming from your surgical cut.  You have a fever or chills.  You have blood in your poop (stool).  You have not pooped in 2-3 days.  Medicine does not help your pain. Get help right away if:  You have chest pain or you are short of breath.  You feel light-headed.  You feel weak and dizzy (feel faint).  You have very bad pain.  You throw up (vomit) and your pain is worse. This information is not intended to replace advice given to you by your health care provider. Make sure you discuss any questions you have with your health care provider. Document Released: 08/14/2014 Document Revised: 02/11/2016 Document Reviewed: 01/05/2016 Elsevier Interactive Patient Education  2017 [employer] PATIENT INSTRUCTIONS POST-ANESTHESIA  IMMEDIATELY FOLLOWING SURGERY:  Do not drive or operate machinery for the first twenty four hours after surgery.  Do not make any important decisions for twenty four hours after surgery or while taking narcotic pain medications or sedatives.  If you develop intractable nausea and vomiting or a severe headache please notify your doctor immediately.  FOLLOW-UP:  Please make an appointment with your surgeon as instructed. You do not need to follow up with anesthesia unless specifically instructed to do so.  WOUND CARE INSTRUCTIONS (if applicable):  Keep a dry clean  dressing on the anesthesia/puncture wound site if there is drainage.  Once the wound has quit draining you may leave it open to air.  Generally you should leave the bandage intact for twenty four hours unless there is drainage.  If the epidural site drains for more than 36-48 hours please call the anesthesia department.  QUESTIONS?:  Please feel free to call your physician or the hospital operator if you have any questions, and they will be happy to assist you.

## 2016-08-16 NOTE — H&P (Signed)
  NTS SOAP Note  Vital Signs:  Vitals as of: [id number]: Systolic 123456: Diastolic 94: Heart Rate 70: Temp 98.81F (Temporal): Height 42ft 10in: Weight 152Lbs 0 Ounces: Pain Level 5: BMI 21.81   BMI : 21.81 kg/m2  Subjective: This 61 year old male presents for of  a right inguinal hernia. has been present for one month.  The patient does complain of discomfort when sticking out.  It reduces when lying down.  We made worse with straining or coughing.  Referred by Dr. Wenda Overland for further management and treatment.  No nausea or vomiting have been noted.  Review of Symptoms:  Constitutional:fatigue Head:negative Eyes:negative sinus problems Cardiovascular:negative Respiratory:dyspnea Gastrointestinabdominal pain, heartburn, dyspepsia Genitourinary:negative Musculoskeletal:joint, neck, and back pain Skin:negative Hematolgic/Lymphatic:negative Allergic/Immunologic:negative   Past Medical History:Reviewed  Past Medical History  Surgical History:  3 shoulder surgeries, hip replacement, neck surgery Medical Problems:  hypertension Allergies: nkda Medications:  amlodipine, tizanidine   Social History:Reviewed  Social History  Preferred Language: English Race:  Other Ethnicity: Not Hispanic / Latino Age: 89 year Marital Status:  S Alcohol: quit   Smoking Status: Current every day smoker reviewed on 08/15/2016 Started Date:  Packs per week:  Functional Status reviewed on 08/15/2016 ------------------------------------------------ Bathing: Normal Cooking: Normal Dressing: Normal Driving: Normal Eating: Normal Managing Meds: Normal Oral Care: Normal Shopping: Normal Toileting: Normal Transferring: Normal Walking: Normal Cognitive Status reviewed on 08/15/2016 ------------------------------------------------ Attention: Normal Decision Making: Normal Language: Normal Memory: Normal Motor: Normal Perception: Normal Problem Solving: Normal Visual and  Spatial: Normal   Family History:Reviewed  Family Health History Mother  Father, Living; Prostate cancer;     Objective Information: General:Well appearing, well nourished in no distress. Head:Atraumatic; no masses; no abnormalities Neck:Supple without lymphadenopathy.  Heart:RRR, no murmur or gallop.  Normal S1, S2.  No S3, S4.  Lungs:CTA bilaterally, no wheezes, rhonchi, rales.  Breathing unlabored. Abdomen:Soft, NT/ND, normal bowel sounds, no HSM, no masses.  No peritoneal signs.  Reducible right inguinal hernia noted. [id number] D.r Bluth's notes reviewed. Assessment:Right inguinal hernia  Diagnoses: 550.90  K40.90 Unilateral or unspecified inguinal hernia, without mention of obstruction or gangrene (not specified as recurrent)  Procedures: [id number] - OFFICE OUTPATIENT NEW 30 MINUTES    Plan:    Scheduled for right inguinal herniorrhaphy with mesh on 08/18/2016.   Patient Education:Alternative treatments to surgery were discussed with patient (and family).Risks and benefits  of procedure   Including bleeding, infection, mesh use, and the possibility of recurrence of the hernia were fully explained to the patient (and family) who gave informed consent. Patient/family questions were addressed.  Follow-up:Pending Surgery

## 2016-08-17 ENCOUNTER — Encounter (HOSPITAL_COMMUNITY): Payer: Self-pay

## 2016-08-17 ENCOUNTER — Encounter (HOSPITAL_COMMUNITY)
Admission: RE | Admit: 2016-08-17 | Discharge: 2016-08-17 | Disposition: A | Discharge status: Home / Self Care | Payer: Medicare Other | Source: Ambulatory Visit | Attending: General Surgery | Admitting: General Surgery

## 2016-08-17 DIAGNOSIS — F17200 Nicotine dependence, unspecified, uncomplicated: Secondary | ICD-10-CM | POA: Diagnosis not present

## 2016-08-17 DIAGNOSIS — K219 Gastro-esophageal reflux disease without esophagitis: Secondary | ICD-10-CM | POA: Diagnosis not present

## 2016-08-17 DIAGNOSIS — I10 Essential (primary) hypertension: Secondary | ICD-10-CM | POA: Diagnosis not present

## 2016-08-17 DIAGNOSIS — K4090 Unilateral inguinal hernia, without obstruction or gangrene, not specified as recurrent: Secondary | ICD-10-CM | POA: Diagnosis present

## 2016-08-17 DIAGNOSIS — Z8711 Personal history of peptic ulcer disease: Secondary | ICD-10-CM | POA: Diagnosis not present

## 2016-08-17 LAB — BASIC METABOLIC PANEL
Anion gap: 6 (ref 5–15)
BUN: 14 mg/dL (ref 6–20)
CO2: 27 mmol/L (ref 22–32)
CREATININE: 1.18 mg/dL (ref 0.61–1.24)
Calcium: 8.8 mg/dL — ABNORMAL LOW (ref 8.9–10.3)
Chloride: 103 mmol/L (ref 101–111)
GFR calc Af Amer: >60 mL/min (ref >60)
Glucose, Bld: 88 mg/dL (ref 65–99)
POTASSIUM: 3.9 mmol/L (ref 3.5–5.1)
SODIUM: 136 mmol/L (ref 135–145)

## 2016-08-17 LAB — CBC
HCT: 40.8 % (ref 39.0–52.0)
Hemoglobin: 14.1 g/dL (ref 13.0–17.0)
MCH: 32 pg (ref 26.0–34.0)
MCHC: 34.6 g/dL (ref 30.0–36.0)
MCV: 92.7 fL (ref 78.0–100.0)
PLATELETS: 217 10*3/uL (ref 150–400)
RBC: 4.4 MIL/uL (ref 4.22–5.81)
RDW: 12.6 % (ref 11.5–15.5)
WBC: 8 10*3/uL (ref 4.0–10.5)

## 2016-08-18 ENCOUNTER — Encounter (HOSPITAL_COMMUNITY): Payer: Self-pay

## 2016-08-18 ENCOUNTER — Ambulatory Visit (HOSPITAL_COMMUNITY)
Admission: RE | Admit: 2016-08-18 | Discharge: 2016-08-18 | Disposition: A | Discharge status: Home / Self Care | Payer: Medicare Other | Source: Ambulatory Visit | Attending: General Surgery | Admitting: General Surgery

## 2016-08-18 ENCOUNTER — Ambulatory Visit (HOSPITAL_COMMUNITY): Payer: Medicare Other | Admitting: Anesthesiology

## 2016-08-18 ENCOUNTER — Encounter (HOSPITAL_COMMUNITY)
Admission: RE | Disposition: A | Discharge status: Home / Self Care | Payer: Self-pay | Source: Ambulatory Visit | Attending: General Surgery

## 2016-08-18 DIAGNOSIS — F17200 Nicotine dependence, unspecified, uncomplicated: Secondary | ICD-10-CM | POA: Insufficient documentation

## 2016-08-18 DIAGNOSIS — I10 Essential (primary) hypertension: Secondary | ICD-10-CM | POA: Insufficient documentation

## 2016-08-18 DIAGNOSIS — K4090 Unilateral inguinal hernia, without obstruction or gangrene, not specified as recurrent: Secondary | ICD-10-CM | POA: Insufficient documentation

## 2016-08-18 DIAGNOSIS — K219 Gastro-esophageal reflux disease without esophagitis: Secondary | ICD-10-CM | POA: Diagnosis not present

## 2016-08-18 DIAGNOSIS — Z8711 Personal history of peptic ulcer disease: Secondary | ICD-10-CM | POA: Insufficient documentation

## 2016-08-18 HISTORY — PX: INGUINAL HERNIA REPAIR: SHX194

## 2016-08-18 SURGERY — REPAIR, HERNIA, INGUINAL, ADULT
Anesthesia: General | Site: Groin | Laterality: Right

## 2016-08-18 MED ORDER — BUPIVACAINE LIPOSOME 1.3 % IJ SUSP
INTRAMUSCULAR | Status: DC | PRN
  Administered 2016-08-18: 20 mL

## 2016-08-18 MED ORDER — ONDANSETRON HCL 4 MG/2ML IJ SOLN
INTRAMUSCULAR | Status: AC
  Filled 2016-08-18: qty 2

## 2016-08-18 MED ORDER — SODIUM CHLORIDE 0.9 % IR SOLN
Status: DC | PRN
  Administered 2016-08-18: 1000 mL

## 2016-08-18 MED ORDER — OXYCODONE-ACETAMINOPHEN 7.5-325 MG PO TABS: 1.0000 | ORAL_TABLET | ORAL | 0 refills | Status: DC | PRN

## 2016-08-18 MED ORDER — LACTATED RINGERS IV SOLN
INTRAVENOUS | Status: DC | PRN
  Administered 2016-08-18 (×2): via INTRAVENOUS

## 2016-08-18 MED ORDER — ROCURONIUM BROMIDE 50 MG/5ML IV SOLN
INTRAVENOUS | Status: AC
  Filled 2016-08-18: qty 2

## 2016-08-18 MED ORDER — ROCURONIUM BROMIDE 100 MG/10ML IV SOLN
INTRAVENOUS | Status: DC | PRN
  Administered 2016-08-18: 15 mg via INTRAVENOUS

## 2016-08-18 MED ORDER — BUPIVACAINE LIPOSOME 1.3 % IJ SUSP
INTRAMUSCULAR | Status: AC
  Filled 2016-08-18: qty 20

## 2016-08-18 MED ORDER — MIDAZOLAM HCL 2 MG/2ML IJ SOLN
0.5000 mg | INTRAMUSCULAR | Status: DC | PRN
  Administered 2016-08-18: 2 mg via INTRAVENOUS

## 2016-08-18 MED ORDER — SUCCINYLCHOLINE CHLORIDE 20 MG/ML IJ SOLN
INTRAMUSCULAR | Status: AC
  Filled 2016-08-18: qty 1

## 2016-08-18 MED ORDER — FENTANYL CITRATE (PF) 100 MCG/2ML IJ SOLN
INTRAMUSCULAR | Status: DC | PRN
  Administered 2016-08-18 (×4): 50 ug via INTRAVENOUS

## 2016-08-18 MED ORDER — HYDROMORPHONE HCL 1 MG/ML IJ SOLN
0.2500 mg | INTRAMUSCULAR | Status: DC | PRN
  Administered 2016-08-18 (×2): 0.5 mg via INTRAVENOUS
  Filled 2016-08-18 (×2): qty 0.5

## 2016-08-18 MED ORDER — FENTANYL CITRATE (PF) 100 MCG/2ML IJ SOLN
INTRAMUSCULAR | Status: AC
  Filled 2016-08-18: qty 2

## 2016-08-18 MED ORDER — GLYCOPYRROLATE 0.2 MG/ML IJ SOLN
INTRAMUSCULAR | Status: AC
  Filled 2016-08-18: qty 1

## 2016-08-18 MED ORDER — LIDOCAINE HCL (PF) 1 % IJ SOLN
INTRAMUSCULAR | Status: AC
  Filled 2016-08-18: qty 5

## 2016-08-18 MED ORDER — PROPOFOL 10 MG/ML IV BOLUS
INTRAVENOUS | Status: AC
  Filled 2016-08-18: qty 20

## 2016-08-18 MED ORDER — CEFAZOLIN SODIUM-DEXTROSE 2-4 GM/100ML-% IV SOLN
2.0000 g | INTRAVENOUS | Status: AC
  Administered 2016-08-18: 2 g via INTRAVENOUS
  Filled 2016-08-18: qty 100

## 2016-08-18 MED ORDER — GLYCOPYRROLATE 0.2 MG/ML IJ SOLN
INTRAMUSCULAR | Status: AC
  Filled 2016-08-18: qty 3

## 2016-08-18 MED ORDER — ONDANSETRON HCL 4 MG/2ML IJ SOLN
4.0000 mg | Freq: Once | INTRAMUSCULAR | Status: AC
  Administered 2016-08-18: 4 mg via INTRAVENOUS

## 2016-08-18 MED ORDER — NEOSTIGMINE METHYLSULFATE 10 MG/10ML IV SOLN
INTRAVENOUS | Status: DC | PRN
  Administered 2016-08-18: 4 mg via INTRAVENOUS

## 2016-08-18 MED ORDER — KETOROLAC TROMETHAMINE 30 MG/ML IJ SOLN
30.0000 mg | Freq: Once | INTRAMUSCULAR | Status: AC
  Administered 2016-08-18: 30 mg via INTRAVENOUS
  Filled 2016-08-18: qty 1

## 2016-08-18 MED ORDER — PROPOFOL 10 MG/ML IV BOLUS
INTRAVENOUS | Status: DC | PRN
  Administered 2016-08-18: 150 mg via INTRAVENOUS

## 2016-08-18 MED ORDER — CHLORHEXIDINE GLUCONATE CLOTH 2 % EX PADS: 6.0000 | MEDICATED_PAD | Freq: Once | CUTANEOUS | Status: DC

## 2016-08-18 MED ORDER — GLYCOPYRROLATE 0.2 MG/ML IJ SOLN
INTRAMUSCULAR | Status: DC | PRN
  Administered 2016-08-18: 0.6 mg via INTRAVENOUS

## 2016-08-18 MED ORDER — LABETALOL HCL 5 MG/ML IV SOLN
10.0000 mg | INTRAVENOUS | Status: DC | PRN
  Administered 2016-08-18: 10 mg via INTRAVENOUS

## 2016-08-18 MED ORDER — LACTATED RINGERS IV SOLN
INTRAVENOUS | Status: DC
  Administered 2016-08-18: 1000 mL via INTRAVENOUS

## 2016-08-18 MED ORDER — LABETALOL HCL 5 MG/ML IV SOLN
INTRAVENOUS | Status: AC
  Filled 2016-08-18: qty 4

## 2016-08-18 MED ORDER — GLYCOPYRROLATE 0.2 MG/ML IJ SOLN
0.2000 mg | Freq: Once | INTRAMUSCULAR | Status: AC
  Administered 2016-08-18: 0.2 mg via INTRAVENOUS

## 2016-08-18 MED ORDER — LIDOCAINE HCL (CARDIAC) 20 MG/ML IV SOLN
INTRAVENOUS | Status: DC | PRN
  Administered 2016-08-18: 30 mg via INTRAVENOUS

## 2016-08-18 MED ORDER — SUCCINYLCHOLINE CHLORIDE 20 MG/ML IJ SOLN
INTRAMUSCULAR | Status: DC | PRN
  Administered 2016-08-18: 140 mg via INTRAVENOUS

## 2016-08-18 MED ORDER — MIDAZOLAM HCL 2 MG/2ML IJ SOLN
INTRAMUSCULAR | Status: AC
  Filled 2016-08-18: qty 2

## 2016-08-18 SURGICAL SUPPLY — 40 items
BAG HAMPER (MISCELLANEOUS) ×3 IMPLANT
CLOTH BEACON ORANGE TIMEOUT ST (SAFETY) ×3 IMPLANT
COVER LIGHT HANDLE STERIS (MISCELLANEOUS) ×6 IMPLANT
DERMABOND ADVANCED (GAUZE/BANDAGES/DRESSINGS) ×2
DERMABOND ADVANCED .7 DNX12 (GAUZE/BANDAGES/DRESSINGS) ×1 IMPLANT
DRAIN PENROSE 18X1/2 LTX STRL (DRAIN) ×3 IMPLANT
ELECT REM PT RETURN 9FT ADLT (ELECTROSURGICAL) ×3
ELECTRODE REM PT RTRN 9FT ADLT (ELECTROSURGICAL) ×1 IMPLANT
GLOVE BIOGEL PI IND STRL 6.5 (GLOVE) ×1 IMPLANT
GLOVE BIOGEL PI IND STRL 7.0 (GLOVE) ×1 IMPLANT
GLOVE BIOGEL PI INDICATOR 6.5 (GLOVE) ×2
GLOVE BIOGEL PI INDICATOR 7.0 (GLOVE) ×2
GLOVE ECLIPSE 6.5 STRL STRAW (GLOVE) ×6 IMPLANT
GLOVE EXAM NITRILE MD LF STRL (GLOVE) ×3 IMPLANT
GLOVE SURG SS PI 7.5 STRL IVOR (GLOVE) ×3 IMPLANT
GOWN STRL REUS W/ TWL XL LVL3 (GOWN DISPOSABLE) ×1 IMPLANT
GOWN STRL REUS W/TWL LRG LVL3 (GOWN DISPOSABLE) ×6 IMPLANT
GOWN STRL REUS W/TWL XL LVL3 (GOWN DISPOSABLE) ×2
INST SET MINOR GENERAL (KITS) ×3 IMPLANT
KIT ROOM TURNOVER APOR (KITS) ×3 IMPLANT
MANIFOLD NEPTUNE II (INSTRUMENTS) ×3 IMPLANT
MESH HERNIA 1.6X1.9 PLUG LRG (Mesh General) ×1 IMPLANT
MESH HERNIA PLUG LRG (Mesh General) ×2 IMPLANT
NEEDLE HYPO 21X1.5 SAFETY (NEEDLE) ×3 IMPLANT
NS IRRIG 1000ML POUR BTL (IV SOLUTION) ×3 IMPLANT
PACK MINOR (CUSTOM PROCEDURE TRAY) ×3 IMPLANT
PAD ARMBOARD 7.5X6 YLW CONV (MISCELLANEOUS) ×3 IMPLANT
SCRUB PCMX 4 OZ (MISCELLANEOUS) ×3 IMPLANT
SET BASIN LINEN APH (SET/KITS/TRAYS/PACK) ×3 IMPLANT
SUT NOVA NAB GS-22 2 2-0 T-19 (SUTURE) ×9 IMPLANT
SUT PROLENE 2 0 SH 30 (SUTURE) IMPLANT
SUT SILK 3 0 (SUTURE)
SUT SILK 3-0 18XBRD TIE 12 (SUTURE) IMPLANT
SUT VIC AB 2-0 CT1 27 (SUTURE) ×3
SUT VIC AB 2-0 CT1 TAPERPNT 27 (SUTURE) ×1 IMPLANT
SUT VIC AB 3-0 SH 27 (SUTURE) ×3
SUT VIC AB 3-0 SH 27X BRD (SUTURE) ×1 IMPLANT
SUT VIC AB 4-0 PS2 27 (SUTURE) ×3 IMPLANT
SUT VICRYL AB 3 0 TIES (SUTURE) ×3 IMPLANT
SYR 20CC LL (SYRINGE) ×3 IMPLANT

## 2016-08-18 NOTE — Interval H&P Note (Signed)
History and Physical Interval Note:  08/18/2016 8:21 AM  Stephen Sullivan  has presented today for surgery, with the diagnosis of right inguinal hernia  The various methods of treatment have been discussed with the patient and family. After consideration of risks, benefits and other options for treatment, the patient has consented to  Procedure(s): HERNIA REPAIR INGUINAL ADULT WITH MESH (Right) as a surgical intervention .  The patient's history has been reviewed, patient examined, no change in status, stable for surgery.  I have reviewed the patient's chart and labs.  Questions were answered to the patient's satisfaction.     Aviva Signs A

## 2016-08-18 NOTE — Discharge Instructions (Signed)
Open Hernia Repair, Adult, Care After °This sheet gives you information about how to care for yourself after your procedure. Your health care provider may also give you more specific instructions. If you have problems or questions, contact your health care provider. °What can I expect after the procedure? °After the procedure, it is common to have: °· Mild discomfort. °· Slight bruising. °· Minor swelling. °· Pain in the abdomen. ° °Follow these instructions at home: °Incision care ° °· Follow instructions from your health care provider about how to take care of your incision area. Make sure you: °? Wash your hands with soap and water before you change your bandage (dressing). If soap and water are not available, use hand sanitizer. °? Change your dressing as told by your health care provider. °? Leave stitches (sutures), skin glue, or adhesive strips in place. These skin closures may need to stay in place for 2 weeks or longer. If adhesive strip edges start to loosen and curl up, you may trim the loose edges. Do not remove adhesive strips completely unless your health care provider tells you to do that. °· Check your incision area every day for signs of infection. Check for: °? More redness, swelling, or pain. °? More fluid or blood. °? Warmth. °? Pus or a bad smell. °Activity °· Do not drive or use heavy machinery while taking prescription pain medicine. Do not drive until your health care provider approves. °· Until your health care provider approves: °? Do not lift anything that is heavier than 10 lb (4.5 kg). °? Do not play contact sports. °· Return to your normal activities as told by your health care provider. Ask your health care provider what activities are safe. °General instructions °· To prevent or treat constipation while you are taking prescription pain medicine, your health care provider may recommend that you: °? Drink enough fluid to keep your urine clear or pale yellow. °? Take over-the-counter or  prescription medicines. °? Eat foods that are high in fiber, such as fresh fruits and vegetables, whole grains, and beans. °? Limit foods that are high in fat and processed sugars, such as fried and sweet foods. °· Take over-the-counter and prescription medicines only as told by your health care provider. °· Do not take tub baths or go swimming until your health care provider approves. °· Keep all follow-up visits as told by your health care provider. This is important. °Contact a health care provider if: °· You develop a rash. °· You have more redness, swelling, or pain around your incision. °· You have more fluid or blood coming from your incision. °· Your incision feels warm to the touch. °· You have pus or a bad smell coming from your incision. °· You have a fever or chills. °· You have blood in your stool (feces). °· You have not had a bowel movement in 2-3 days. °· Your pain is not controlled with medicine. °Get help right away if: °· You have chest pain or shortness of breath. °· You feel light-headed or feel faint. °· You have severe pain. °· You vomit and your pain is worse. °This information is not intended to replace advice given to you by your health care provider. Make sure you discuss any questions you have with your health care provider. °Document Released: 02/10/2005 Document Revised: 02/11/2016 Document Reviewed: 01/05/2016 °Elsevier Interactive Patient Education © 2017 [employer]. ° °PATIENT INSTRUCTIONS °POST-ANESTHESIA ° °IMMEDIATELY FOLLOWING SURGERY:  Do not drive or operate machinery for the   first twenty four hours after surgery.  Do not make any important decisions for twenty four hours after surgery or while taking narcotic pain medications or sedatives.  If you develop intractable nausea and vomiting or a severe headache please notify your doctor immediately. ° °FOLLOW-UP:  Please make an appointment with your surgeon as instructed. You do not need to follow up with anesthesia unless  specifically instructed to do so. ° °WOUND CARE INSTRUCTIONS (if applicable):  Keep a dry clean dressing on the anesthesia/puncture wound site if there is drainage.  Once the wound has quit draining you may leave it open to air.  Generally you should leave the bandage intact for twenty four hours unless there is drainage.  If the epidural site drains for more than 36-48 hours please call the anesthesia department. ° °QUESTIONS?:  Please feel free to call your physician or the hospital operator if you have any questions, and they will be happy to assist you.    ° ° ° ° °

## 2016-08-18 NOTE — Anesthesia Procedure Notes (Signed)
Procedure Name: Intubation Date/Time: 08/18/2016 9:08 AM Performed by: Andree Elk, Rozell Theiler A Pre-anesthesia Checklist: Patient identified, Patient being monitored, Timeout performed, Emergency Drugs available and Suction available Patient Re-evaluated:Patient Re-evaluated prior to inductionOxygen Delivery Method: Circle System Utilized Preoxygenation: Pre-oxygenation with 100% oxygen Intubation Type: IV induction, Cricoid Pressure applied and Rapid sequence Laryngoscope Size: Glidescope and 3 Grade View: Grade I Tube type: Oral Tube size: 7.0 mm Number of attempts: 1 Airway Equipment and Method: Stylet Placement Confirmation: ETT inserted through vocal cords under direct vision,  positive ETCO2 and breath sounds checked- equal and bilateral Secured at: 21 cm Tube secured with: Tape Dental Injury: Teeth and Oropharynx as per pre-operative assessment

## 2016-08-18 NOTE — Anesthesia Preprocedure Evaluation (Signed)
Anesthesia Evaluation  Patient identified by MRN, date of birth, ID band Patient awake    Reviewed: Allergy & Precautions, NPO status , Patient's Chart, lab work & pertinent test results  Airway Mallampati: III  TM Distance: >3 FB     Dental  (+) Teeth Intact, Dental Advisory Given   Pulmonary shortness of breath, Current Smoker (am cough),    breath sounds clear to auscultation       Cardiovascular hypertension,  Rhythm:Regular Rate:Normal     Neuro/Psych  Headaches, PSYCHIATRIC DISORDERS    GI/Hepatic PUD, GERD  ,  Endo/Other    Renal/GU      Musculoskeletal   Abdominal   Peds  Hematology   Anesthesia Other Findings   Reproductive/Obstetrics                             Anesthesia Physical Anesthesia Plan  ASA: III  Anesthesia Plan: General   Post-op Pain Management:    Induction: Intravenous, Rapid sequence and Cricoid pressure planned  Airway Management Planned: Oral ETT  Additional Equipment:   Intra-op Plan:   Post-operative Plan: Extubation in OR  Informed Consent: I have reviewed the patients History and Physical, chart, labs and discussed the procedure including the risks, benefits and alternatives for the proposed anesthesia with the patient or authorized representative who has indicated his/her understanding and acceptance.     Plan Discussed with:   Anesthesia Plan Comments:         Anesthesia Quick Evaluation

## 2016-08-18 NOTE — Transfer of Care (Signed)
Immediate Anesthesia Transfer of Care Note  Patient: Stephen Sullivan  Procedure(s) Performed: Procedure(s): RIGHT INGUINAL HERNORRHAPHY WITH MESH (Right)  Patient Location: PACU  Anesthesia Type:General  Level of Consciousness: awake, alert , oriented and patient cooperative  Airway & Oxygen Therapy: Patient Spontanous Breathing and Patient connected to face mask oxygen  Post-op Assessment: Report given to RN and Post -op Vital signs reviewed and stable  Post vital signs: Reviewed and stable  Last Vitals:  Vitals:   08/18/16 0845 08/18/16 0850  BP: 120/80   Pulse:    Resp: (!) 23 19  Temp:      Last Pain:  Vitals:   08/18/16 0719  TempSrc: Oral  PainSc: 0-No pain      Patients Stated Pain Goal: 6 ([id number])  Complications: No apparent anesthesia complications

## 2016-08-18 NOTE — Op Note (Signed)
Patient:  Stephen Sullivan  DOB:  09-Feb-1956  MRN:  [id number]   Preop Diagnosis:  Right inguinal hernia  Postop Diagnosis:  Same  Procedure:  Right inguinal herniorrhaphy with mesh  Surgeon:  Aviva Signs, M.D.  Anes:  Gen. endotracheal  Indications:  Patient is a 61 year old white male who presents with a symptomatic right inguinal hernia. The risks and benefits of the procedure including bleeding, infection, mesh use, and the possibility of recurrence of the hernia were fully explained to the patient, who gave informed consent.  Procedure note:  The patient was placed the supine position. After induction of general endotracheal anesthesia, the right groin region was prepped and draped using the usual sterile technique with CHG.  Surgical site confirmation was performed.  An incision was made in the right groin region down to the external oblique aponeuroses. The aponeuroses was incised to the external ring. A Penrose drain was placed around the spermatic cord. The vase deferens was noted within the spermatic cord. A high ligation of the ilioinguinal nerve was performed. The patient had a direct hernia sac. This was incised at its base and inverted. A large Bard PerFix plug was then inserted and secured circumferentially to the transversalis fascia using 2-0 Novafil interrupted sutures. An onlay patch was then placed along the floor of inguinal canal and secured superiorly to the conjoined tendon and inferiorly to the shelving edge of Poupart's ligament using 2-0 Novafil interrupted sutures. The internal ring was re-created using a 2-0 Novafil interrupted suture. The external oblique aponeuroses was reapproximated using a 2-0 Vicryl running suture. The subcutaneous layer was reapproximated using 3-0 Vicryl interrupted sutures.  Exparel was instilled into the surrounding wound. The skin was closed using a 4 Vicryl subcuticular suture. Dermabond was then applied.  All tape and needle counts  were correct at the end of the procedure. The patient was extubated in the operating room and transferred to PACU in stable condition.  Complications:  None  EBL:  Minimal  Specimen:  None

## 2016-08-18 NOTE — Anesthesia Postprocedure Evaluation (Signed)
Anesthesia Post Note Late Entry for 1025  Patient: Stephen Sullivan  Procedure(s) Performed: Procedure(s) (LRB): RIGHT INGUINAL HERNORRHAPHY WITH MESH (Right)  Patient location during evaluation: PACU Anesthesia Type: General Level of consciousness: awake and alert and oriented Pain management: pain level controlled Vital Signs Assessment: post-procedure vital signs reviewed and stable Respiratory status: spontaneous breathing Cardiovascular status: stable Postop Assessment: no signs of nausea or vomiting Anesthetic complications: no     Last Vitals:  Vitals:   08/18/16 1040 08/18/16 1045  BP:  (!) 148/99  Pulse: 67 66  Resp: (!) 21 13  Temp:      Last Pain:  Vitals:   08/18/16 1105  TempSrc:   PainSc: 3                  ADAMS, AMY A

## 2016-08-21 ENCOUNTER — Encounter (HOSPITAL_COMMUNITY): Payer: Self-pay | Admitting: General Surgery

## 2016-10-04 ENCOUNTER — Inpatient Hospital Stay (HOSPITAL_COMMUNITY)
Admission: EM | Admit: 2016-10-04 | Discharge: 2016-10-08 | DRG: 384 | Disposition: A | Discharge status: Home / Self Care | Payer: Medicare Other | Attending: Internal Medicine | Admitting: Internal Medicine

## 2016-10-04 ENCOUNTER — Encounter (HOSPITAL_COMMUNITY): Payer: Self-pay | Admitting: Emergency Medicine

## 2016-10-04 DIAGNOSIS — F17200 Nicotine dependence, unspecified, uncomplicated: Secondary | ICD-10-CM | POA: Diagnosis present

## 2016-10-04 DIAGNOSIS — F1290 Cannabis use, unspecified, uncomplicated: Secondary | ICD-10-CM | POA: Diagnosis present

## 2016-10-04 DIAGNOSIS — I10 Essential (primary) hypertension: Secondary | ICD-10-CM | POA: Diagnosis present

## 2016-10-04 DIAGNOSIS — K209 Esophagitis, unspecified without bleeding: Secondary | ICD-10-CM

## 2016-10-04 DIAGNOSIS — F1410 Cocaine abuse, uncomplicated: Secondary | ICD-10-CM | POA: Diagnosis present

## 2016-10-04 DIAGNOSIS — K250 Acute gastric ulcer with hemorrhage: Secondary | ICD-10-CM | POA: Diagnosis not present

## 2016-10-04 DIAGNOSIS — Z981 Arthrodesis status: Secondary | ICD-10-CM

## 2016-10-04 DIAGNOSIS — K449 Diaphragmatic hernia without obstruction or gangrene: Secondary | ICD-10-CM | POA: Diagnosis present

## 2016-10-04 DIAGNOSIS — M62838 Other muscle spasm: Secondary | ICD-10-CM | POA: Diagnosis present

## 2016-10-04 DIAGNOSIS — Z79899 Other long term (current) drug therapy: Secondary | ICD-10-CM

## 2016-10-04 DIAGNOSIS — K269 Duodenal ulcer, unspecified as acute or chronic, without hemorrhage or perforation: Secondary | ICD-10-CM | POA: Diagnosis present

## 2016-10-04 DIAGNOSIS — Z8711 Personal history of peptic ulcer disease: Secondary | ICD-10-CM | POA: Diagnosis not present

## 2016-10-04 DIAGNOSIS — K254 Chronic or unspecified gastric ulcer with hemorrhage: Secondary | ICD-10-CM | POA: Diagnosis not present

## 2016-10-04 DIAGNOSIS — Z96641 Presence of right artificial hip joint: Secondary | ICD-10-CM | POA: Diagnosis present

## 2016-10-04 DIAGNOSIS — R768 Other specified abnormal immunological findings in serum: Secondary | ICD-10-CM | POA: Diagnosis not present

## 2016-10-04 DIAGNOSIS — D500 Iron deficiency anemia secondary to blood loss (chronic): Secondary | ICD-10-CM | POA: Diagnosis present

## 2016-10-04 DIAGNOSIS — F17210 Nicotine dependence, cigarettes, uncomplicated: Secondary | ICD-10-CM | POA: Diagnosis present

## 2016-10-04 DIAGNOSIS — K920 Hematemesis: Secondary | ICD-10-CM | POA: Diagnosis not present

## 2016-10-04 DIAGNOSIS — K253 Acute gastric ulcer without hemorrhage or perforation: Secondary | ICD-10-CM | POA: Diagnosis present

## 2016-10-04 DIAGNOSIS — K210 Gastro-esophageal reflux disease with esophagitis: Secondary | ICD-10-CM | POA: Diagnosis present

## 2016-10-04 DIAGNOSIS — K921 Melena: Secondary | ICD-10-CM | POA: Diagnosis not present

## 2016-10-04 DIAGNOSIS — K922 Gastrointestinal hemorrhage, unspecified: Secondary | ICD-10-CM | POA: Diagnosis present

## 2016-10-04 DIAGNOSIS — Z791 Long term (current) use of non-steroidal anti-inflammatories (NSAID): Secondary | ICD-10-CM | POA: Diagnosis not present

## 2016-10-04 DIAGNOSIS — F1910 Other psychoactive substance abuse, uncomplicated: Secondary | ICD-10-CM | POA: Diagnosis not present

## 2016-10-04 LAB — CBC
HEMATOCRIT: 38 % — AB (ref 39.0–52.0)
HEMOGLOBIN: 13.6 g/dL (ref 13.0–17.0)
MCH: 31.9 pg (ref 26.0–34.0)
MCHC: 35.8 g/dL (ref 30.0–36.0)
MCV: 89 fL (ref 78.0–100.0)
Platelets: 239 10*3/uL (ref 150–400)
RBC: 4.27 MIL/uL (ref 4.22–5.81)
RDW: 12.5 % (ref 11.5–15.5)
WBC: 18 10*3/uL — ABNORMAL HIGH (ref 4.0–10.5)

## 2016-10-04 LAB — COMPREHENSIVE METABOLIC PANEL
ALBUMIN: 3.7 g/dL (ref 3.5–5.0)
ALK PHOS: 50 U/L (ref 38–126)
ALT: 11 U/L — ABNORMAL LOW (ref 17–63)
ANION GAP: 9 (ref 5–15)
AST: 17 U/L (ref 15–41)
BUN: 47 mg/dL — ABNORMAL HIGH (ref 6–20)
CALCIUM: 9.5 mg/dL (ref 8.9–10.3)
CO2: 24 mmol/L (ref 22–32)
Chloride: 103 mmol/L (ref 101–111)
Creatinine, Ser: 1.2 mg/dL (ref 0.61–1.24)
GFR calc Af Amer: >60 mL/min (ref >60)
GFR calc non Af Amer: >60 mL/min (ref >60)
GLUCOSE: 105 mg/dL — AB (ref 65–99)
Potassium: 3.9 mmol/L (ref 3.5–5.1)
SODIUM: 136 mmol/L (ref 135–145)
Total Bilirubin: 0.8 mg/dL (ref 0.3–1.2)
Total Protein: 6.5 g/dL (ref 6.5–8.1)

## 2016-10-04 LAB — POC OCCULT BLOOD, ED: Fecal Occult Bld: POSITIVE — AB

## 2016-10-04 LAB — PROTIME-INR
INR: 1.04
Prothrombin Time: 13.7 seconds (ref 11.4–15.2)

## 2016-10-04 LAB — LIPASE, BLOOD: LIPASE: 10 U/L — AB (ref 11–51)

## 2016-10-04 MED ORDER — ONDANSETRON HCL 4 MG PO TABS: 4.0000 mg | ORAL_TABLET | Freq: Four times a day (QID) | ORAL | Status: DC | PRN

## 2016-10-04 MED ORDER — LACTATED RINGERS IV SOLN
INTRAVENOUS | Status: DC
  Administered 2016-10-04 – 2016-10-07 (×7): via INTRAVENOUS

## 2016-10-04 MED ORDER — ACETAMINOPHEN 650 MG RE SUPP: 650.0000 mg | Freq: Four times a day (QID) | RECTAL | Status: DC | PRN

## 2016-10-04 MED ORDER — ACETAMINOPHEN 325 MG PO TABS: 650.0000 mg | ORAL_TABLET | Freq: Four times a day (QID) | ORAL | Status: DC | PRN

## 2016-10-04 MED ORDER — PANTOPRAZOLE SODIUM 40 MG IV SOLR
80.0000 mg | Freq: Once | INTRAVENOUS | Status: AC
  Administered 2016-10-04: 80 mg via INTRAVENOUS
  Filled 2016-10-04: qty 80

## 2016-10-04 MED ORDER — NICOTINE 14 MG/24HR TD PT24
14.0000 mg | MEDICATED_PATCH | Freq: Every day | TRANSDERMAL | Status: DC | PRN
  Administered 2016-10-05 – 2016-10-08 (×2): 14 mg via TRANSDERMAL
  Filled 2016-10-04 (×3): qty 1

## 2016-10-04 MED ORDER — PANTOPRAZOLE SODIUM 40 MG IV SOLR: 40.0000 mg | Freq: Two times a day (BID) | INTRAVENOUS | Status: DC

## 2016-10-04 MED ORDER — OXYCODONE-ACETAMINOPHEN 7.5-325 MG PO TABS
1.0000 | ORAL_TABLET | ORAL | Status: DC | PRN
  Administered 2016-10-05: 1 via ORAL
  Filled 2016-10-04: qty 1

## 2016-10-04 MED ORDER — ONDANSETRON HCL 4 MG/2ML IJ SOLN: 4.0000 mg | Freq: Four times a day (QID) | INTRAMUSCULAR | Status: DC | PRN

## 2016-10-04 MED ORDER — ONDANSETRON HCL 4 MG/2ML IJ SOLN
4.0000 mg | Freq: Once | INTRAMUSCULAR | Status: AC
  Administered 2016-10-04: 4 mg via INTRAVENOUS
  Filled 2016-10-04: qty 2

## 2016-10-04 MED ORDER — TIZANIDINE HCL 4 MG PO TABS: 4.0000 mg | ORAL_TABLET | Freq: Four times a day (QID) | ORAL | Status: DC | PRN

## 2016-10-04 MED ORDER — AMLODIPINE BESYLATE 5 MG PO TABS
10.0000 mg | ORAL_TABLET | Freq: Every day | ORAL | Status: DC
  Administered 2016-10-07 – 2016-10-08 (×2): 10 mg via ORAL
  Filled 2016-10-04 (×3): qty 2

## 2016-10-04 MED ORDER — SODIUM CHLORIDE 0.9 % IV BOLUS (SEPSIS)
1000.0000 mL | Freq: Once | INTRAVENOUS | Status: AC
  Administered 2016-10-04: 1000 mL via INTRAVENOUS

## 2016-10-04 NOTE — ED Provider Notes (Signed)
Emergency Department Provider Note   I have reviewed the triage vital signs and the nursing notes.   HISTORY  Chief Complaint GI Bleeding   HPI Stephen Sullivan is a 61 y.o. male with PMH of GERD, HTN, and gastric ulcer presents to the ED for evaluation of black vomiting and black BMs for the last 24 hours. Symptoms began suddenly and has been worsening throughout the evening. No history of similar bleeding. He has a remote EtOH history and a know gastric ulcer that was diagnosed "a Zahria Ding time ago." Denies any abdominal pain or chest pain. No difficulty breathing. No fever or chills. He does have some lightheadedness. No modifying factors. No radiating symptoms. Bleeding is described as severe and frequent.   Past Medical History:  Diagnosis Date  . Arthritis   . Back pain   . Cataracts, bilateral    immature  . GERD (gastroesophageal reflux disease)    using Baking Soda and Water per pt  . Headache(784.0)    related to neck issues  . History of bronchitis   . History of gastric ulcer 20+yrs ago  . Hypertension    takes Benazepril some days  . Joint pain   . Muscle spasm    takes Flexeril daily  . Shortness of breath    with exertion  . Weakness    and numbness in left fingers    Patient Active Problem List   Diagnosis Date Noted  . GI bleed 10/04/2016  . Cervical spondylosis 12/18/2013  . Adjustment disorder 12/20/2011    Past Surgical History:  Procedure Laterality Date  . ANTERIOR CERVICAL DECOMP/DISCECTOMY FUSION N/A 12/18/2013   Procedure: ANTERIOR CERVICAL DECOMPRESSION/DISCECTOMY FUSION CERVICAL THREE-FOUR, FOUR-FIVE, FIVE-SIX W/ BONEGRAFT;  Surgeon: Hosie Spangle, MD;  Location: [place] [facility];  Service: Neurosurgery;  Laterality: N/A;  ANTERIOR CERVICAL DECOMPRESSION/DISCECTOMY FUSION CERVICAL THREE-FOUR, FOUR-FIVE, FIVE-SIX W/ BONEGRAFT  . arm surgery Left   . INGUINAL HERNIA REPAIR Right 08/18/2016   Procedure: RIGHT INGUINAL HERNORRHAPHY WITH MESH;   Surgeon: Aviva Signs, MD;  Location: [facility];  Service: General;  Laterality: Right;  . JOINT REPLACEMENT Right    hip   . SHOULDER SURGERY Right    x 3     Current Outpatient Rx  . Order #: [id number] Class: Historical Med  . Order #: [id number] Class: Historical Med  . Order #: [id number] Class: Historical Med  . Order #: [id number] Class: Historical Med    Allergies Patient has no known allergies.  Family History  Problem Relation Age of Onset  . Prostate cancer Father   . Prostate cancer Brother     Social History Social History  Substance Use Topics  . Smoking status: Current Every Day Smoker    Packs/day: 0.50    Years: 30.00    Types: Cigarettes  . Smokeless tobacco: Never Used  . Alcohol use No    Review of Systems  Constitutional: No fever/chills Eyes: No visual changes. ENT: No sore throat. Cardiovascular: Denies chest pain. Respiratory: Denies shortness of breath. Gastrointestinal: No abdominal pain. Positive nausea and black vomiting. Positive black diarrhea.  No constipation. Genitourinary: Negative for dysuria. Musculoskeletal: Negative for back pain. Skin: Negative for rash. Neurological: Negative for headaches, focal weakness or numbness.  10-point ROS otherwise negative.  ____________________________________________   PHYSICAL EXAM:  VITAL SIGNS: ED Triage Vitals  Enc Vitals Group     BP 10/04/16 1434 139/96     Pulse Rate 10/04/16 1434 110     Resp 10/04/16 1434 25  Temp 10/04/16 1434 97.7 F (36.5 C)     Temp Source 10/04/16 1434 Oral     SpO2 10/04/16 1434 99 %     Weight 10/04/16 1435 160 lb (72.6 kg)     Height 10/04/16 1435 5\' 10"  (1.778 m)     Pain Score 10/04/16 1436 5   Constitutional: Alert and oriented. Appears pale and generally ill but no acute distress.  Eyes: Conjunctivae are normal.  Head: Atraumatic. Nose: No congestion/rhinnorhea. Mouth/Throat: Mucous membranes are moist.  Oropharynx non-erythematous. Neck: No  stridor.   Cardiovascular: Tachycardia. Good peripheral circulation. Grossly normal heart sounds.   Respiratory: Normal respiratory effort.  No retractions. Lungs CTAB. Gastrointestinal: Soft and nontender. No distention. Black stool per rectum. No BRB.  Musculoskeletal: No lower extremity tenderness nor edema. No gross deformities of extremities. Neurologic:  Normal speech and language. No gross focal neurologic deficits are appreciated.  Skin:  Skin is warm, dry and intact. No rash noted. Psychiatric: Mood and affect are normal. Speech and behavior are normal.  ____________________________________________   LABS (all labs ordered are listed, but only abnormal results are displayed)  Labs Reviewed  COMPREHENSIVE METABOLIC PANEL - Abnormal; Notable for the following:       Result Value   Glucose, Bld 105 (*)    BUN 47 (*)    ALT 11 (*)    All other components within normal limits  CBC - Abnormal; Notable for the following:    WBC 18.0 (*)    HCT 38.0 (*)    All other components within normal limits  LIPASE, BLOOD - Abnormal; Notable for the following:    Lipase 10 (*)    All other components within normal limits  POC OCCULT BLOOD, ED - Abnormal; Notable for the following:    Fecal Occult Bld POSITIVE (*)    All other components within normal limits  PROTIME-INR  TYPE AND SCREEN   ____________________________________________  EKG   EKG Interpretation  Date/Time:  Wednesday October 04 2016 15:34:39 EST Ventricular Rate:  113 PR Interval:    QRS Duration: 92 QT Interval:  307 QTC Calculation: 421 R Axis:   7 Text Interpretation:  Sinus tachycardia Right atrial enlargement Borderline low voltage, extremity leads Abnormal R-wave progression, early transition Borderline repolarization abnormality No STEMI.  Confirmed by Mattison Golay MD, Aranda Bihm [phone]) on 10/04/2016 5:20:14 PM        ____________________________________________  RADIOLOGY  None ____________________________________________   PROCEDURES  Procedure(s) performed:   Procedures  None ____________________________________________   INITIAL IMPRESSION / ASSESSMENT AND PLAN / ED COURSE  Pertinent labs & imaging results that were available during my care of the patient were reviewed by me and considered in my medical decision making (see chart for details).  Patient presents to the ED for evaluation of black stool per rectum and black emesis. Does have a remote EtOH history and history of gastric ulcer but no severe bleeding prior. No anticoagulation. Non-tender abdomen to palpation. No CP or SOB. Patient has tachycardia but normal BP. Appears pale and overall ill. Have started IVF and Protonix bolus. Black stool on exam.   Spoke with GI Dr. Laural Golden who will consult on the patient and likely perform endoscopy tomorrow.   Discussed patient's case with hosptialist, Dr. Lorin Mercy. Patient and family (if present) updated with plan. Care transferred to Hospitalist service.  I reviewed all nursing notes, vitals, pertinent old records, EKGs, labs, imaging (as available).  ____________________________________________  FINAL CLINICAL IMPRESSION(S) / ED DIAGNOSES  Final diagnoses:  Gastrointestinal hemorrhage, unspecified gastrointestinal hemorrhage type     MEDICATIONS GIVEN DURING THIS VISIT:  Medications  pantoprazole (PROTONIX) injection 80 mg (80 mg Intravenous Given 10/04/16 1531)  sodium chloride 0.9 % bolus 1,000 mL (1,000 mLs Intravenous New Bag/Given 10/04/16 1532)  ondansetron (ZOFRAN) injection 4 mg (4 mg Intravenous Given 10/04/16 1531)     NEW OUTPATIENT MEDICATIONS STARTED DURING THIS VISIT:  None   Note:  This document was prepared using Dragon voice recognition software and may include unintentional dictation errors.  Nanda Quinton, MD Emergency Medicine   Margette Fast,  MD 10/04/16 [phone]

## 2016-10-04 NOTE — H&P (Addendum)
History and Physical    Stephen Sullivan [id number] DOB: [birth year] DOA: 10/04/2016  PCP: Celedonio Savage, MD Consultants:  None Patient coming from: home - lives alone; NOK: Mother, [phone]  Chief Complaint: GI bleeding  HPI: Stephen Sullivan is a 60 y.o. male with medical history significant of remote GI bleed from a gastric ulcer; HTN; and arthritis presenting because "I was dying, if I hadn't of came I wouldn't be here."  30 years ago he had a "baby ulcer".  This time, he reports "That thing was stopping me in my tracks, putting me down on my knees."  Severe pain started yesterday, vomiting black/bloody, diaphoresis.  Developed diarrhea, black/bloody.  Diarrhea so much "my butt's raw." Emesis maybe 30 episodes.  Last emesis just before coming to the ER, last diarrhea was this AM.  Currently tolerating ice chips without difficulty.  Too light-headed to walk when EMS arrived.  Started having abdominal pain about 3 days ago after he got upset from an argument with his ex-girlfriend who owes him a bunch of money.  Does not take ASA and NSAIDs (although appears to have been taking a prescription for Naproxen so perhaps he was unaware this is an NSAID), does not take any pain medication.  No ETOH, h/o heavy use, none in about 10 years.  +marijuana, last use 4-5 days ago.  Used cocaine last about the same time.   He reports that he has never had a colonoscopy.   ED Course: Black stool on exam.  Given IVF and Protonix 80 mg IV.  Called Dr. Laural Golden who will see and scope tomorrow.  Review of Systems: As per HPI; otherwise 10 point review of systems reviewed and negative.   Ambulatory Status:  Carries crutches for assistance when needed  Past Medical History:  Diagnosis Date  . Arthritis   . Back pain   . Cataracts, bilateral    immature  . GERD (gastroesophageal reflux disease)    using Baking Soda and Water per pt  . Headache(784.0)    related to neck issues  . History of bronchitis     . History of gastric ulcer 20+yrs ago  . Hypertension    takes Benazepril some days  . Joint pain   . Muscle spasm    takes Flexeril daily  . Shortness of breath    with exertion  . Weakness    and numbness in left fingers    Past Surgical History:  Procedure Laterality Date  . ANTERIOR CERVICAL DECOMP/DISCECTOMY FUSION N/A 12/18/2013   Procedure: ANTERIOR CERVICAL DECOMPRESSION/DISCECTOMY FUSION CERVICAL THREE-FOUR, FOUR-FIVE, FIVE-SIX W/ BONEGRAFT;  Surgeon: Hosie Spangle, MD;  Location: [place] [facility];  Service: Neurosurgery;  Laterality: N/A;  ANTERIOR CERVICAL DECOMPRESSION/DISCECTOMY FUSION CERVICAL THREE-FOUR, FOUR-FIVE, FIVE-SIX W/ BONEGRAFT  . arm surgery Left   . INGUINAL HERNIA REPAIR Right 08/18/2016   Procedure: RIGHT INGUINAL HERNORRHAPHY WITH MESH;  Surgeon: Aviva Signs, MD;  Location: [facility];  Service: General;  Laterality: Right;  . JOINT REPLACEMENT Right    hip   . SHOULDER SURGERY Right    x 3     Social History   Social History  . Marital status: Single    Spouse name: N/A  . Number of children: N/A  . Years of education: N/A   Occupational History  . disabled    Social History Main Topics  . Smoking status: Current Every Day Smoker    Packs/day: 0.50    Years: 47.00    Types:  Cigarettes  . Smokeless tobacco: Never Used  . Alcohol use No  . Drug use: Yes    Types: Marijuana, Cocaine  . Sexual activity: Not Currently   Other Topics Concern  . Not on file   Social History Narrative  . No narrative on file    No Known Allergies  Family History  Problem Relation Age of Onset  . Prostate cancer Father   . Prostate cancer Brother     Prior to Admission medications   Medication Sig Start Date End Date Taking? Authorizing Provider  amLODipine (NORVASC) 10 MG tablet Take 10 mg by mouth daily. 07/12/16  Yes Historical Provider, MD  naproxen (NAPROSYN) 500 MG tablet Take 500 mg by mouth daily as needed for mild pain or moderate pain.   Yes  Historical Provider, MD  oxyCODONE-acetaminophen (PERCOCET) 7.5-325 MG tablet Take 1 tablet by mouth every 4 (four) hours as needed for severe pain.   Yes Historical Provider, MD  tiZANidine (ZANAFLEX) 4 MG tablet Take 4 mg by mouth 4 (four) times daily as needed. 09/25/16  Yes Historical Provider, MD    Physical Exam: Vitals:   10/04/16 1700 10/04/16 1730 10/04/16 1836 10/04/16 2157  BP: 146/98 141/93 (!) 129/50 (!) 127/58  Pulse: 98 93 100 (!) 101  Resp:   18 20  Temp:   98.9 F (37.2 C) 98.3 F (36.8 C)  TempSrc:   Oral Oral  SpO2: 100% 98% 98% 97%  Weight:      Height:   5\' 11"  (1.803 m)      General:  Appears calm and comfortable and is NAD Eyes:  PERRL, EOMI, normal lids, iris ENT:  grossly normal hearing, lips & tongue, mmm Neck:  no LAD, masses or thyromegaly Cardiovascular:  RRR, no m/r/g. No LE edema.  Respiratory:  CTA bilaterally, no w/r/r. Normal respiratory effort. Abdomen:  soft, midepigastric tenderness, nd, NABS Skin:  no rash or induration seen on limited exam Musculoskeletal:  grossly normal tone BUE/BLE, good ROM, no bony abnormality Psychiatric:  grossly normal mood and affect, speech fluent and appropriate, AOx3 Neurologic:  CN 2-12 grossly intact, moves all extremities in coordinated fashion, sensation intact  Labs on Admission: I have personally reviewed following labs and imaging studies  CBC:  Recent Labs Lab 10/04/16 1518  WBC 18.0*  HGB 13.6  HCT 38.0*  MCV 89.0  PLT [id number]   Basic Metabolic Panel:  Recent Labs Lab 10/04/16 1518  NA 136  K 3.9  CL 103  CO2 24  GLUCOSE 105*  BUN 47*  CREATININE 1.20  CALCIUM 9.5   GFR: Estimated Creatinine Clearance: 67.2 mL/min (by C-G formula based on SCr of 1.2 mg/dL). Liver Function Tests:  Recent Labs Lab 10/04/16 1518  AST 17  ALT 11*  ALKPHOS 50  BILITOT 0.8  PROT 6.5  ALBUMIN 3.7    Recent Labs Lab 10/04/16 1521  LIPASE 10*   No results for input(s): AMMONIA in the last 168  hours. Coagulation Profile:  Recent Labs Lab 10/04/16 1521  INR 1.04   Cardiac Enzymes: No results for input(s): CKTOTAL, CKMB, CKMBINDEX, TROPONINI in the last 168 hours. BNP (last 3 results) No results for input(s): PROBNP in the last 8760 hours. HbA1C: No results for input(s): HGBA1C in the last 72 hours. CBG: No results for input(s): GLUCAP in the last 168 hours. Lipid Profile: No results for input(s): CHOL, HDL, LDLCALC, TRIG, CHOLHDL, LDLDIRECT in the last 72 hours. Thyroid Function Tests: No results for input(s):  TSH, T4TOTAL, FREET4, T3FREE, THYROIDAB in the last 72 hours. Anemia Panel: No results for input(s): VITAMINB12, FOLATE, FERRITIN, TIBC, IRON, RETICCTPCT in the last 72 hours. Urine analysis:    Component Value Date/Time   COLORURINE YELLOW 06/01/2013 1042   APPEARANCEUR CLEAR 06/01/2013 1042   LABSPEC >1.030 (H) 06/01/2013 1042   PHURINE 5.5 06/01/2013 1042   GLUCOSEU NEGATIVE 06/01/2013 1042   HGBUR TRACE (A) 06/01/2013 1042   BILIRUBINUR NEGATIVE 06/01/2013 1042   KETONESUR 15 (A) 06/01/2013 1042   PROTEINUR NEGATIVE 06/01/2013 1042   UROBILINOGEN 0.2 06/01/2013 1042   NITRITE NEGATIVE 06/01/2013 1042   LEUKOCYTESUR NEGATIVE 06/01/2013 1042    Creatinine Clearance: Estimated Creatinine Clearance: 67.2 mL/min (by C-G formula based on SCr of 1.2 mg/dL).  Sepsis Labs: @LABRCNTIP (procalcitonin:4,lacticidven:4) )No results found for this or any previous visit (from the past 240 hour(s)).   Radiological Exams on Admission: No results found.  EKG: Independently reviewed.  Sinus tachycardia with rate 113; early repolarization, nonspecific ST changes with no evidence of acute ischemia  Assessment/Plan Principal Problem:   GI bleed Active Problems:   Polysubstance abuse   Tobacco dependence   GI bleed -BUN 47, creatinine 1.20 -WBC 18, 8.0 on 08/17/16 -Hgb 13.6, 14.1 on 08/17/16 -INR 1.04 -Heme positive -Patient's lightheadedness and fatigue are  most likely caused by anemia secondary to upper GI bleeding.  -Patient has history of heavy drinking and so alcoholic cirrhosis is a consideration. -Another potential diagnosis is gastric or duodenal ulcer - patient does have midepigastric pain and has a h/o gastric ulcer remotely. -He has been apparently taking Naproxen, possibly without realizing it is an NSAID. -Hemoglobin is currently normal but he appears to be hemoconcentrated. -Type and screen were done in ED.  -Will trend CBC q6h with plan to transfuse for Hgb <7 or decreasing Hgb with persistent bloody stools. - will admit to med surg bed - GI consulted by Ed, will follow up recommendations - NPO for possible EGD tomorrow -If EGD is negative, will need colonoscopy; if EGD is positive, still needs colonoscopy but likely okay to have as an outpatient (he has never had one). - LR at 125 mL/hr - Start IV pantoprazole 40 mg bid (received 80 mg loading dose in ER) - Zofran IV for nausea - Avoid NSAIDs and SQ heparin - Maintain IV access (2 large bore IVs if possible). - Monitor closely and follow q6h cbc, transfuse as necessary.  Polysubstance abuse -While patient minimized use, he did acknowledge use of marijuana and cocaine recently -UDS pending. -Will order substance abuse counseling. -Tobacco Dependence: encourage cessation.  This was discussed with the patient and should be reviewed on an ongoing basis.  Patch ordered.   DVT prophylaxis: SCDs Code Status:  Full - confirmed with patient Family Communication: None present  Disposition Plan:  Home once clinically improved Consults called: SW, GI  Admission status: Admit - It is my clinical opinion that admission to INPATIENT is reasonable and necessary because this patient will require at least 2 midnights in the hospital to treat this condition based on the medical complexity of the problems presented.  Given the aforementioned information, the predictability of an adverse outcome  is felt to be significant.    Karmen Bongo MD [facility]  If 7PM-7AM, please contact night-coverage [email or web address] Password [facility]  10/04/2016, 10:57 PM

## 2016-10-04 NOTE — ED Triage Notes (Signed)
Patient arrives via EMS from home with complaints of abdominal pain and vomiting/diarrhea. Bloody emesis/stool. Patient had inguinal hernia repair 1 month ago, no complications per pt. Patient does admit to gastric ulcer years ago. Per patient, no ETOH for 10 years, but before quitting, he had a 20 year history of binge drinking beer/liquor. Patient is alert, oriented. Pale and clammy. C/o mid abdominal pain 5/10 on NPS.

## 2016-10-05 ENCOUNTER — Encounter (HOSPITAL_COMMUNITY)
Admission: EM | Disposition: A | Discharge status: Home / Self Care | Payer: Self-pay | Source: Home / Self Care | Attending: Internal Medicine

## 2016-10-05 ENCOUNTER — Encounter (HOSPITAL_COMMUNITY): Payer: Self-pay | Admitting: Gastroenterology

## 2016-10-05 ENCOUNTER — Inpatient Hospital Stay (HOSPITAL_COMMUNITY): Payer: Medicare Other | Admitting: Anesthesiology

## 2016-10-05 DIAGNOSIS — K921 Melena: Secondary | ICD-10-CM

## 2016-10-05 DIAGNOSIS — K254 Chronic or unspecified gastric ulcer with hemorrhage: Secondary | ICD-10-CM

## 2016-10-05 DIAGNOSIS — F17200 Nicotine dependence, unspecified, uncomplicated: Secondary | ICD-10-CM

## 2016-10-05 DIAGNOSIS — F1910 Other psychoactive substance abuse, uncomplicated: Secondary | ICD-10-CM

## 2016-10-05 DIAGNOSIS — K920 Hematemesis: Secondary | ICD-10-CM

## 2016-10-05 HISTORY — PX: ESOPHAGOGASTRODUODENOSCOPY (EGD) WITH PROPOFOL: SHX5813

## 2016-10-05 LAB — CBC
HCT: 30.4 % — ABNORMAL LOW (ref 39.0–52.0)
HCT: 32.1 % — ABNORMAL LOW (ref 39.0–52.0)
HEMOGLOBIN: 10.6 g/dL — AB (ref 13.0–17.0)
Hemoglobin: 11.4 g/dL — ABNORMAL LOW (ref 13.0–17.0)
MCH: 31.4 pg (ref 26.0–34.0)
MCH: 31.9 pg (ref 26.0–34.0)
MCHC: 34.9 g/dL (ref 30.0–36.0)
MCHC: 35.5 g/dL (ref 30.0–36.0)
MCV: 89.9 fL (ref 78.0–100.0)
MCV: 89.9 fL (ref 78.0–100.0)
PLATELETS: 167 10*3/uL (ref 150–400)
PLATELETS: 198 10*3/uL (ref 150–400)
RBC: 3.38 MIL/uL — ABNORMAL LOW (ref 4.22–5.81)
RBC: 3.57 MIL/uL — AB (ref 4.22–5.81)
RDW: 12.6 % (ref 11.5–15.5)
RDW: 12.5 % (ref 11.5–15.5)
WBC: 7.5 10*3/uL (ref 4.0–10.5)
WBC: 7.4 10*3/uL (ref 4.0–10.5)

## 2016-10-05 LAB — TYPE AND SCREEN
ABO/RH(D): O POS
Antibody Screen: NEGATIVE
UNIT DIVISION: 0
Unit division: 0
Unit division: 0

## 2016-10-05 LAB — RAPID URINE DRUG SCREEN, HOSP PERFORMED
AMPHETAMINES: NOT DETECTED
BENZODIAZEPINES: NOT DETECTED
Barbiturates: NOT DETECTED
COCAINE: POSITIVE — AB
OPIATES: NOT DETECTED
Tetrahydrocannabinol: POSITIVE — AB

## 2016-10-05 LAB — BPAM RBC
BLOOD PRODUCT EXPIRATION DATE: 201804042359
Blood Product Expiration Date: 201803132359
Blood Product Expiration Date: 201804072359
ISSUE DATE / TIME: 201803011231
ISSUE DATE / TIME: 201803011846
ISSUE DATE / TIME: 201803011957
UNIT TYPE AND RH: 5100
Unit Type and Rh: 5100
Unit Type and Rh: 9500

## 2016-10-05 LAB — BASIC METABOLIC PANEL
ANION GAP: 9 (ref 5–15)
BUN: 32 mg/dL — AB (ref 6–20)
CALCIUM: 8.4 mg/dL — AB (ref 8.9–10.3)
CO2: 21 mmol/L — AB (ref 22–32)
CREATININE: 1.04 mg/dL (ref 0.61–1.24)
Chloride: 106 mmol/L (ref 101–111)
GFR calc Af Amer: >60 mL/min (ref >60)
GLUCOSE: 90 mg/dL (ref 65–99)
Potassium: 3.6 mmol/L (ref 3.5–5.1)
Sodium: 136 mmol/L (ref 135–145)

## 2016-10-05 LAB — ABO/RH: ABO/RH(D): O POS

## 2016-10-05 LAB — PREPARE RBC (CROSSMATCH)

## 2016-10-05 SURGERY — ESOPHAGOGASTRODUODENOSCOPY (EGD) WITH PROPOFOL
Anesthesia: Monitor Anesthesia Care

## 2016-10-05 MED ORDER — PROPOFOL 500 MG/50ML IV EMUL
INTRAVENOUS | Status: DC | PRN
  Administered 2016-10-05: 150 ug/kg/min via INTRAVENOUS

## 2016-10-05 MED ORDER — EPINEPHRINE PF 1 MG/10ML IJ SOSY
PREFILLED_SYRINGE | INTRAMUSCULAR | Status: AC
Start: 2016-10-05 — End: 2016-10-05
  Filled 2016-10-05: qty 10

## 2016-10-05 MED ORDER — SODIUM CHLORIDE 0.9 % IV SOLN
80.0000 mg | Freq: Once | INTRAVENOUS | Status: AC
  Administered 2016-10-05: 13:00:00 80 mg via INTRAVENOUS
  Filled 2016-10-05: qty 80

## 2016-10-05 MED ORDER — LIDOCAINE HCL (PF) 1 % IJ SOLN
INTRAMUSCULAR | Status: AC
  Filled 2016-10-05: qty 5

## 2016-10-05 MED ORDER — MIDAZOLAM HCL 2 MG/2ML IJ SOLN
1.0000 mg | INTRAMUSCULAR | Status: DC
  Administered 2016-10-05: 2 mg via INTRAVENOUS

## 2016-10-05 MED ORDER — LACTATED RINGERS IV SOLN: INTRAVENOUS | Status: DC

## 2016-10-05 MED ORDER — SODIUM CHLORIDE 0.9 % IV SOLN
8.0000 mg/h | INTRAVENOUS | Status: DC
  Administered 2016-10-05 – 2016-10-08 (×7): 8 mg/h via INTRAVENOUS
  Filled 2016-10-05 (×8): qty 80

## 2016-10-05 MED ORDER — SODIUM CHLORIDE 0.9 % IJ SOLN
PREFILLED_SYRINGE | INTRAMUSCULAR | Status: DC | PRN
  Administered 2016-10-05: 3.5 mL

## 2016-10-05 MED ORDER — LIDOCAINE VISCOUS 2 % MT SOLN
15.0000 mL | Freq: Once | OROMUCOSAL | Status: AC
  Administered 2016-10-05: 15 mL via OROMUCOSAL

## 2016-10-05 MED ORDER — SODIUM CHLORIDE 0.9 % IV SOLN
Freq: Once | INTRAVENOUS | Status: AC
  Administered 2016-10-05: 10:00:00 via INTRAVENOUS

## 2016-10-05 MED ORDER — FENTANYL CITRATE (PF) 100 MCG/2ML IJ SOLN
25.0000 ug | INTRAMUSCULAR | Status: AC
  Administered 2016-10-05 (×2): 25 ug via INTRAVENOUS

## 2016-10-05 MED ORDER — LIDOCAINE HCL (CARDIAC) 10 MG/ML IV SOLN
INTRAVENOUS | Status: DC | PRN
Start: 2016-10-05 — End: 2016-10-05
  Administered 2016-10-05: 50 mg via INTRAVENOUS

## 2016-10-05 MED ORDER — MIDAZOLAM HCL 5 MG/5ML IJ SOLN
INTRAMUSCULAR | Status: DC | PRN
  Administered 2016-10-05: 2 mg via INTRAVENOUS

## 2016-10-05 MED ORDER — SODIUM CHLORIDE 0.9 % IV SOLN
INTRAVENOUS | Status: DC
  Administered 2016-10-05: 10:00:00 via INTRAVENOUS

## 2016-10-05 MED ORDER — LIDOCAINE VISCOUS 2 % MT SOLN
OROMUCOSAL | Status: AC
  Filled 2016-10-05: qty 15

## 2016-10-05 MED ORDER — FENTANYL CITRATE (PF) 100 MCG/2ML IJ SOLN
INTRAMUSCULAR | Status: AC
  Filled 2016-10-05: qty 2

## 2016-10-05 MED ORDER — MIDAZOLAM HCL 2 MG/2ML IJ SOLN
INTRAMUSCULAR | Status: AC
  Filled 2016-10-05: qty 2

## 2016-10-05 NOTE — Progress Notes (Signed)
PROGRESS NOTE                                                                                                                                                                                                             Patient Demographics:    Stephen Sullivan, is a 61 y.o. male, DOB - [birth year], [id number]  Admit date - 10/04/2016   Admitting Physician Karmen Bongo, MD  Outpatient Primary MD for the patient is Celedonio Savage, MD  LOS - 1  Chief Complaint  Patient presents with  . GI Bleeding       Brief Narrative   61 y.o. male with medical history significant of remote GI bleed from a gastric ulcer; HTN; and arthritis presenting With coffee-ground emesis, endoscopy 10/05/2016 significant for gastric ulcer.   Subjective:    Stephen Sullivan today has, No headache, No chest pain, No abdominal pain - No Nausea, Denies any further coffee-ground emesis since admission. As well no bowel movement since admission  Assessment  & Plan :    Principal Problem:   GI bleed Active Problems:   Polysubstance abuse   Tobacco dependence  Upper GI bleed - Patient presents with coffee-ground emesis, hemoglobin dropped 10-11 range from 13-14 baseline. - EGD significant for esophagitis, and nonbleeding gastric ulcer with visible vessel injected and treated with heater probe - Follow on H pylori panel,  - avoid NSAIDs. - Continue with IV ppi for 72 hours - On clear liquid diet - Low with GI in 12 weeks  Polysubstance abuse - Drug screen positive for THC and cocaine, he was counseled  Tobacco abuse - Counseled, continue with Decadron    Code Status : Full  Family Communication  : None at bedside  Disposition Plan  : Home when table  Consults  :  GI Dr Gala Romney  Procedures  : EGD 3/1  DVT Prophylaxis  : SCDs   Lab Results  Component Value Date   PLT 198 10/05/2016    Antibiotics  :    Anti-infectives    None        Objective:   Vitals:    10/05/16 1105 10/05/16 1110 10/05/16 1115 10/05/16 1154  BP: 120/75 104/70  136/83  Pulse:    69  Resp: 15 20 (!) 29 (!) 23  Temp:    97.6 F (36.4 C)  TempSrc:      SpO2: 99% 98% 98% 100%  Weight:      Height:        Wt Readings from Last 3 Encounters:  10/05/16 72.6 kg (160 lb)  08/18/16 70.3 kg (155 lb)  08/17/16 70.3 kg (155 lb)     Intake/Output Summary (Last 24 hours) at 10/05/16 1158 Last data filed at 10/05/16 1152  Gross per 24 hour  Intake          1318.75 ml  Output              400 ml  Net           918.75 ml     Physical Exam  (seen this am before EGD) Awake Alert, Oriented X 3, No new F.N deficits, Normal affect Supple Neck,No JVD,  Symmetrical Chest wall movement, Good air movement bilaterally, CTAB RRR,No Gallops,Rubs or new Murmurs, No Parasternal Heave +ve B.Sounds, Abd Soft, No tenderness,  No rebound - guarding or rigidity. No Cyanosis, Clubbing or edema, No new Rash or bruise      Data Review:    CBC  Recent Sullivan Lab 10/04/16 1518 10/05/16 0345 10/05/16 1012  WBC 18.0* 7.5 7.4  HGB 13.6 10.6* 11.4*  HCT 38.0* 30.4* 32.1*  PLT 239 167 198  MCV 89.0 89.9 89.9  MCH 31.9 31.4 31.9  MCHC 35.8 34.9 35.5  RDW 12.5 12.6 12.5    Chemistries   Recent Sullivan Lab 10/04/16 1518 10/05/16 0345  NA 136 136  K 3.9 3.6  CL 103 106  CO2 24 21*  GLUCOSE 105* 90  BUN 47* 32*  CREATININE 1.20 1.04  CALCIUM 9.5 8.4*  AST 17  --   ALT 11*  --   ALKPHOS 50  --   BILITOT 0.8  --    ------------------------------------------------------------------------------------------------------------------ No results for input(s): CHOL, HDL, LDLCALC, TRIG, CHOLHDL, LDLDIRECT in the last 72 hours.  No results found for: HGBA1C ------------------------------------------------------------------------------------------------------------------ No results for input(s): TSH, T4TOTAL, T3FREE, THYROIDAB in the last 72 hours.  Invalid input(s):  FREET3 ------------------------------------------------------------------------------------------------------------------ No results for input(s): VITAMINB12, FOLATE, FERRITIN, TIBC, IRON, RETICCTPCT in the last 72 hours.  Coagulation profile  Recent Sullivan Lab 10/04/16 1521  INR 1.04    No results for input(s): DDIMER in the last 72 hours.  Cardiac Enzymes No results for input(s): CKMB, TROPONINI, MYOGLOBIN in the last 168 hours.  Invalid input(s): CK ------------------------------------------------------------------------------------------------------------------ No results found for: BNP  Inpatient Medications  Scheduled Meds: . [MAR Hold] amLODipine  10 mg Oral Daily  . pantoprazole (PROTONIX) IVPB  80 mg Intravenous Once   Continuous Infusions: . sodium chloride    . lactated ringers 125 mL/hr at 10/05/16 0657  . lactated ringers    . pantoprozole (PROTONIX) infusion     PRN Meds:.[MAR Hold] acetaminophen **OR** [MAR Hold] acetaminophen, [MAR Hold] nicotine, [MAR Hold] ondansetron **OR** [MAR Hold] ondansetron (ZOFRAN) IV, [MAR Hold] oxyCODONE-acetaminophen, [MAR Hold] tiZANidine  Micro Results No results found for this or any previous visit (from the past 240 hour(s)).  Radiology Reports No results found.   Stephen Sullivan, Stephen Sullivan M.D on 10/05/2016 at 11:58 AM  Between 7am to 7pm - Pager - [phone]  After 7pm go to [email or web address] - password [facility]  [facility] -  Office  [phone]

## 2016-10-05 NOTE — Consult Note (Signed)
Referring Provider: Albertine Patricia, MD Primary Care Physician:  Celedonio Savage, MD Primary Gastroenterologist:  Garfield Cornea, MD  Reason for Consultation:  [name] bleeding  HPI: Stephen Sullivan is a 61 y.o. male with history of recent right inguinal hernia repair, hypertension, suspected ulcer in his 6s, who presented via EMS with several day history of abdominal pain associated with vomiting and diarrhea. Patient described black/bloody emesis and melena. Vomited over 30 times before presenting to the ER. Too weak to walk. History of marijuana off/on last time about 4-5 days ago. Occasional cocaine use. UDS + for cocaine and tetrhydrocannabinol. History of heavy alcohol use but none in about 10 years. No prior colonoscopy.  In the ED he had black stool on exam. Patient started on IV Protonix 40 mg every 12 hours. His vital signs been stable overnight with exception of slight tachycardia in the low 100 range. Blood pressure stable.  Patient denies asa and OTC NSAIDs. It is unclear if he was on naprosyn recent after his hernia repair, no longer on his med list.   Patient complains of frequent heartburn. Takes multiple over-the-counter agents. No dysphagia. No constipation or diarrhea. No unintentional weight loss.   Prior to Admission medications   Medication Sig Start Date End Date Taking? Authorizing Provider  amLODipine (NORVASC) 10 MG tablet Take 10 mg by mouth daily. 07/12/16  Yes Historical Provider, MD  oxyCODONE-acetaminophen (PERCOCET) 7.5-325 MG tablet Take 1 tablet by mouth every 4 (four) hours as needed for severe pain.   Yes Historical Provider, MD  tiZANidine (ZANAFLEX) 4 MG tablet Take 4 mg by mouth 4 (four) times daily as needed. 09/25/16  Yes Historical Provider, MD    Current Facility-Administered Medications  Medication Dose Route Frequency Provider Last Rate Last Dose  . acetaminophen (TYLENOL) tablet 650 mg  650 mg Oral Q6H PRN Karmen Bongo, MD       Or  . acetaminophen  (TYLENOL) suppository 650 mg  650 mg Rectal Q6H PRN Karmen Bongo, MD      . amLODipine (NORVASC) tablet 10 mg  10 mg Oral Daily Karmen Bongo, MD      . lactated ringers infusion   Intravenous Continuous Karmen Bongo, MD 125 mL/hr at 10/05/16 [phone]    . nicotine (NICODERM CQ - dosed in mg/24 hours) patch 14 mg  14 mg Transdermal Daily PRN Karmen Bongo, MD   14 mg at 10/05/16 0732  . ondansetron (ZOFRAN) tablet 4 mg  4 mg Oral Q6H PRN Karmen Bongo, MD       Or  . ondansetron [facility]) injection 4 mg  4 mg Intravenous Q6H PRN Karmen Bongo, MD      . oxyCODONE-acetaminophen (PERCOCET) 7.5-325 MG per tablet 1 tablet  1 tablet Oral Q4H PRN Karmen Bongo, MD      . pantoprazole (PROTONIX) injection 40 mg  40 mg Intravenous Q12H Karmen Bongo, MD      . tiZANidine (ZANAFLEX) tablet 4 mg  4 mg Oral Q6H PRN Karmen Bongo, MD        Allergies as of 10/04/2016  . (No Known Allergies)    Past Medical History:  Diagnosis Date  . Arthritis   . Back pain   . Cataracts, bilateral    immature  . GERD (gastroesophageal reflux disease)    using Baking Soda and Water per pt  . Headache(784.0)    related to neck issues  . History of bronchitis   . History of gastric ulcer 20+yrs ago  .  Hypertension    takes Benazepril some days  . Joint pain   . Muscle spasm    takes Flexeril daily  . Shortness of breath    with exertion  . Weakness    and numbness in left fingers    Past Surgical History:  Procedure Laterality Date  . ANTERIOR CERVICAL DECOMP/DISCECTOMY FUSION N/A 12/18/2013   Procedure: ANTERIOR CERVICAL DECOMPRESSION/DISCECTOMY FUSION CERVICAL THREE-FOUR, FOUR-FIVE, FIVE-SIX W/ BONEGRAFT;  Surgeon: Hosie Spangle, MD;  Location: [place] [facility];  Service: Neurosurgery;  Laterality: N/A;  ANTERIOR CERVICAL DECOMPRESSION/DISCECTOMY FUSION CERVICAL THREE-FOUR, FOUR-FIVE, FIVE-SIX W/ BONEGRAFT  . arm surgery Left   . INGUINAL HERNIA REPAIR Right 08/18/2016   Procedure: RIGHT INGUINAL  HERNORRHAPHY WITH MESH;  Surgeon: Aviva Signs, MD;  Location: [facility];  Service: General;  Laterality: Right;  . JOINT REPLACEMENT Right    hip   . SHOULDER SURGERY Right    x 3     Family History  Problem Relation Age of Onset  . Prostate cancer Father   . Prostate cancer Brother     Social History   Social History  . Marital status: Single    Spouse name: N/A  . Number of children: N/A  . Years of education: N/A   Occupational History  . disabled    Social History Main Topics  . Smoking status: Current Every Day Smoker    Packs/day: 0.50    Years: 47.00    Types: Cigarettes  . Smokeless tobacco: Never Used  . Alcohol use No  . Drug use: Yes    Types: Marijuana, Cocaine  . Sexual activity: Not Currently   Other Topics Concern  . Not on file   Social History Narrative  . No narrative on file     ROS:  General: Negative for anorexia, weight loss, fever, chills,See history of present illness  Eyes: Negative for vision changes.  ENT: Negative for hoarseness, difficulty swallowing , nasal congestion. CV: Negative for chest pain, angina, palpitations, dyspnea on exertion, peripheral edema.  Respiratory: Negative for dyspnea at rest, dyspnea on exertion, cough, sputum, wheezing.  GI: See history of present illness. GU:  Negative for dysuria, hematuria, urinary incontinence, urinary frequency, nocturnal urination.  MS: Negative for joint pain, low back pain.  Derm: Negative for rash or itching.  Neuro: Negative for weakness, abnormal sensation, seizure, frequent headaches, memory loss, confusion.  Psych: Negative for anxiety, depression, suicidal ideation, hallucinations.  Endo: Negative for unusual weight change.  Heme: Negative for bruising or bleeding. Allergy: Negative for rash or hives.       Physical Examination: Vital signs in last 24 hours: Temp:  [97.7 F (36.5 C)-98.9 F (37.2 C)] 98.6 F (37 C) (03/01 0651) Pulse Rate:  [93-110] 100 (03/01  0651) Resp:  [18-25] 20 (03/01 0651) BP: (118-148)/(50-100) 118/63 (03/01 0651) SpO2:  [97 %-100 %] 98 % (03/01 0651) Weight:  [160 lb (72.6 kg)] 160 lb (72.6 kg) (02/28 1435) Last BM Date: 10/04/16  General: Well-nourished, well-developed in no acute distress. Pale appearing Head: Normocephalic, atraumatic.   Eyes: Conjunctiva pale, no icterus. Mouth: Oropharyngeal mucosa moist and pink , no lesions erythema or exudate. Neck: Supple without thyromegaly, masses, or lymphadenopathy.  Lungs: Clear to auscultation bilaterally.  Heart: Regular rate and rhythm, no murmurs rubs or gallops.  Abdomen: Bowel sounds are normal, mild epigastric tenderness, nondistended, no hepatosplenomegaly or masses, no abdominal bruits or    hernia , no rebound or guarding.   Rectal: Done in the  ED, black stool. Heme positive. Extremities: No lower extremity edema, clubbing, deformity.  Neuro: Alert and oriented x 4 , grossly normal neurologically.  Skin: Warm and dry, no rash or jaundice.   Psych: Alert and cooperative, normal mood and affect.        Intake/Output from previous day: 02/28 0701 - 03/01 0700 In: 618.8 [I.V.:618.8] Out: 400 [Urine:400] Intake/Output this shift: No intake/output data recorded.  Lab Results: CBC  Recent Labs  10/04/16 1518 10/05/16 0345  WBC 18.0* 7.5  HGB 13.6 10.6*  HCT 38.0* 30.4*  MCV 89.0 89.9  PLT 239 167   BMET  Recent Labs  10/04/16 1518 10/05/16 0345  NA 136 136  K 3.9 3.6  CL 103 106  CO2 24 21*  GLUCOSE 105* 90  BUN 47* 32*  CREATININE 1.20 1.04  CALCIUM 9.5 8.4*   LFT  Recent Labs  10/04/16 1518  BILITOT 0.8  ALKPHOS 50  AST 17  ALT 11*  PROT 6.5  ALBUMIN 3.7    Lipase  Recent Labs  10/04/16 1521  LIPASE 10*    PT/INR  Recent Labs  10/04/16 1521  LABPROT 13.7  INR 1.04      Imaging Studies: No results found.Minnie.Brome week]   Impression: 61 year old gentleman residing with 3 day history of acute onset epigastric pain  associated with vomiting, coffee-ground emesis initially followed by frank blood. Subsequently developed melena. Became very weak and brought to the ER by EMS. He was found to have black heme positive stool on exam. Initial hemoglobin 13.6 down to 10.6 today. Initially had white blood cell count 18,000 but this is normal today. His INR is normal. Platelet count normal. Prior history of heavy alcohol use but none in 10 years. Based on labs no indication of chronic liver disease. Upper GI bleed, question etiology likely peptic ulcer disease. Less likely esophageal variceal bleed. Unclear whether he's been on NSAIDs recently. Has remained hemodynamically stable.  Plan: 1. Patient needs EGD today with deep sedation.  I have discussed the risks, alternatives, benefits with regards to but not limited to the risk of reaction to medication, bleeding, infection, perforation and the patient is agreeable to proceed.  2. Continue PPI.  3. T+C 2 units prbcs.   We would like to thank you for the opportunity to participate in the care of Stephen Sullivan.  Laureen Ochs. Bernarda Caffey [facility] [phone] 3/1/20189:00 AM     LOS: 1 day

## 2016-10-05 NOTE — Op Note (Signed)
[facility] Patient Name: Stephen Sullivan Procedure Date: 10/05/2016 11:22 AM MRN: [id number] Date of Birth: 08/24/1955 Attending MD: Norvel Richards , MD CSN: [id number] Age: 61 Admit Type: Inpatient Procedure:                Upper GI endoscopy with bleeding control therapy Indications:              Hematemesis Providers:                Norvel Richards, MD, Lurline Del, RN, Purcell Nails.                            [place], [name] Referring MD:              Medicines:                Propofol per Anesthesia Complications:            No immediate complications. Estimated Blood Loss:     Estimated blood loss: none. Procedure:                Pre-Anesthesia Assessment:                           - Prior to the procedure, a History and Physical                            was performed, and patient medications and                            allergies were reviewed. The patient's tolerance of                            previous anesthesia was also reviewed. The risks                            and benefits of the procedure and the sedation                            options and risks were discussed with the patient.                            All questions were answered, and informed consent                            was obtained. Prior Anticoagulants: The patient has                            taken no previous anticoagulant or antiplatelet                            agents. ASA Grade Assessment: III - A patient with                            severe systemic disease. After reviewing the risks  and benefits, the patient was deemed in                            satisfactory condition to undergo the procedure.                           After obtaining informed consent, the endoscope was                            passed under direct vision. Throughout the                            procedure, the patient's blood pressure, pulse, and     oxygen saturations were monitored continuously. The                            [id number]) scope was introduced through the                            mouth, and advanced to the second part of duodenum.                            The upper GI endoscopy was accomplished without                            difficulty. The patient tolerated the procedure                            well. Scope In: 11:26:35 AM Scope Out: 11:36:01 AM Total Procedure Duration: 0 hours 9 minutes 26 seconds  Findings:      LA Grade A (one or more mucosal breaks less than 5 mm, not extending       between tops of 2 mucosal folds) esophagitis was found 35 to 36 cm from       the incisors. no Barrett's.no varices.      A small hiatal hernia was present.      One non-bleeding cratered gastric ulcer with a visible vessel was found       in the gastric antrum. The lesion was 12 mm in largest dimension. Area       was successfully injected with 3 mL of a 1:10,000 solution of       epinephrine for drug delivery. Coagulation for hemostasis using heater       probe was successful. surrounding gastric erosions. Patent polyp lower.       Multiple bulbar and second portion duodenal erosions. Impression:               - LA Grade A esophagitis.                           - Small hiatal hernia.                           - Non-bleeding gastric ulcer with a visible vessel.  Injected. Treated with a heater probe.                           - No specimens collected. Moderate Sedation:      Moderate (conscious) sedation was personally administered by an       anesthesia professional. The following parameters were monitored: oxygen       saturation, heart rate, blood pressure, respiratory rate, EKG, adequacy       of pulmonary ventilation, and response to care. Total physician       intraservice time was 14 minutes. Recommendation:           - Return patient to hospital ward for ongoing care.                            - Clear liquid diet.                           - Continue present medications. IV PPI infusion -72                            hours. Check H. pylori serologies. Avoid NSAIDs in                            the future                           - Repeat upper endoscopy in 3 weeks for                            surveillance.                           - Return to GI clinic in 12 weeks. Procedure Code(s):        --- Professional ---                           [phone], Esophagogastroduodenoscopy, flexible,                            transoral; with control of bleeding, any method Diagnosis Code(s):        --- Professional ---                           K20.9, Esophagitis, unspecified                           K44.9, Diaphragmatic hernia without obstruction or                            gangrene                           K25.4, Chronic or unspecified gastric ulcer with                            hemorrhage  K92.0, Hematemesis CPT copyright 2016 American Medical Association. All rights reserved. The codes documented in this report are preliminary and upon coder review may  be revised to meet current compliance requirements. Cristopher Estimable. Asianna Brundage, MD Norvel Richards, MD 10/05/2016 11:51:20 AM This report has been signed electronically. Number of Addenda: 0

## 2016-10-05 NOTE — Anesthesia Procedure Notes (Signed)
Procedure Name: MAC Date/Time: 10/05/2016 11:16 AM Performed by: Andree Elk, AMY A Pre-anesthesia Checklist: Patient identified, Emergency Drugs available, Suction available, Patient being monitored and Timeout performed Oxygen Delivery Method: Simple face mask

## 2016-10-05 NOTE — Clinical Social Work Note (Signed)
CSW attempted to see pt due to substance abuse referral. Pt having EGD. Will attempt again later.  Benay Pike, [place]

## 2016-10-05 NOTE — Anesthesia Preprocedure Evaluation (Signed)
Anesthesia Evaluation  Patient identified by MRN, date of birth, ID band Patient awake    Reviewed: Allergy & Precautions, NPO status , Patient's Chart, lab work & pertinent test results  Airway Mallampati: III  TM Distance: >3 FB     Dental  (+) Teeth Intact, Dental Advisory Given   Pulmonary shortness of breath, Current Smoker,    breath sounds clear to auscultation       Cardiovascular hypertension, Pt. on medications  Rhythm:Regular Rate:Normal     Neuro/Psych  Headaches, PSYCHIATRIC DISORDERS    GI/Hepatic PUD, GERD  ,  Endo/Other    Renal/GU      Musculoskeletal   Abdominal   Peds  Hematology   Anesthesia Other Findings Polysubstance abuse - pos cocaine and THC screen. Will proceed in view of acute UGI bleeding.  Reproductive/Obstetrics                             Anesthesia Physical Anesthesia Plan  ASA: III  Anesthesia Plan: MAC   Post-op Pain Management:    Induction: Intravenous  Airway Management Planned: Simple Face Mask  Additional Equipment:   Intra-op Plan:   Post-operative Plan:   Informed Consent: I have reviewed the patients History and Physical, chart, labs and discussed the procedure including the risks, benefits and alternatives for the proposed anesthesia with the patient or authorized representative who has indicated his/her understanding and acceptance.     Plan Discussed with:   Anesthesia Plan Comments:         Anesthesia Quick Evaluation

## 2016-10-05 NOTE — Anesthesia Postprocedure Evaluation (Signed)
Anesthesia Post Note  Patient: Stephen Sullivan  Procedure(s) Performed: Procedure(s) (LRB): ESOPHAGOGASTRODUODENOSCOPY (EGD) WITH PROPOFOL (N/A)  Patient location during evaluation: PACU Anesthesia Type: MAC Level of consciousness: awake and alert and oriented Pain management: pain level controlled Vital Signs Assessment: post-procedure vital signs reviewed and stable Respiratory status: spontaneous breathing Cardiovascular status: stable Postop Assessment: no signs of nausea or vomiting Anesthetic complications: no     Last Vitals:  Vitals:   10/05/16 1110 10/05/16 1115  BP: 104/70   Pulse:    Resp: 20 (!) 29  Temp:      Last Pain:  Vitals:   10/05/16 1031  TempSrc:   PainSc: 0-No pain                 Jamillah Camilo A

## 2016-10-05 NOTE — Transfer of Care (Signed)
Immediate Anesthesia Transfer of Care Note  Patient: Stephen Sullivan  Procedure(s) Performed: Procedure(s): ESOPHAGOGASTRODUODENOSCOPY (EGD) WITH PROPOFOL (N/A)  Patient Location: PACU  Anesthesia Type:MAC  Level of Consciousness: awake, alert , oriented and patient cooperative  Airway & Oxygen Therapy: Patient Spontanous Breathing and Patient connected to face mask oxygen  Post-op Assessment: Report given to RN and Post -op Vital signs reviewed and stable  Post vital signs: Reviewed and stable  Last Vitals:  Vitals:   10/05/16 1110 10/05/16 1115  BP: 104/70   Pulse:    Resp: 20 (!) 29  Temp:      Last Pain:  Vitals:   10/05/16 1031  TempSrc:   PainSc: 0-No pain         Complications: No apparent anesthesia complications

## 2016-10-05 NOTE — Care Management Note (Signed)
Case Management Note  Patient Details  Name: Stephen Sullivan MRN: [id number] Date of Birth: 1955-09-25  Subjective/Objective:                  Pt admitted with GIB. Chart reviewed for CM needs. Pt is from home and ind with ADL's. He has PCP, transportation to appointments and no difficulty affording or managing medications. He admits to substance abuse, CSW has been referred. Pt plans to return home with self care.   Action/Plan: No CM needs anticipated.   Expected Discharge Date:     10/07/2016             Expected Discharge Plan:  Home/Self Care  In-House Referral:  Clinical Social Work  Discharge planning Services  CM Consult  Post Acute Care Choice:  NA Choice offered to:  NA  Status of Service:  Completed, signed off  Sherald Barge, RN 10/05/2016, 1:54 PM

## 2016-10-06 DIAGNOSIS — K922 Gastrointestinal hemorrhage, unspecified: Secondary | ICD-10-CM

## 2016-10-06 DIAGNOSIS — K253 Acute gastric ulcer without hemorrhage or perforation: Secondary | ICD-10-CM

## 2016-10-06 DIAGNOSIS — K209 Esophagitis, unspecified without bleeding: Secondary | ICD-10-CM

## 2016-10-06 LAB — CBC
HEMATOCRIT: 27.7 % — AB (ref 39.0–52.0)
HEMOGLOBIN: 9.8 g/dL — AB (ref 13.0–17.0)
MCH: 32 pg (ref 26.0–34.0)
MCHC: 35.4 g/dL (ref 30.0–36.0)
MCV: 90.5 fL (ref 78.0–100.0)
Platelets: 153 10*3/uL (ref 150–400)
RBC: 3.06 MIL/uL — AB (ref 4.22–5.81)
RDW: 12.6 % (ref 11.5–15.5)
WBC: 6.3 10*3/uL (ref 4.0–10.5)

## 2016-10-06 LAB — BASIC METABOLIC PANEL
ANION GAP: 6 (ref 5–15)
BUN: 15 mg/dL (ref 6–20)
CALCIUM: 8.5 mg/dL — AB (ref 8.9–10.3)
CO2: 25 mmol/L (ref 22–32)
Chloride: 104 mmol/L (ref 101–111)
Creatinine, Ser: 0.94 mg/dL (ref 0.61–1.24)
GFR calc non Af Amer: >60 mL/min (ref >60)
GLUCOSE: 85 mg/dL (ref 65–99)
POTASSIUM: 3.6 mmol/L (ref 3.5–5.1)
Sodium: 135 mmol/L (ref 135–145)

## 2016-10-06 LAB — H. PYLORI ANTIBODY, IGG: H PYLORI IGG: 6.84 {index_val} — AB (ref 0.00–0.79)

## 2016-10-06 MED ORDER — SACCHAROMYCES BOULARDII 250 MG PO CAPS
250.0000 mg | ORAL_CAPSULE | Freq: Two times a day (BID) | ORAL | Status: DC
  Administered 2016-10-06 – 2016-10-08 (×5): 250 mg via ORAL
  Filled 2016-10-06 (×5): qty 1

## 2016-10-06 MED ORDER — CLARITHROMYCIN 500 MG PO TABS
500.0000 mg | ORAL_TABLET | Freq: Two times a day (BID) | ORAL | Status: DC
  Filled 2016-10-06 (×3): qty 1

## 2016-10-06 MED ORDER — AMOXICILLIN 250 MG PO CAPS
1000.0000 mg | ORAL_CAPSULE | Freq: Two times a day (BID) | ORAL | Status: DC
  Administered 2016-10-06 – 2016-10-08 (×5): 1000 mg via ORAL
  Filled 2016-10-06 (×5): qty 4

## 2016-10-06 MED ORDER — MUSCLE RUB 10-15 % EX CREA
TOPICAL_CREAM | CUTANEOUS | Status: DC | PRN
  Administered 2016-10-06: 22:00:00 via TOPICAL
  Filled 2016-10-06: qty 85

## 2016-10-06 MED ORDER — MUSCLE RUB 10-15 % EX CREA
TOPICAL_CREAM | CUTANEOUS | Status: AC
  Filled 2016-10-06: qty 85

## 2016-10-06 NOTE — Care Management Important Message (Signed)
Important Message  Patient Details  Name: Stephen Sullivan MRN: [id number] Date of Birth: [birth year]   Medicare Important Message Given:  Yes    Sherald Barge, RN 10/06/2016, 9:34 AM

## 2016-10-06 NOTE — Clinical Social Work Note (Signed)
Clinical Social Work Assessment  Patient Details  Name: Stephen Sullivan MRN: [id number] Date of Birth: 08/19/1955  Date of referral:  10/06/16               Reason for consult:  Substance Use/ETOH Abuse                Permission sought to share information with:    Permission granted to share information::     Name::        Agency::     Relationship::     Contact Information:     Housing/Transportation Living arrangements for the past 2 months:  Single Family Home Source of Information:  Patient Patient Interpreter Needed:  None Criminal Activity/Legal Involvement Pertinent to Current Situation/Hospitalization:  No - Comment as needed Significant Relationships:  Other Family Members Lives with:  Self Do you feel safe going back to the place where you live?  Yes Need for family participation in patient care:  No (Coment)  Care giving concerns:  None reported. Pt indicates he is independent.    Social Worker assessment / plan:  CSW met with pt at bedside following referral for substance use. Pt alert and oriented and reports he lives alone. He lives on a lot of land with other family who he describes as good support. He has been on disability for about 20 years. Pt plans to return home at d/c. CSW discussed substance use with pt. He admits to heavy drinking in the past, but stopped on his own 10 years ago. He states he smokes marijuana "every now and then" when he can afford it. Pt admits to using cocaine in the past, but states this is rare. CSW shared that UDS was positive for [facility] and cocaine. He indicates he was aware of this. CSW discussed outpatient treatment and pt said he was not interested in this and does not feel his current substance use is a problem for him.   Employment status:  Disabled (Comment on whether or not currently receiving Disability) Insurance information:  Managed Medicare PT Recommendations:  Not assessed at this time Information / Referral to community  resources:  Outpatient Substance Abuse Treatment Options  Patient/Family's Response to care:  Pt declines any outpatient treatment resources.   Patient/Family's Understanding of and Emotional Response to Diagnosis, Current Treatment, and Prognosis:  Pt appears to have poor insight into drug use and medical issues. He does not feel that substance use is a problem for him. CSW signing off.   Emotional Assessment Appearance:  Appears stated age Attitude/Demeanor/Rapport:  Other (Cooperative) Affect (typically observed):  Appropriate Orientation:  Oriented to Self, Oriented to Place, Oriented to  Time, Oriented to Situation Alcohol / Substance use:  Not Applicable Psych involvement (Current and /or in the community):  No (Comment)  Discharge Needs  Concerns to be addressed:  Substance Abuse Concerns Readmission within the last 30 days:  No Current discharge risk:  Substance Abuse Barriers to Discharge:  No Barriers Identified   Salome Arnt, [place] 10/06/2016, 9:39 AM [phone]

## 2016-10-06 NOTE — Progress Notes (Addendum)
PROGRESS NOTE                                                                                                                                                                                                             Patient Demographics:    Stephen Sullivan, is a 61 y.o. male, DOB - 02-25-1956, SW:[id number]  Admit date - 10/04/2016   Admitting Physician Karmen Bongo, MD  Outpatient Primary MD for the patient is Celedonio Savage, MD  LOS - 2  Chief Complaint  Patient presents with  . GI Bleeding       Brief Narrative   61 y.o. male with medical history significant of remote GI bleed from a gastric ulcer; HTN; and arthritis presenting With coffee-ground emesis, endoscopy 10/05/2016 significant for gastric ulcer.   Subjective:    [name] today has, No headache, No chest pain, No abdominal pain - No Nausea, so far further coffee-ground emesis since admission. As well no bowel movement since admission  Assessment  & Plan :    Principal Problem:   GI bleed Active Problems:   Polysubstance abuse   Tobacco dependence   Gastrointestinal hemorrhage   Acute gastric ulcer   Acute esophagitis  Upper GI bleed - Patient presents with coffee-ground emesis, hemoglobin dropped 10-11 range from 13-14 baseline. - EGD significant for esophagitis, and nonbleeding gastric ulcer with visible vessel injected and treated with heater probe - H pylori panel  6.84, will start on clarithromycin and amoxicillin, already on Protonix drip - avoid NSAIDs. - Continue with IV ppi for 72 hours Finish on 3/4 - Advance to full liquid diet today - foloow with GI in 12 weeks  Polysubstance abuse - Drug screen positive for THC and cocaine, he was counseled  Tobacco abuse - Counseled, continue with neck with Dermabond    Code Status : Full  Family Communication  : None at bedside  Disposition Plan  : Home when finish IV ppi  Consults  :  GI Dr  Gala Romney  Procedures  : EGD 3/1  DVT Prophylaxis  : SCDs   Lab Results  Component Value Date   PLT 153 10/06/2016    Antibiotics  :    Anti-infectives    None        Objective:   Vitals:   10/05/16 1542 10/05/16 2203 10/06/16 0459  10/06/16 1003  BP: 111/69 118/77 105/60 104/64  Pulse: 69 67 63   Resp: (!) 21 (!) 21 16   Temp: 98.1 F (36.7 C) 98.1 F (36.7 C) 98.1 F (36.7 C)   TempSrc: Oral Oral Oral   SpO2: 100% 98% 99%   Weight:      Height:        Wt Readings from Last 3 Encounters:  10/05/16 72.6 kg (160 lb)  08/18/16 70.3 kg (155 lb)  08/17/16 70.3 kg (155 lb)     Intake/Output Summary (Last 24 hours) at 10/06/16 1315 Last data filed at 10/06/16 1100  Gross per 24 hour  Intake           4002.5 ml  Output             1500 ml  Net           2502.5 ml     Physical Exam  Awake Alert, Oriented X 3, No new F.N deficits, Normal affect Supple Neck,No JVD,  Symmetrical Chest wall movement, Good air movement bilaterally, CTAB RRR,No Gallops,Rubs or new Murmurs, No Parasternal Heave +ve B.Sounds, Abd Soft, No tenderness,  No rebound - guarding or rigidity. No Cyanosis, Clubbing or edema, No new Rash or bruise      Data Review:    CBC  Recent Labs Lab 10/04/16 1518 10/05/16 0345 10/05/16 1012 10/06/16 0451  WBC 18.0* 7.5 7.4 6.3  HGB 13.6 10.6* 11.4* 9.8*  HCT 38.0* 30.4* 32.1* 27.7*  PLT 239 167 198 153  MCV 89.0 89.9 89.9 90.5  MCH 31.9 31.4 31.9 32.0  MCHC 35.8 34.9 35.5 35.4  RDW 12.5 12.6 12.5 12.6    Chemistries   Recent Labs Lab 10/04/16 1518 10/05/16 0345 10/06/16 0451  NA 136 136 135  K 3.9 3.6 3.6  CL 103 106 104  CO2 24 21* 25  GLUCOSE 105* 90 85  BUN 47* 32* 15  CREATININE 1.20 1.04 0.94  CALCIUM 9.5 8.4* 8.5*  AST 17  --   --   ALT 11*  --   --   ALKPHOS 50  --   --   BILITOT 0.8  --   --    ------------------------------------------------------------------------------------------------------------------ No  results for input(s): CHOL, HDL, LDLCALC, TRIG, CHOLHDL, LDLDIRECT in the last 72 hours.  No results found for: HGBA1C ------------------------------------------------------------------------------------------------------------------ No results for input(s): TSH, T4TOTAL, T3FREE, THYROIDAB in the last 72 hours.  Invalid input(s): FREET3 ------------------------------------------------------------------------------------------------------------------ No results for input(s): VITAMINB12, FOLATE, FERRITIN, TIBC, IRON, RETICCTPCT in the last 72 hours.  Coagulation profile  Recent Labs Lab 10/04/16 1521  INR 1.04    No results for input(s): DDIMER in the last 72 hours.  Cardiac Enzymes No results for input(s): CKMB, TROPONINI, MYOGLOBIN in the last 168 hours.  Invalid input(s): CK ------------------------------------------------------------------------------------------------------------------ No results found for: BNP  Inpatient Medications  Scheduled Meds: . amLODipine  10 mg Oral Daily   Continuous Infusions: . lactated ringers 75 mL/hr at 10/06/16 0749  . pantoprozole (PROTONIX) infusion 8 mg/hr (10/06/16 1056)   PRN Meds:.acetaminophen **OR** acetaminophen, nicotine, ondansetron **OR** ondansetron (ZOFRAN) IV, oxyCODONE-acetaminophen, tiZANidine  Micro Results No results found for this or any previous visit (from the past 240 hour(s)).  Radiology Reports No results found.   [facility], DAWOOD M.D on 10/06/2016 at 1:15 PM  Between 7am to 7pm - Pager - [phone]  After 7pm go to [email or web address] - password [email or web address]  [facility] -  Office  [phone]

## 2016-10-06 NOTE — Progress Notes (Signed)
Subjective: Feels better today. No N/V, no bm in the past day. No abdominal pain. No other GI complaints. States his current situation really scared him and he knows he needs to stay away from NSAIDs.  Objective: Vital signs in last 24 hours: Temp:  [97.6 F (36.4 C)-98.1 F (36.7 C)] 98.1 F (36.7 C) (03/02 0459) Pulse Rate:  [63-69] 63 (03/02 0459) Resp:  [15-29] 16 (03/02 0459) BP: (103-136)/(60-83) 105/60 (03/02 0459) SpO2:  [98 %-100 %] 99 % (03/02 0459) Weight:  [160 lb (72.6 kg)] 160 lb (72.6 kg) (03/01 1031) Last BM Date: 10/04/16 General:   Alert and oriented, pleasant Head:  Normocephalic and atraumatic. Eyes:  No icterus, sclera clear. Conjuctiva pink.  Heart:  S1, S2 present, no murmurs noted.  Lungs: Clear to auscultation bilaterally, without wheezing, rales, or rhonchi.  Abdomen:  Bowel sounds present, soft, non-tender, non-distended. No HSM or hernias noted. No rebound or guarding. No masses appreciated  Msk:  Symmetrical without gross deformities. Extremities:  Without clubbing or edema. Neurologic:  Alert and  oriented x4;  grossly normal neurologically. Cervical Nodes:  No significant cervical adenopathy. Psych:  Alert and cooperative. Normal mood and affect.  Intake/Output from previous day: 03/01 0701 - 03/02 0700 In: 4342.5 [P.O.:240; I.V.:4102.5] Out: 300 [Urine:300] Intake/Output this shift: Total I/O In: 360 [P.O.:360] Out: -   Lab Results:  Recent Labs  10/05/16 0345 10/05/16 1012 10/06/16 0451  WBC 7.5 7.4 6.3  HGB 10.6* 11.4* 9.8*  HCT 30.4* 32.1* 27.7*  PLT 167 198 153   BMET  Recent Labs  10/04/16 1518 10/05/16 0345 10/06/16 0451  NA 136 136 135  K 3.9 3.6 3.6  CL 103 106 104  CO2 24 21* 25  GLUCOSE 105* 90 85  BUN 47* 32* 15  CREATININE 1.20 1.04 0.94  CALCIUM 9.5 8.4* 8.5*   LFT  Recent Labs  10/04/16 1518  PROT 6.5  ALBUMIN 3.7  AST 17  ALT 11*  ALKPHOS 50  BILITOT 0.8   PT/INR  Recent Labs   10/04/16 1521  LABPROT 13.7  INR 1.04   Hepatitis Panel No results for input(s): HEPBSAG, HCVAB, HEPAIGM, HEPBIGM in the last 72 hours.   Studies/Results: No results found.  Assessment: 61 year old gentleman residing with 3 day history of acute onset epigastric pain associated with vomiting, coffee-ground emesis initially followed by frank blood. Subsequently developed melena. Became very weak and brought to the ER by EMS. He was found to have black heme positive stool on exam. Initial hemoglobin 13.6 down to 10.6 today. Initially had white blood cell count 18,000 but this was normal yesterday. His INR is normal. Platelet count normal. Prior history of heavy alcohol use but none in 10 years. Based on labs no indication of chronic liver disease. Upper GI bleed, question etiology likely peptic ulcer disease. Less likely esophageal variceal bleed. Unclear whether he's been on NSAIDs recently. Has remained hemodynamically stable.  EGD yesterday found LA grade A esophagitis, small hiatal hernia, non-bleeding gastric ulcer with visible vessel injected with 3 mL 1:10,000 solution of epinephrine and heater probe treatment. Recommended clear liquid diet, continue medications, IV PPI infusion for 72 hours, repeat EGD for surveillance in 3 weeks, outpatient office GI followup in 12 weeks.  Seems clinically improved today. CBC with decline in hgb to 9.8 from 11.4 but no continued obvious GI bleed. H. Pylori IgG positive although this could be false positive if he's been infected before. Will need to discuss if treatment  is appropriate.   Plan: 1. Continue IV PPI for 72 hours total (finish 1330 on 3/4) 2. Will need bid PPI at discharge 3. Will schedule surveillance EGD with our office on propofol (message sent to our office) 4. Will schedule 12 week outpatient followup (message sent to our office) 5. Supportive measures 6. Monitor H./H closely for further decline 7. Monitor for any hematemesis, melena,  or other obvious GI bleed   Thank you for allowing [place] to participate in the care of [place], DNP, AGNP-C Adult & Gerontological Nurse Practitioner [facility]     LOS: 2 days    10/06/2016, 9:18 AM

## 2016-10-07 DIAGNOSIS — R768 Other specified abnormal immunological findings in serum: Secondary | ICD-10-CM

## 2016-10-07 DIAGNOSIS — K250 Acute gastric ulcer with hemorrhage: Secondary | ICD-10-CM

## 2016-10-07 LAB — BASIC METABOLIC PANEL
Anion gap: 6 (ref 5–15)
BUN: 8 mg/dL (ref 6–20)
CALCIUM: 8.8 mg/dL — AB (ref 8.9–10.3)
CHLORIDE: 104 mmol/L (ref 101–111)
CO2: 26 mmol/L (ref 22–32)
CREATININE: 1 mg/dL (ref 0.61–1.24)
GFR calc Af Amer: >60 mL/min (ref >60)
GFR calc non Af Amer: >60 mL/min (ref >60)
Glucose, Bld: 96 mg/dL (ref 65–99)
Potassium: 3.5 mmol/L (ref 3.5–5.1)
Sodium: 136 mmol/L (ref 135–145)

## 2016-10-07 LAB — CBC
HEMATOCRIT: 29.7 % — AB (ref 39.0–52.0)
HEMOGLOBIN: 10.5 g/dL — AB (ref 13.0–17.0)
MCH: 32.1 pg (ref 26.0–34.0)
MCHC: 35.4 g/dL (ref 30.0–36.0)
MCV: 90.8 fL (ref 78.0–100.0)
Platelets: 178 10*3/uL (ref 150–400)
RBC: 3.27 MIL/uL — ABNORMAL LOW (ref 4.22–5.81)
RDW: 12.6 % (ref 11.5–15.5)
WBC: 6.3 10*3/uL (ref 4.0–10.5)

## 2016-10-07 MED ORDER — POTASSIUM CHLORIDE CRYS ER 20 MEQ PO TBCR
40.0000 meq | EXTENDED_RELEASE_TABLET | Freq: Once | ORAL | Status: AC
Start: 2016-10-07 — End: 2016-10-07
  Administered 2016-10-07: 40 meq via ORAL
  Filled 2016-10-07: qty 2

## 2016-10-07 MED ORDER — CLARITHROMYCIN 500 MG PO TABS
500.0000 mg | ORAL_TABLET | Freq: Two times a day (BID) | ORAL | Status: DC
  Administered 2016-10-07 – 2016-10-08 (×3): 500 mg via ORAL
  Filled 2016-10-07 (×2): qty 1

## 2016-10-07 NOTE — Progress Notes (Signed)
PROGRESS NOTE                                                                                                                                                                                                             Patient Demographics:    Stephen Sullivan, is a 61 y.o. male, DOB - 1956-07-20, SW:[id number]  Admit date - 10/04/2016   Admitting Physician Karmen Bongo, MD  Outpatient Primary MD for the patient is Celedonio Savage, MD  LOS - 3  Chief Complaint  Patient presents with  . GI Bleeding       Brief Narrative   61 y.o. male with medical history significant of remote GI bleed from a gastric ulcer; HTN; and arthritis presenting With coffee-ground emesis, endoscopy 10/05/2016 significant for gastric ulcer.   Subjective:    Stephen Sullivan today has, No headache, No chest pain, No abdominal pain - No Nausea, so far further coffee-ground emesis since admission. As well no bowel movement since admission  Assessment  & Plan :    Principal Problem:   GI bleed Active Problems:   Polysubstance abuse   Tobacco dependence   Gastrointestinal hemorrhage   Acute gastric ulcer   Acute esophagitis  Upper GI bleed - Patient presents with coffee-ground emesis, hemoglobin dropped 10-11 range from 13-14 baseline. - EGD significant for esophagitis, and nonbleeding gastric ulcer with visible vessel injected and treated with heater probe - H pylori panel  6.84, Started on clarithromycin and amoxicillin, already on Protonix drip - avoid NSAIDs. - Continue with IV ppi for 72 hours Finish on 3/4 - Tolerating soft diet - follow with GI in 12 weeks  Polysubstance abuse - Drug screen positive for THC and cocaine, he was counseled  Tobacco abuse - Counseled, continue with nicoderm patch.    Code Status : Full  Family Communication  : None at bedside  Disposition Plan  : Home when finish IV ppi  Consults  :  GI Dr Gala Romney  Procedures  : EGD 3/1  DVT  Prophylaxis  : SCDs , patient encouraged to ambulate  Lab Results  Component Value Date   PLT 178 10/07/2016    Antibiotics  :    Anti-infectives    Start     Dose/Rate Route Frequency Ordered Stop   10/07/16 1030  clarithromycin (BIAXIN) tablet 500 mg  500 mg Oral Every 12 hours 10/07/16 1021     10/06/16 1400  amoxicillin (AMOXIL) capsule 1,000 mg     1,000 mg Oral Every 12 hours 10/06/16 1327     10/06/16 1400  clarithromycin (BIAXIN) tablet 500 mg  Status:  Discontinued     500 mg Oral Every 12 hours 10/06/16 1327 10/07/16 1021        Objective:   Vitals:   10/06/16 1323 10/06/16 2123 10/07/16 0634 10/07/16 1020  BP: 112/71 111/76 118/73 (!) 115/100  Pulse: 68 67 64   Resp: 20 20 20    Temp: 98.5 F (36.9 C) 97.6 F (36.4 C) 98.3 F (36.8 C)   TempSrc: Oral Oral Oral   SpO2: 100% 100% 100%   Weight:      Height:        Wt Readings from Last 3 Encounters:  10/05/16 72.6 kg (160 lb)  08/18/16 70.3 kg (155 lb)  08/17/16 70.3 kg (155 lb)     Intake/Output Summary (Last 24 hours) at 10/07/16 1046 Last data filed at 10/07/16 [id number]  Gross per 24 hour  Intake          2650.42 ml  Output             3475 ml  Net          -824.58 ml     Physical Exam  Awake Alert, Oriented X 3, No new F.N deficits, Normal affect Supple Neck,No JVD,  Symmetrical Chest wall movement, Good air movement bilaterally, CTAB RRR,No Gallops,Rubs or new Murmurs, No Parasternal Heave +ve B.Sounds, Abd Soft, No tenderness,  No rebound - guarding or rigidity. No Cyanosis, Clubbing or edema, No new Rash or bruise      Data Review:    CBC  Recent Sullivan Lab 10/04/16 1518 10/05/16 0345 10/05/16 1012 10/06/16 0451 10/07/16 0612  WBC 18.0* 7.5 7.4 6.3 6.3  HGB 13.6 10.6* 11.4* 9.8* 10.5*  HCT 38.0* 30.4* 32.1* 27.7* 29.7*  PLT 239 167 198 153 178  MCV 89.0 89.9 89.9 90.5 90.8  MCH 31.9 31.4 31.9 32.0 32.1  MCHC 35.8 34.9 35.5 35.4 35.4  RDW 12.5 12.6 12.5 12.6 12.6     Chemistries   Recent Sullivan Lab 10/04/16 1518 10/05/16 0345 10/06/16 0451 10/07/16 0612  NA 136 136 135 136  K 3.9 3.6 3.6 3.5  CL 103 106 104 104  CO2 24 21* 25 26  GLUCOSE 105* 90 85 96  BUN 47* 32* 15 8  CREATININE 1.20 1.04 0.94 1.00  CALCIUM 9.5 8.4* 8.5* 8.8*  AST 17  --   --   --   ALT 11*  --   --   --   ALKPHOS 50  --   --   --   BILITOT 0.8  --   --   --    ------------------------------------------------------------------------------------------------------------------ No results for input(s): CHOL, HDL, LDLCALC, TRIG, CHOLHDL, LDLDIRECT in the last 72 hours.  No results found for: HGBA1C ------------------------------------------------------------------------------------------------------------------ No results for input(s): TSH, T4TOTAL, T3FREE, THYROIDAB in the last 72 hours.  Invalid input(s): FREET3 ------------------------------------------------------------------------------------------------------------------ No results for input(s): VITAMINB12, FOLATE, FERRITIN, TIBC, IRON, RETICCTPCT in the last 72 hours.  Coagulation profile  Recent Sullivan Lab 10/04/16 1521  INR 1.04    No results for input(s): DDIMER in the last 72 hours.  Cardiac Enzymes No results for input(s): CKMB, TROPONINI, MYOGLOBIN in the last 168 hours.  Invalid input(s): CK ------------------------------------------------------------------------------------------------------------------ No results found for: BNP  Inpatient  Medications  Scheduled Meds: . amLODipine  10 mg Oral Daily  . amoxicillin  1,000 mg Oral Q12H  . clarithromycin  500 mg Oral Q12H  . potassium chloride  40 mEq Oral Once  . saccharomyces boulardii  250 mg Oral BID   Continuous Infusions: . lactated ringers 75 mL/hr at 10/07/16 1020  . pantoprozole (PROTONIX) infusion 8 mg/hr (10/07/16 0745)   PRN Meds:.acetaminophen **OR** acetaminophen, MUSCLE RUB, nicotine, ondansetron **OR** ondansetron (ZOFRAN)  IV, oxyCODONE-acetaminophen, tiZANidine  Micro Results No results found for this or any previous visit (from the past 240 hour(s)).  Radiology Reports No results found.   Stephen Sullivan, Stephen Sullivan M.D on 10/07/2016 at 10:46 AM  Between 7am to 7pm - Pager - [phone]  After 7pm go to [email or web address] - password [facility]  [facility] -  Office  [phone]

## 2016-10-07 NOTE — Progress Notes (Signed)
Patient ID: Stephen Sullivan, male   DOB: 1956-01-04, 61 y.o.   MRN: [id number]   Assessment/Plan: ADMITTED WITH UGIB FEB 28, S/P EGD W/ INTERVENTION. NO BRBPR OR MELENA. ON PROTONIX GTT. SCHEDULED STOP TIME MAR 4 _0 .  PLAN: 1. COMPLETE PROTONIX MAR 4. OK TO DC HOME IF BP/HB STABLE. NEEDS PROTONIX BID FOR 3 MOS. COMPLETE 14 DAYS BIAXIN/AMOX FOR POS H PYLORI SEROLOGY. 2. OPV FOLLOWED BY EGD W/ MAC IN 12 WEEKS.   Subjective: Since I last evaluated the patient HE DENIES BRBPR, MELENA, OR ABDOMINAL PAIN. TOLERATING POs.  Objective: Vital signs in last 24 hours: Vitals:   10/07/16 0634 10/07/16 1020  BP: 118/73 (!) 115/100  Pulse: 64   Resp: 20   Temp: 98.3 F (36.8 C)    General appearance: alert, cooperative and no distress Resp: clear to auscultation bilaterally Cardio: regular rate and rhythm GI: soft, non-tender; bowel sounds normal;   Lab Results:  JAN 28  MAR 1  MAR 3 Hb 13.6  10.6  10.5   Studies/Results: No results found.  Medications: I have reviewed the patient's current medications.   LOS: 5 days   Barney Drain 01/15/2014, 2:23 PM

## 2016-10-07 NOTE — Plan of Care (Signed)
Problem: Activity: Goal: Risk for activity intolerance will decrease Outcome: Progressing Up with assistance    

## 2016-10-07 NOTE — Plan of Care (Signed)
Problem: Education: Goal: Knowledge of [facility] Education information/materials will improve Outcome: Progressing Skin regimen implemented

## 2016-10-08 ENCOUNTER — Telehealth: Payer: Self-pay | Admitting: Gastroenterology

## 2016-10-08 LAB — CBC
HCT: 27.3 % — ABNORMAL LOW (ref 39.0–52.0)
Hemoglobin: 9.7 g/dL — ABNORMAL LOW (ref 13.0–17.0)
MCH: 32.3 pg (ref 26.0–34.0)
MCHC: 35.5 g/dL (ref 30.0–36.0)
MCV: 91 fL (ref 78.0–100.0)
PLATELETS: 162 10*3/uL (ref 150–400)
RBC: 3 MIL/uL — AB (ref 4.22–5.81)
RDW: 12.7 % (ref 11.5–15.5)
WBC: 4.6 10*3/uL (ref 4.0–10.5)

## 2016-10-08 MED ORDER — AMOXICILLIN 500 MG PO CAPS: 1000.0000 mg | ORAL_CAPSULE | Freq: Two times a day (BID) | ORAL | 0 refills | Status: AC

## 2016-10-08 MED ORDER — NICOTINE 14 MG/24HR TD PT24: 14.0000 mg | MEDICATED_PATCH | Freq: Every day | TRANSDERMAL | 0 refills | Status: DC

## 2016-10-08 MED ORDER — SACCHAROMYCES BOULARDII 250 MG PO CAPS: 250.0000 mg | ORAL_CAPSULE | Freq: Two times a day (BID) | ORAL | 0 refills | Status: DC

## 2016-10-08 MED ORDER — PANTOPRAZOLE SODIUM 40 MG PO TBEC: 40.0000 mg | DELAYED_RELEASE_TABLET | Freq: Two times a day (BID) | ORAL | 0 refills | Status: DC

## 2016-10-08 MED ORDER — CLARITHROMYCIN 500 MG PO TABS: 500.0000 mg | ORAL_TABLET | Freq: Two times a day (BID) | ORAL | 0 refills | Status: AC

## 2016-10-08 NOTE — Discharge Instructions (Signed)
Follow with Primary MD Celedonio Savage, MD in 7 days   Get CBC, CMP,  checked  by Primary MD next visit.    Activity: As tolerated with Full fall precautions use walker/cane & assistance as needed   Disposition Home    Diet: Regular Diet   On your next visit with your primary care physician please Get Medicines reviewed and adjusted.   Please request your Prim.MD to go over all Hospital Tests and Procedure/Radiological results at the follow up, please get all Hospital records sent to your Prim MD by signing hospital release before you go home.   If you experience worsening of your admission symptoms, develop shortness of breath, life threatening emergency, suicidal or homicidal thoughts you must seek medical attention immediately by calling 911 or calling your MD immediately  if symptoms less severe.  You Must read complete instructions/literature along with all the possible adverse reactions/side effects for all the Medicines you take and that have been prescribed to you. Take any new Medicines after you have completely understood and accpet all the possible adverse reactions/side effects.   Do not drive, operating heavy machinery, perform activities at heights, swimming or participation in water activities or provide baby sitting services if your were admitted for syncope or siezures until you have seen by Primary MD or a Neurologist and advised to do so again.  Do not drive when taking Pain medications.    Do not take more than prescribed Pain, Sleep and Anxiety Medications  Special Instructions: If you have smoked or chewed Tobacco  in the last 2 yrs please stop smoking, stop any regular Alcohol  and or any Recreational drug use.  Wear Seat belts while driving.   Please note  You were cared for by a hospitalist during your hospital stay. If you have any questions about your discharge medications or the care you received while you were in the hospital after you are discharged,  you can call the unit and asked to speak with the hospitalist on call if the hospitalist that took care of you is not available. Once you are discharged, your primary care physician will handle any further medical issues. Please note that NO REFILLS for any discharge medications will be authorized once you are discharged, as it is imperative that you return to your primary care physician (or establish a relationship with a primary care physician if you do not have one) for your aftercare needs so that they can reassess your need for medications and monitor your lab values.

## 2016-10-08 NOTE — Telephone Encounter (Signed)
OPV IN 3 MOS W/ RMR Dx: PUD/UGIB. NEEDS REPEAT EGD W/ MAC TO CONFIRM ULCER HAS HEALED.

## 2016-10-08 NOTE — Progress Notes (Signed)
Pt's IV catheter removed and intact. Pt's IV site clean dry and intact. Discharge instructions including medications and follow up appointments were reviewed and discussed with patient and his sister. All questions were answered and no further questions at this time. Pt and his sister verbalized understanding of discharge instructions including medications and follow up appointments. Pt in stable condition and in no acute distress at time of discharge. Pt escorted by nurse tech.  

## 2016-10-08 NOTE — Discharge Summary (Signed)
Stephen Sullivan, is a 61 y.o. male  DOB 1956-06-23  MRN [id number].  Admission date:  10/04/2016  Admitting Physician  Karmen Bongo, MD  Discharge Date:  10/08/2016   Primary MD  Celedonio Savage, MD  Recommendations for primary care physician for things to follow:  - Please check CBC, BMP during next visit - Patient to follow with GI as an outpatient, they will call with an appointment - Continue counseling about tobacco cessation and polysubstance abuse - Please avoid NSAIDs   Admission Diagnosis  Gastrointestinal hemorrhage, unspecified gastrointestinal hemorrhage type [K92.2]   Discharge Diagnosis  Gastrointestinal hemorrhage, unspecified gastrointestinal hemorrhage type [K92.2]    Principal Problem:   GI bleed Active Problems:   Polysubstance abuse   Tobacco dependence   Gastrointestinal hemorrhage   Acute gastric ulcer   Acute esophagitis      Past Medical History:  Diagnosis Date  . Arthritis   . Back pain   . Cataracts, bilateral    immature  . GERD (gastroesophageal reflux disease)    using Baking Soda and Water per pt  . Headache(784.0)    related to neck issues  . History of bronchitis   . History of gastric ulcer 20+yrs ago  . Hypertension    takes Benazepril some days  . Joint pain   . Muscle spasm    takes Flexeril daily  . Shortness of breath    with exertion  . Weakness    and numbness in left fingers    Past Surgical History:  Procedure Laterality Date  . ANTERIOR CERVICAL DECOMP/DISCECTOMY FUSION N/A 12/18/2013   Procedure: ANTERIOR CERVICAL DECOMPRESSION/DISCECTOMY FUSION CERVICAL THREE-FOUR, FOUR-FIVE, FIVE-SIX W/ BONEGRAFT;  Surgeon: Hosie Spangle, MD;  Location: [facility];  Service: Neurosurgery;  Laterality: N/A;  ANTERIOR CERVICAL DECOMPRESSION/DISCECTOMY FUSION CERVICAL THREE-FOUR, FOUR-FIVE, FIVE-SIX W/ BONEGRAFT  . arm surgery Left   . INGUINAL  HERNIA REPAIR Right 08/18/2016   Procedure: RIGHT INGUINAL HERNORRHAPHY WITH MESH;  Surgeon: Aviva Signs, MD;  Location: [facility];  Service: General;  Laterality: Right;  . JOINT REPLACEMENT Right    hip   . SHOULDER SURGERY Right    x 3        History of present illness and  Hospital Course:     Kindly see H&P for history of present illness and admission details, please review complete Labs, Consult reports and Test reports for all details in brief  HPI  from the history and physical done on the day of admission 10/04/2016 HPI: Stephen Sullivan is a 61 y.o. male with medical history significant of remote GI bleed from a gastric ulcer; HTN; and arthritis presenting because "I was dying, if I hadn't of came I wouldn't be here."  30 years ago he had a "baby ulcer".  This time, he reports "That thing was stopping me in my tracks, putting me down on my knees."  Severe pain started yesterday, vomiting black/bloody, diaphoresis.  Developed diarrhea, black/bloody.  Diarrhea so much "my butt's raw." Emesis maybe  30 episodes.  Last emesis just before coming to the ER, last diarrhea was this AM.  Currently tolerating ice chips without difficulty.  Too light-headed to walk when EMS arrived.  Started having abdominal pain about 3 days ago after he got upset from an argument with his ex-girlfriend who owes him a bunch of money.  Does not take ASA and NSAIDs (although appears to have been taking a prescription for Naproxen so perhaps he was unaware this is an NSAID), does not take any pain medication.  No ETOH, h/o heavy use, none in about 10 years.  +marijuana, last use 4-5 days ago.  Used cocaine last about the same time.   He reports that he has never had a colonoscopy.   ED Course: Black stool on exam.  Given IVF and Protonix 80 mg IV.  Called Dr. Laural Golden who will see and scope tomorrow.   Hospital Course  60 y.o.malewith medical history significant of remote GI bleed from a gastric ulcer; HTN; and  arthritis presenting With coffee-ground emesis, endoscopy 10/05/2016 significant for gastric ulcer.  Upper GI bleed - Patient presents with coffee-ground emesis, hemoglobin dropped 10-11 range from 13-14 baseline. - EGD significant for esophagitis, and nonbleeding gastric ulcer with visible vessel injected and treated with heater probe - H pylori panel  6.84, Started on clarithromycin and amoxicillin, already on Protonix drip, to finish total of 14 days of clarithromycin and amoxicillin, will be discharged on Protonix 40 twice a day. - avoid NSAIDs, patient was educated at length about that - Treated with total of 72 hours of IV PPI, discharged on Protonix twice a day, given prescription for 90 days. - Will need outpatient EGD in 12 weeks, to be arranged by GI. - Tolerating soft diet, hemoglobin been stable, no evidence of further GI bleed  Polysubstance abuse - Drug screen positive for THC and cocaine, he was counseled  Tobacco abuse - Counseled, continue with nicoderm patch.     Discharge Condition:  Stable   Follow UP  Follow-up Information    Celedonio Savage, MD Follow up in 1 week(s).   Specialty:  Family Medicine Contact information: [address] [place] [address] [phone]        Manus Rudd, MD Follow up.   Specialty:  Gastroenterology Why:  you Will be called with an appointment Contact information: [address] [place] [address] [phone]             Discharge Instructions  and  Discharge Medications    Discharge Instructions    Discharge instructions    Complete by:  As directed    Follow with Primary MD Celedonio Savage, MD in 7 days   Get CBC, CMP,  checked  by Primary MD next visit.    Activity: As tolerated with Full fall precautions use walker/cane & assistance as needed   Disposition Home    Diet: Regular Diet   On your next visit with your primary care physician please Get Medicines reviewed and  adjusted.   Please request your Prim.MD to go over all Hospital Tests and Procedure/Radiological results at the follow up, please get all Hospital records sent to your Prim MD by signing hospital release before you go home.   If you experience worsening of your admission symptoms, develop shortness of breath, life threatening emergency, suicidal or homicidal thoughts you must seek medical attention immediately by calling 911 or calling your MD immediately  if symptoms less severe.  You Must read complete  instructions/literature along with all the possible adverse reactions/side effects for all the Medicines you take and that have been prescribed to you. Take any new Medicines after you have completely understood and accpet all the possible adverse reactions/side effects.   Do not drive, operating heavy machinery, perform activities at heights, swimming or participation in water activities or provide baby sitting services if your were admitted for syncope or siezures until you have seen by Primary MD or a Neurologist and advised to do so again.  Do not drive when taking Pain medications.    Do not take more than prescribed Pain, Sleep and Anxiety Medications  Special Instructions: If you have smoked or chewed Tobacco  in the last 2 yrs please stop smoking, stop any regular Alcohol  and or any Recreational drug use.  Wear Seat belts while driving.   Please note  You were cared for by a hospitalist during your hospital stay. If you have any questions about your discharge medications or the care you received while you were in the hospital after you are discharged, you can call the unit and asked to speak with the hospitalist on call if the hospitalist that took care of you is not available. Once you are discharged, your primary care physician will handle any further medical issues. Please note that NO REFILLS for any discharge medications will be authorized once you are discharged, as it is  imperative that you return to your primary care physician (or establish a relationship with a primary care physician if you do not have one) for your aftercare needs so that they can reassess your need for medications and monitor your lab values.   Increase activity slowly    Complete by:  As directed      Allergies as of 10/08/2016   No Known Allergies     Medication List    TAKE these medications   amLODipine 10 MG tablet Commonly known as:  NORVASC Take 10 mg by mouth daily.   amoxicillin 500 MG capsule Commonly known as:  AMOXIL Take 2 capsules (1,000 mg total) by mouth 2 (two) times daily.   clarithromycin 500 MG tablet Commonly known as:  BIAXIN Take 1 tablet (500 mg total) by mouth every 12 (twelve) hours.   nicotine 14 mg/24hr patch Commonly known as:  NICODERM CQ - dosed in mg/24 hours Place 1 patch (14 mg total) onto the skin daily.   oxyCODONE-acetaminophen 7.5-325 MG tablet Commonly known as:  PERCOCET Take 1 tablet by mouth every 4 (four) hours as needed for severe pain.   pantoprazole 40 MG tablet Commonly known as:  PROTONIX Take 1 tablet (40 mg total) by mouth 2 (two) times daily.   saccharomyces boulardii 250 MG capsule Commonly known as:  FLORASTOR Take 1 capsule (250 mg total) by mouth 2 (two) times daily.   tiZANidine 4 MG tablet Commonly known as:  ZANAFLEX Take 4 mg by mouth 4 (four) times daily as needed.         Diet and Activity recommendation: See Discharge Instructions above   Consults obtained -  GI   Major procedures and Radiology Reports - PLEASE review detailed and final reports for all details, in brief -   EGD 3/1   No results found.  Micro Results     No results found for this or any previous visit (from the past 240 hour(s)).     Today   Subjective:   Stephen Sullivan today has no headache,no chest or  abdominal pain,no new weakness tingling or numbness, feels much better wants to go home today.   Objective:    Sullivan pressure 103/66, pulse 69, temperature 98.4 F (36.9 C), temperature source Oral, resp. rate 20, height 5\' 11"  (1.803 m), weight 72.6 kg (160 lb), SpO2 100 %.   Intake/Output Summary (Last 24 hours) at 10/08/16 0947 Last data filed at 10/08/16 0634  Gross per 24 hour  Intake              480 ml  Output             3850 ml  Net            -3370 ml    Exam Awake Alert, Oriented x 3, No new F.N deficits, Normal affect [place].AT,PERRAL Supple Neck,No JVD, No cervical lymphadenopathy appriciated.  Symmetrical Chest wall movement, Good air movement bilaterally, CTAB RRR,No Gallops,Rubs or new Murmurs, No Parasternal Heave +ve B.Sounds, Abd Soft, Non tender,No rebound -guarding or rigidity. No Cyanosis, Clubbing or edema, No new Rash or bruise  Data Review   CBC w Diff: Lab Results  Component Value Date   WBC 4.6 10/08/2016   HGB 9.7 (L) 10/08/2016   HCT 27.3 (L) 10/08/2016   PLT 162 10/08/2016   LYMPHOPCT 26 01/04/2016   MONOPCT 9 01/04/2016   EOSPCT 1 01/04/2016   BASOPCT 0 01/04/2016    CMP: Lab Results  Component Value Date   NA 136 10/07/2016   K 3.5 10/07/2016   CL 104 10/07/2016   CO2 26 10/07/2016   BUN 8 10/07/2016   CREATININE 1.00 10/07/2016   PROT 6.5 10/04/2016   ALBUMIN 3.7 10/04/2016   BILITOT 0.8 10/04/2016   ALKPHOS 50 10/04/2016   AST 17 10/04/2016   ALT 11 (L) 10/04/2016  .   Total Time in preparing paper work, data evaluation and todays exam - 35 minutes  Pharrah Rottman M.D on 10/08/2016 at 9:47 AM  [facility]   Office  [phone]

## 2016-10-08 NOTE — Plan of Care (Signed)
Problem: Activity: Goal: Risk for activity intolerance will decrease Outcome: Adequate for Discharge Patient ambulates up the hallway unassisted several times per shift

## 2016-10-09 ENCOUNTER — Telehealth: Payer: Self-pay

## 2016-10-09 ENCOUNTER — Encounter (HOSPITAL_COMMUNITY): Payer: Self-pay | Admitting: Internal Medicine

## 2016-10-09 ENCOUNTER — Encounter: Payer: Self-pay | Admitting: Internal Medicine

## 2016-10-09 ENCOUNTER — Other Ambulatory Visit: Payer: Self-pay

## 2016-10-09 DIAGNOSIS — K253 Acute gastric ulcer without hemorrhage or perforation: Secondary | ICD-10-CM

## 2016-10-09 NOTE — Telephone Encounter (Signed)
APPT MADE AND LETTER SENT  °

## 2016-10-09 NOTE — Telephone Encounter (Signed)
-----   Message from Carlis Stable, NP sent at 10/06/2016 12:33 PM EST ----- Will need EGD for surveillance of gastric ulcer with RMR on propofol in 3 weeks. Thanks!

## 2016-10-09 NOTE — Telephone Encounter (Signed)
Called pt. EGD w/Propofol with RMR scheduled for 11/13/16 at 7:30am. CM advised ok. Will mail instructions when pre-op appt is scheduled. No PA needed via [facility] website. Decision ID# [id number].

## 2016-10-09 NOTE — Telephone Encounter (Signed)
Called and informed pt of pre-op appt 11/07/16 at 11:00am. Letter also mailed with instructions.

## 2016-10-11 ENCOUNTER — Telehealth: Payer: Self-pay

## 2016-10-11 NOTE — Telephone Encounter (Signed)
Pt called this morning asking "what are my limitations and how far can I walk". Pt has procedure scheduled with RMR on 11/13/16 and also had surgery by Dr Arnoldo Morale in January. I told patient that he should call his PCP or Dr Arnoldo Morale, but he said that was for his hernia. I told him that the nurse wasn't in yet and I was going to transfer his call to VM and to explain to her what he was asking and to leave a good number for her to call him back.

## 2016-10-11 NOTE — Telephone Encounter (Signed)
[phone]- Pt was inpatient at [facility], had procedure done and he wants to know if he can go outside and walk around. I dont see anything in his discharge notes telling him not to.  Is there any reason he should not be outside walking around?

## 2016-10-11 NOTE — Telephone Encounter (Signed)
No contraindication for ambulating after EGD 7 days ago. As with all exercise, he should do so to the limit of his comfort.

## 2016-10-11 NOTE — Telephone Encounter (Signed)
Tried to call pt- NA 

## 2016-10-12 NOTE — Telephone Encounter (Signed)
Pt is aware.  

## 2016-11-01 NOTE — Patient Instructions (Signed)
Stephen Sullivan  11/01/2016     @PREFPERIOPPHARMACY @   Your procedure is scheduled on  11/13/2016   Report to [name] at  24  A.M.  Call this number if you have problems the morning of surgery:  [phone]   Remember:  Do not eat food or drink liquids after midnight.  Take these medicines the morning of surgery with A SIP OF WATER  norvasc, oxycodone, protonix.   Do not wear jewelry, make-up or nail polish.  Do not wear lotions, powders, or perfumes, or deoderant.  Do not shave 48 hours prior to surgery.  Men may shave face and neck.  Do not bring valuables to the hospital.  [facility] is not responsible for any belongings or valuables.  Contacts, dentures or bridgework may not be worn into surgery.  Leave your suitcase in the car.  After surgery it may be brought to your room.  For patients admitted to the hospital, discharge time will be determined by your treatment team.  Patients discharged the day of surgery will not be allowed to drive home.   Name and phone number of your driver:   family Special instructions:  Follow the diet instructions given to you by Dr Roseanne Kaufman office.  Please read over the following fact sheets that you were given. Anesthesia Post-op Instructions and Care and Recovery After Surgery       Esophagogastroduodenoscopy Esophagogastroduodenoscopy (EGD) is a procedure to examine the lining of the esophagus, stomach, and first part of the small intestine (duodenum). This procedure is done to check for problems such as inflammation, bleeding, ulcers, or growths. During this procedure, a long, flexible, lighted tube with a camera attached (endoscope) is inserted down the throat. Tell a health care provider about:  Any allergies you have.  All medicines you are taking, including vitamins, herbs, eye drops, creams, and over-the-counter medicines.  Any problems you or family members have had with anesthetic medicines.  Any blood  disorders you have.  Any surgeries you have had.  Any medical conditions you have.  Whether you are pregnant or may be pregnant. What are the risks? Generally, this is a safe procedure. However, problems may occur, including:  Infection.  Bleeding.  A tear (perforation) in the esophagus, stomach, or duodenum.  Trouble breathing.  Excessive sweating.  Spasms of the larynx.  A slowed heartbeat.  Low blood pressure. What happens before the procedure?  Follow instructions from your health care provider about eating or drinking restrictions.  Ask your health care provider about:  Changing or stopping your regular medicines. This is especially important if you are taking diabetes medicines or blood thinners.  Taking medicines such as aspirin and ibuprofen. These medicines can thin your blood. Do not take these medicines before your procedure if your health care provider instructs you not to.  Plan to have someone take you home after the procedure.  If you wear dentures, be ready to remove them before the procedure. What happens during the procedure?  To reduce your risk of infection, your health care team will wash or sanitize their hands.  An IV tube will be put in a vein in your hand or arm. You will get medicines and fluids through this tube.  You will be given one or more of the following:  A medicine to help you relax (sedative).  A medicine to numb the area (local anesthetic). This medicine may be sprayed into your  throat. It will make you feel more comfortable and keep you from gagging or coughing during the procedure.  A medicine for pain.  A mouth guard may be placed in your mouth to protect your teeth and to keep you from biting on the endoscope.  You will be asked to lie on your left side.  The endoscope will be lowered down your throat into your esophagus, stomach, and duodenum.  Air will be put into the endoscope. This will help your health care  provider see better.  The lining of your esophagus, stomach, and duodenum will be examined.  Your health care provider may:  Take a tissue sample so it can be looked at in a lab (biopsy).  Remove growths.  Remove objects (foreign bodies) that are stuck.  Treat any bleeding with medicines or other devices that stop tissue from bleeding.  Widen (dilate) or stretch narrowed areas of your esophagus and stomach.  The endoscope will be taken out. The procedure may vary among health care providers and hospitals. What happens after the procedure?  Your blood pressure, heart rate, breathing rate, and blood oxygen level will be monitored often until the medicines you were given have worn off.  Do not eat or drink anything until the numbing medicine has worn off and your gag reflex has returned. This information is not intended to replace advice given to you by your health care provider. Make sure you discuss any questions you have with your health care provider. Document Released: 11/24/2004 Document Revised: 12/30/2015 Document Reviewed: 06/17/2015 Elsevier Interactive Patient Education  2017 [place]. Esophagogastroduodenoscopy, Care After Refer to this sheet in the next few weeks. These instructions provide you with information about caring for yourself after your procedure. Your health care provider may also give you more specific instructions. Your treatment has been planned according to current medical practices, but problems sometimes occur. Call your health care provider if you have any problems or questions after your procedure. What can I expect after the procedure? After the procedure, it is common to have:  A sore throat.  Nausea.  Bloating.  Dizziness.  Fatigue. Follow these instructions at home:  Do not eat or drink anything until the numbing medicine (local anesthetic) has worn off and your gag reflex has returned. You will know that the local anesthetic has worn  off when you can swallow comfortably.  Do not drive for 24 hours if you received a medicine to help you relax (sedative).  If your health care provider took a tissue sample for testing during the procedure, make sure to get your test results. This is your responsibility. Ask your health care provider or the department performing the test when your results will be ready.  Keep all follow-up visits as told by your health care provider. This is important. Contact a health care provider if:  You cannot stop coughing.  You are not urinating.  You are urinating less than usual. Get help right away if:  You have trouble swallowing.  You cannot eat or drink.  You have throat or chest pain that gets worse.  You are dizzy or light-headed.  You faint.  You have nausea or vomiting.  You have chills.  You have a fever.  You have severe abdominal pain.  You have black, tarry, or bloody stools. This information is not intended to replace advice given to you by your health care provider. Make sure you discuss any questions you have with your health care provider. Document  Released: 07/10/2012 Document Revised: 12/30/2015 Document Reviewed: 06/17/2015 Elsevier Interactive Patient Education  2017 [place] Anesthesia is a term that refers to techniques, procedures, and medicines that help a person stay safe and comfortable during a medical procedure. Monitored anesthesia care, or sedation, is one type of anesthesia. Your anesthesia specialist may recommend sedation if you will be having a procedure that does not require you to be unconscious, such as:  Cataract surgery.  A dental procedure.  A biopsy.  A colonoscopy. During the procedure, you may receive a medicine to help you relax (sedative). There are three levels of sedation:  Mild sedation. At this level, you may feel awake and relaxed. You will be able to follow directions.  Moderate sedation.  At this level, you will be sleepy. You may not remember the procedure.  Deep sedation. At this level, you will be asleep. You will not remember the procedure. The more medicine you are given, the deeper your level of sedation will be. Depending on how you respond to the procedure, the anesthesia specialist may change your level of sedation or the type of anesthesia to fit your needs. An anesthesia specialist will monitor you closely during the procedure. Let your health care provider know about:  Any allergies you have.  All medicines you are taking, including vitamins, herbs, eye drops, creams, and over-the-counter medicines.  Any use of steroids (by mouth or as a cream).  Any problems you or family members have had with sedatives and anesthetic medicines.  Any blood disorders you have.  Any surgeries you have had.  Any medical conditions you have, such as sleep apnea.  Whether you are pregnant or may be pregnant.  Any use of cigarettes, alcohol, or street drugs. What are the risks? Generally, this is a safe procedure. However, problems may occur, including:  Getting too much medicine (oversedation).  Nausea.  Allergic reaction to medicines.  Trouble breathing. If this happens, a breathing tube may be used to help with breathing. It will be removed when you are awake and breathing on your own.  Heart trouble.  Lung trouble. Before the procedure Staying hydrated  Follow instructions from your health care provider about hydration, which may include:  Up to 2 hours before the procedure - you may continue to drink clear liquids, such as water, clear fruit juice, black coffee, and plain tea. Eating and drinking restrictions  Follow instructions from your health care provider about eating and drinking, which may include:  8 hours before the procedure - stop eating heavy meals or foods such as meat, fried foods, or fatty foods.  6 hours before the procedure - stop eating  light meals or foods, such as toast or cereal.  6 hours before the procedure - stop drinking milk or drinks that contain milk.  2 hours before the procedure - stop drinking clear liquids. Medicines  Ask your health care provider about:  Changing or stopping your regular medicines. This is especially important if you are taking diabetes medicines or blood thinners.  Taking medicines such as aspirin and ibuprofen. These medicines can thin your blood. Do not take these medicines before your procedure if your health care provider instructs you not to. Tests and exams  You will have a physical exam.  You may have blood tests done to show:  How well your kidneys and liver are working.  How well your blood can clot.  General instructions  Plan to have someone take you home  from the hospital or clinic.  If you will be going home right after the procedure, plan to have someone with you for 24 hours. What happens during the procedure?  Your blood pressure, heart rate, breathing, level of pain and overall condition will be monitored.  An IV tube will be inserted into one of your veins.  Your anesthesia specialist will give you medicines as needed to keep you comfortable during the procedure. This may mean changing the level of sedation.  The procedure will be performed. After the procedure  Your blood pressure, heart rate, breathing rate, and blood oxygen level will be monitored until the medicines you were given have worn off.  Do not drive for 24 hours if you received a sedative.  You may:  Feel sleepy, clumsy, or nauseous.  Feel forgetful about what happened after the procedure.  Have a sore throat if you had a breathing tube during the procedure.  Vomit. This information is not intended to replace advice given to you by your health care provider. Make sure you discuss any questions you have with your health care provider. Document Released: 04/19/2005 Document Revised:  12/31/2015 Document Reviewed: 11/14/2015 Elsevier Interactive Patient Education  2017 [place], Care After These instructions provide you with information about caring for yourself after your procedure. Your health care provider may also give you more specific instructions. Your treatment has been planned according to current medical practices, but problems sometimes occur. Call your health care provider if you have any problems or questions after your procedure. What can I expect after the procedure? After your procedure, it is common to:  Feel sleepy for several hours.  Feel clumsy and have poor balance for several hours.  Feel forgetful about what happened after the procedure.  Have poor judgment for several hours.  Feel nauseous or vomit.  Have a sore throat if you had a breathing tube during the procedure. Follow these instructions at home: For at least 24 hours after the procedure:    Do not:  Participate in activities in which you could fall or become injured.  Drive.  Use heavy machinery.  Drink alcohol.  Take sleeping pills or medicines that cause drowsiness.  Make important decisions or sign legal documents.  Take care of children on your own.  Rest. Eating and drinking   Follow the diet that is recommended by your health care provider.  If you vomit, drink water, juice, or soup when you can drink without vomiting.  Make sure you have little or no nausea before eating solid foods. General instructions   Have a responsible adult stay with you until you are awake and alert.  Take over-the-counter and prescription medicines only as told by your health care provider.  If you smoke, do not smoke without supervision.  Keep all follow-up visits as told by your health care provider. This is important. Contact a health care provider if:  You keep feeling nauseous or you keep vomiting.  You feel light-headed.  You develop a  rash.  You have a fever. Get help right away if:  You have trouble breathing. This information is not intended to replace advice given to you by your health care provider. Make sure you discuss any questions you have with your health care provider. Document Released: 11/14/2015 Document Revised: 03/15/2016 Document Reviewed: 11/14/2015 Elsevier Interactive Patient Education  2017 [employer].

## 2016-11-07 ENCOUNTER — Encounter (HOSPITAL_COMMUNITY)
Admission: RE | Admit: 2016-11-07 | Discharge: 2016-11-07 | Disposition: A | Discharge status: Home / Self Care | Payer: Medicare Other | Source: Ambulatory Visit | Attending: Internal Medicine | Admitting: Internal Medicine

## 2016-11-07 DIAGNOSIS — Z01812 Encounter for preprocedural laboratory examination: Secondary | ICD-10-CM | POA: Insufficient documentation

## 2016-11-07 LAB — CBC
HEMATOCRIT: 34 % — AB (ref 39.0–52.0)
Hemoglobin: 11.2 g/dL — ABNORMAL LOW (ref 13.0–17.0)
MCH: 28.9 pg (ref 26.0–34.0)
MCHC: 32.9 g/dL (ref 30.0–36.0)
MCV: 87.6 fL (ref 78.0–100.0)
PLATELETS: 293 10*3/uL (ref 150–400)
RBC: 3.88 MIL/uL — ABNORMAL LOW (ref 4.22–5.81)
RDW: 13.1 % (ref 11.5–15.5)
WBC: 8.7 10*3/uL (ref 4.0–10.5)

## 2016-11-07 LAB — BASIC METABOLIC PANEL
ANION GAP: 8 (ref 5–15)
BUN: 14 mg/dL (ref 6–20)
CALCIUM: 9.3 mg/dL (ref 8.9–10.3)
CO2: 24 mmol/L (ref 22–32)
Chloride: 104 mmol/L (ref 101–111)
Creatinine, Ser: 1.06 mg/dL (ref 0.61–1.24)
Glucose, Bld: 91 mg/dL (ref 65–99)
Potassium: 4 mmol/L (ref 3.5–5.1)
Sodium: 136 mmol/L (ref 135–145)

## 2016-11-13 ENCOUNTER — Encounter (HOSPITAL_COMMUNITY)
Admission: RE | Disposition: A | Discharge status: Home / Self Care | Payer: Self-pay | Source: Ambulatory Visit | Attending: Internal Medicine

## 2016-11-13 ENCOUNTER — Ambulatory Visit (HOSPITAL_COMMUNITY)
Admission: RE | Admit: 2016-11-13 | Discharge: 2016-11-13 | Disposition: A | Discharge status: Home / Self Care | Payer: Medicare Other | Source: Ambulatory Visit | Attending: Internal Medicine | Admitting: Internal Medicine

## 2016-11-13 ENCOUNTER — Encounter: Payer: Self-pay | Admitting: Internal Medicine

## 2016-11-13 DIAGNOSIS — K259 Gastric ulcer, unspecified as acute or chronic, without hemorrhage or perforation: Secondary | ICD-10-CM | POA: Insufficient documentation

## 2016-11-13 DIAGNOSIS — Z539 Procedure and treatment not carried out, unspecified reason: Secondary | ICD-10-CM | POA: Diagnosis present

## 2016-11-13 SURGERY — ESOPHAGOGASTRODUODENOSCOPY (EGD) WITH PROPOFOL
Anesthesia: Monitor Anesthesia Care

## 2017-01-09 ENCOUNTER — Other Ambulatory Visit: Payer: Self-pay

## 2017-01-09 ENCOUNTER — Ambulatory Visit (INDEPENDENT_AMBULATORY_CARE_PROVIDER_SITE_OTHER): Payer: Medicare Other | Admitting: Nurse Practitioner

## 2017-01-09 ENCOUNTER — Telehealth: Payer: Self-pay

## 2017-01-09 ENCOUNTER — Encounter: Payer: Self-pay | Admitting: Nurse Practitioner

## 2017-01-09 VITALS — BP 128/81 | HR 65 | Temp 97.1°F | Ht 71.0 in | Wt 168.4 lb

## 2017-01-09 DIAGNOSIS — F1910 Other psychoactive substance abuse, uncomplicated: Secondary | ICD-10-CM

## 2017-01-09 DIAGNOSIS — K250 Acute gastric ulcer with hemorrhage: Secondary | ICD-10-CM

## 2017-01-09 DIAGNOSIS — K5900 Constipation, unspecified: Secondary | ICD-10-CM

## 2017-01-09 DIAGNOSIS — K279 Peptic ulcer, site unspecified, unspecified as acute or chronic, without hemorrhage or perforation: Secondary | ICD-10-CM

## 2017-01-09 NOTE — Progress Notes (Signed)
cc'ed to pcp °

## 2017-01-09 NOTE — Assessment & Plan Note (Addendum)
The patient was admitted in around February 2018 for upper GI bleed and found to have a gastric ulcer with a visible vessel which was injected and heater probed. Recommended repeat EGD for surveillance which he scheduled but then canceled because of polysubstance abuse which she states he was in a car with friends who smoked marijuana and may have gotten contact high. After further discussion is clear he occasionally uses marijuana. I've advised him to stop doing this. He will need a urine drug screen prior to his upper endoscopy. We will proceed with EGD and likely gold standard/stomach biopsy to confirm eradication rather than breath testing. Return for follow-up in 3 months. Continue PPI twice daily. He has stopped taking prescription naproxen. Denies any other NSAID use or aspirin powder use.  Proceed with EGD on propofol/MAC with Dr. Gala Sullivan in near future: the risks, benefits, and alternatives have been discussed with the patient in detail. The patient states understanding and desires to proceed.  The patient is not on any anticoagulants, anxiolytics, chronic pain medications, or antidepressants. Has a history of polysubstance abuse and we will plan for the procedure on propofol/MAC to promote adequate sedation.

## 2017-01-09 NOTE — Assessment & Plan Note (Signed)
History of polysubstance abuse including marijuana and cocaine. Currently only uses marijuana which she states "I only need 2 puffs in the interim good otherwise it makes me nervous." I advise him to stop smoking marijuana. He will likely need a urine drug screen prior to his surveillance EGD due to need for propofol/MAC. Return for follow-up in 3 months. Continue PPI.

## 2017-01-09 NOTE — Telephone Encounter (Signed)
Tried to call pt to inform of pre-op appt 01/11/17 at 12:45pm. No answer, unable to leave a message. Called his mother (listed as emergency contact) and informed her of appt. Pt then called office and GF informed him of pre-op appt.

## 2017-01-09 NOTE — Progress Notes (Signed)
Referring Provider: Celedonio Savage, MD Primary Care Physician:  Celedonio Savage, MD Primary GI:  Dr. Gala Romney  Chief Complaint  Patient presents with  . GI bleed    f/u, no bleeding noted, stopped Naproxen  . Abdominal Pain    right lower    HPI:   Stephen Sullivan is a 61 y.o. male who presents for post-hospitalization follow-up. The patient was admitted the hospital from 10/04/2016 through 10/08/2016 for upper GI bleed. Noted history of gastric ulcer. Presented to the ER with vomiting black/bloody as well as diaphoresis and black/bloody diarrhea. Was taking prescription naproxen regularly. EGD completed found esophagitis, nonbleeding gastric ulcer with visible vessel injected and treated with a heater probe. H. pylori positive started on clarithromycin and amoxicillin for 14 days. Recommended avoid all NSAIDs. He was treated with 72 hours of IV PPI and discharged on Protonix twice a day. Recommended repeat EGD in 3 weeks but this does not appear to have been done likely incorrect timeframe, may be due for 3 month surveillance EGD for ulcer healing.  Today he states he's doing better. He has stopped his Naproxen. He finished all his antibiotics. Has not had EGD surveillance completed. Is due for EGD and H. Pylori check for eradication. He does occasionally use Marijuana ("I only need about 2 puffs and I'm done.") Has occasional RLQ abdominal pain but has had a hernia surgery in that area, has followed up with the surgeon since then and was recommended to have follow-up appointment which he hasn't done; did some heavy lifting and may have aggravated his symptoms. Denies hematochezia, melena, N/V. Stools are hard and has to strain. Has sensation of incomplete emptying. Drinks significant amounts of tea and soft drinks. Denies chest pain, dyspnea, lightheadedness, syncope, near syncope. Denies any other upper or lower GI symptoms.  Past Medical History:  Diagnosis Date  . Arthritis   . Back pain   .  Cataracts, bilateral    immature  . GERD (gastroesophageal reflux disease)    using Baking Soda and Water per pt  . Headache(784.0)    related to neck issues  . History of bronchitis   . History of gastric ulcer 20+yrs ago  . Hypertension    takes Benazepril some days  . Joint pain   . Muscle spasm    takes Flexeril daily  . Shortness of breath    with exertion  . Weakness    and numbness in left fingers    Past Surgical History:  Procedure Laterality Date  . ANTERIOR CERVICAL DECOMP/DISCECTOMY FUSION N/A 12/18/2013   Procedure: ANTERIOR CERVICAL DECOMPRESSION/DISCECTOMY FUSION CERVICAL THREE-FOUR, FOUR-FIVE, FIVE-SIX W/ BONEGRAFT;  Surgeon: Hosie Spangle, MD;  Location: [facility];  Service: Neurosurgery;  Laterality: N/A;  ANTERIOR CERVICAL DECOMPRESSION/DISCECTOMY FUSION CERVICAL THREE-FOUR, FOUR-FIVE, FIVE-SIX W/ BONEGRAFT  . arm surgery Left   . ESOPHAGOGASTRODUODENOSCOPY (EGD) WITH PROPOFOL N/A 10/05/2016   Procedure: ESOPHAGOGASTRODUODENOSCOPY (EGD) WITH PROPOFOL;  Surgeon: Daneil Dolin, MD;  Location: [facility];  Service: Endoscopy;  Laterality: N/A;  . INGUINAL HERNIA REPAIR Right 08/18/2016   Procedure: RIGHT INGUINAL HERNORRHAPHY WITH MESH;  Surgeon: Aviva Signs, MD;  Location: [facility];  Service: General;  Laterality: Right;  . JOINT REPLACEMENT Right    hip   . SHOULDER SURGERY Right    x 3     Current Outpatient Prescriptions  Medication Sig Dispense Refill  . amLODipine (NORVASC) 10 MG tablet Take 10 mg by mouth daily.    . pantoprazole (  PROTONIX) 40 MG tablet Take 1 tablet (40 mg total) by mouth 2 (two) times daily. 180 tablet 0   No current facility-administered medications for this visit.     Allergies as of 01/09/2017 - Review Complete 01/09/2017  Allergen Reaction Noted  . Nsaids Other (See Comments) 11/02/2016    Family History  Problem Relation Age of Onset  . Prostate cancer Father   . Prostate cancer Brother   . Colon cancer Neg Hx      Social History   Social History  . Marital status: Single    Spouse name: N/A  . Number of children: N/A  . Years of education: N/A   Occupational History  . disabled    Social History Main Topics  . Smoking status: Current Some Day Smoker    Packs/day: 0.10    Years: 47.00    Types: Cigarettes  . Smokeless tobacco: Never Used  . Alcohol use No  . Drug use: Yes    Types: Marijuana, Cocaine     Comment: "takes puff off joint every now and then"  . Sexual activity: Not Currently   Other Topics Concern  . None   Social History Narrative  . None    Review of Systems: General: Negative for anorexia, weight loss, fever, chills, fatigue, weakness. ENT: Negative for hoarseness, difficulty swallowing. CV: Negative for chest pain, angina, palpitations, peripheral edema.  Respiratory: Negative for dyspnea at rest, cough, sputum, wheezing.  GI: See history of present illness. Psych: Negative for anxiety, depression, suicidal ideation, hallucinations.  Endo: Negative for unusual weight change.  Heme: Negative for bruising or bleeding.   Physical Exam: BP 128/81   Pulse 65   Temp 97.1 F (36.2 C) (Oral)   Ht 5\' 11"  (1.803 m)   Wt 168 lb 6.4 oz (76.4 kg)   BMI 23.49 kg/m  General:   Alert and oriented. Pleasant and cooperative. Well-nourished and well-developed.  Eyes:  Without icterus, sclera clear and conjunctiva pink.  Ears:  Normal auditory acuity. Cardiovascular:  S1, S2 present without murmurs appreciated. Extremities without clubbing or edema. Respiratory:  Clear to auscultation bilaterally. No wheezes, rales, or rhonchi. No distress.  Gastrointestinal:  +BS, soft, non-tender and non-distended. No HSM noted. No guarding or rebound. No masses appreciated.  Rectal:  Deferred  Musculoskalatal:  Symmetrical without gross deformities. Neurologic:  Alert and oriented x4;  grossly normal neurologically. Psych:  Alert and cooperative. Apparent anxious  mood.    01/09/2017 9:33 AM   Disclaimer: This note was dictated with voice recognition software. Similar sounding words can inadvertently be transcribed and may not be corrected upon review.

## 2017-01-09 NOTE — Assessment & Plan Note (Signed)
Noted significant constipation with hard stools that require straining. He has not tried anything for constipation. At this point I will trial him on Linzess 72 g a day for 2 weeks and request a progress report in 1-2 weeks. I informed them that the possible adverse effect of diarrhea which typically self resolves and 5 days. Return for follow-up in 3 months. Further management as per above.

## 2017-01-09 NOTE — Patient Instructions (Signed)
1. Keep taking your pantoprazole/Protonix (acid blocker). 2. Avoid alcohol and any other drugs. 3. I will give you samples of Linzess 72 g pills take once a day. We will give the samples to last a couple weeks. Call us in 1-2 weeks and let us know if it is helping you have better bowel movements. If he developed diarrhea, this should resolve on its own in about 5 days. 4. We will schedule your procedure for you. 5. Return for follow-up in 3 months.

## 2017-01-09 NOTE — Telephone Encounter (Signed)
Pt is wanting to change his EGD until sometime in July. I have him rescheduled for 02/15/17. New instructions are in the mail.

## 2017-01-11 ENCOUNTER — Inpatient Hospital Stay (HOSPITAL_COMMUNITY): Admission: RE | Admit: 2017-01-11 | Payer: Self-pay | Source: Ambulatory Visit

## 2017-01-17 ENCOUNTER — Other Ambulatory Visit: Payer: Self-pay

## 2017-01-17 ENCOUNTER — Other Ambulatory Visit: Payer: Self-pay | Admitting: Gastroenterology

## 2017-01-17 MED ORDER — PANTOPRAZOLE SODIUM 40 MG PO TBEC: 40.0000 mg | DELAYED_RELEASE_TABLET | Freq: Two times a day (BID) | ORAL | 2 refills | Status: DC

## 2017-01-25 ENCOUNTER — Other Ambulatory Visit: Payer: Self-pay

## 2017-01-25 MED ORDER — PANTOPRAZOLE SODIUM 40 MG PO TBEC: 40.0000 mg | DELAYED_RELEASE_TABLET | Freq: Two times a day (BID) | ORAL | 5 refills | Status: DC

## 2017-02-01 NOTE — Patient Instructions (Signed)
Stephen Sullivan  02/01/2017     @PREFPERIOPPHARMACY @   Your procedure is scheduled on  02/15/2017 .  Report to [facility] at  1000  A.M.  Call this number if you have problems the morning of surgery:  [phone]   Remember:  Do not eat food or drink liquids after midnight.  Take these medicines the morning of surgery with A SIP OF WATER  Protonix, amlodipine.   Do not wear jewelry, make-up or nail polish.  Do not wear lotions, powders, or perfumes, or deoderant.  Do not shave 48 hours prior to surgery.  Men may shave face and neck.  Do not bring valuables to the hospital.  [facility] is not responsible for any belongings or valuables.  Contacts, dentures or bridgework may not be worn into surgery.  Leave your suitcase in the car.  After surgery it may be brought to your room.  For patients admitted to the hospital, discharge time will be determined by your treatment team.  Patients discharged the day of surgery will not be allowed to drive home.   Name and phone number of your driver:   family Special instructions:  Follow the diet instructions given to you by Dr Roseanne Kaufman office.  Please read over the following fact sheets that you were given. Anesthesia Post-op Instructions and Care and Recovery After Surgery       Esophagogastroduodenoscopy Esophagogastroduodenoscopy (EGD) is a procedure to examine the lining of the esophagus, stomach, and first part of the small intestine (duodenum). This procedure is done to check for problems such as inflammation, bleeding, ulcers, or growths. During this procedure, a long, flexible, lighted tube with a camera attached (endoscope) is inserted down the throat. Tell a health care provider about:  Any allergies you have.  All medicines you are taking, including vitamins, herbs, eye drops, creams, and over-the-counter medicines.  Any problems you or family members have had with anesthetic medicines.  Any blood  disorders you have.  Any surgeries you have had.  Any medical conditions you have.  Whether you are pregnant or may be pregnant. What are the risks? Generally, this is a safe procedure. However, problems may occur, including:  Infection.  Bleeding.  A tear (perforation) in the esophagus, stomach, or duodenum.  Trouble breathing.  Excessive sweating.  Spasms of the larynx.  A slowed heartbeat.  Low blood pressure.  What happens before the procedure?  Follow instructions from your health care provider about eating or drinking restrictions.  Ask your health care provider about: ? Changing or stopping your regular medicines. This is especially important if you are taking diabetes medicines or blood thinners. ? Taking medicines such as aspirin and ibuprofen. These medicines can thin your blood. Do not take these medicines before your procedure if your health care provider instructs you not to.  Plan to have someone take you home after the procedure.  If you wear dentures, be ready to remove them before the procedure. What happens during the procedure?  To reduce your risk of infection, your health care team will wash or sanitize their hands.  An IV tube will be put in a vein in your hand or arm. You will get medicines and fluids through this tube.  You will be given one or more of the following: ? A medicine to help you relax (sedative). ? A medicine to numb the area (local anesthetic). This medicine may be sprayed into your  throat. It will make you feel more comfortable and keep you from gagging or coughing during the procedure. ? A medicine for pain.  A mouth guard may be placed in your mouth to protect your teeth and to keep you from biting on the endoscope.  You will be asked to lie on your left side.  The endoscope will be lowered down your throat into your esophagus, stomach, and duodenum.  Air will be put into the endoscope. This will help your health care  provider see better.  The lining of your esophagus, stomach, and duodenum will be examined.  Your health care provider may: ? Take a tissue sample so it can be looked at in a lab (biopsy). ? Remove growths. ? Remove objects (foreign bodies) that are stuck. ? Treat any bleeding with medicines or other devices that stop tissue from bleeding. ? Widen (dilate) or stretch narrowed areas of your esophagus and stomach.  The endoscope will be taken out. The procedure may vary among health care providers and hospitals. What happens after the procedure?  Your blood pressure, heart rate, breathing rate, and blood oxygen level will be monitored often until the medicines you were given have worn off.  Do not eat or drink anything until the numbing medicine has worn off and your gag reflex has returned. This information is not intended to replace advice given to you by your health care provider. Make sure you discuss any questions you have with your health care provider. Document Released: 11/24/2004 Document Revised: 12/30/2015 Document Reviewed: 06/17/2015 Elsevier Interactive Patient Education  2018 [employer]. Esophagogastroduodenoscopy, Care After Refer to this sheet in the next few weeks. These instructions provide you with information about caring for yourself after your procedure. Your health care provider may also give you more specific instructions. Your treatment has been planned according to current medical practices, but problems sometimes occur. Call your health care provider if you have any problems or questions after your procedure. What can I expect after the procedure? After the procedure, it is common to have:  A sore throat.  Nausea.  Bloating.  Dizziness.  Fatigue.  Follow these instructions at home:  Do not eat or drink anything until the numbing medicine (local anesthetic) has worn off and your gag reflex has returned. You will know that the local anesthetic has worn  off when you can swallow comfortably.  Do not drive for 24 hours if you received a medicine to help you relax (sedative).  If your health care provider took a tissue sample for testing during the procedure, make sure to get your test results. This is your responsibility. Ask your health care provider or the department performing the test when your results will be ready.  Keep all follow-up visits as told by your health care provider. This is important. Contact a health care provider if:  You cannot stop coughing.  You are not urinating.  You are urinating less than usual. Get help right away if:  You have trouble swallowing.  You cannot eat or drink.  You have throat or chest pain that gets worse.  You are dizzy or light-headed.  You faint.  You have nausea or vomiting.  You have chills.  You have a fever.  You have severe abdominal pain.  You have black, tarry, or bloody stools. This information is not intended to replace advice given to you by your health care provider. Make sure you discuss any questions you have with your health care provider.  Document Released: 07/10/2012 Document Revised: 12/30/2015 Document Reviewed: 06/17/2015 Elsevier Interactive Patient Education  2018 [place] Anesthesia is a term that refers to techniques, procedures, and medicines that help a person stay safe and comfortable during a medical procedure. Monitored anesthesia care, or sedation, is one type of anesthesia. Your anesthesia specialist may recommend sedation if you will be having a procedure that does not require you to be unconscious, such as:  Cataract surgery.  A dental procedure.  A biopsy.  A colonoscopy.  During the procedure, you may receive a medicine to help you relax (sedative). There are three levels of sedation:  Mild sedation. At this level, you may feel awake and relaxed. You will be able to follow directions.  Moderate  sedation. At this level, you will be sleepy. You may not remember the procedure.  Deep sedation. At this level, you will be asleep. You will not remember the procedure.  The more medicine you are given, the deeper your level of sedation will be. Depending on how you respond to the procedure, the anesthesia specialist may change your level of sedation or the type of anesthesia to fit your needs. An anesthesia specialist will monitor you closely during the procedure. Let your health care provider know about:  Any allergies you have.  All medicines you are taking, including vitamins, herbs, eye drops, creams, and over-the-counter medicines.  Any use of steroids (by mouth or as a cream).  Any problems you or family members have had with sedatives and anesthetic medicines.  Any blood disorders you have.  Any surgeries you have had.  Any medical conditions you have, such as sleep apnea.  Whether you are pregnant or may be pregnant.  Any use of cigarettes, alcohol, or street drugs. What are the risks? Generally, this is a safe procedure. However, problems may occur, including:  Getting too much medicine (oversedation).  Nausea.  Allergic reaction to medicines.  Trouble breathing. If this happens, a breathing tube may be used to help with breathing. It will be removed when you are awake and breathing on your own.  Heart trouble.  Lung trouble.  Before the procedure Staying hydrated Follow instructions from your health care provider about hydration, which may include:  Up to 2 hours before the procedure - you may continue to drink clear liquids, such as water, clear fruit juice, black coffee, and plain tea.  Eating and drinking restrictions Follow instructions from your health care provider about eating and drinking, which may include:  8 hours before the procedure - stop eating heavy meals or foods such as meat, fried foods, or fatty foods.  6 hours before the procedure -  stop eating light meals or foods, such as toast or cereal.  6 hours before the procedure - stop drinking milk or drinks that contain milk.  2 hours before the procedure - stop drinking clear liquids.  Medicines Ask your health care provider about:  Changing or stopping your regular medicines. This is especially important if you are taking diabetes medicines or blood thinners.  Taking medicines such as aspirin and ibuprofen. These medicines can thin your blood. Do not take these medicines before your procedure if your health care provider instructs you not to.  Tests and exams  You will have a physical exam.  You may have blood tests done to show: ? How well your kidneys and liver are working. ? How well your blood can clot.  General instructions  Plan to have  someone take you home from the hospital or clinic.  If you will be going home right after the procedure, plan to have someone with you for 24 hours.  What happens during the procedure?  Your blood pressure, heart rate, breathing, level of pain and overall condition will be monitored.  An IV tube will be inserted into one of your veins.  Your anesthesia specialist will give you medicines as needed to keep you comfortable during the procedure. This may mean changing the level of sedation.  The procedure will be performed. After the procedure  Your blood pressure, heart rate, breathing rate, and blood oxygen level will be monitored until the medicines you were given have worn off.  Do not drive for 24 hours if you received a sedative.  You may: ? Feel sleepy, clumsy, or nauseous. ? Feel forgetful about what happened after the procedure. ? Have a sore throat if you had a breathing tube during the procedure. ? Vomit. This information is not intended to replace advice given to you by your health care provider. Make sure you discuss any questions you have with your health care provider. Document Released: 04/19/2005  Document Revised: 12/31/2015 Document Reviewed: 11/14/2015 Elsevier Interactive Patient Education  2018 [place], Care After These instructions provide you with information about caring for yourself after your procedure. Your health care provider may also give you more specific instructions. Your treatment has been planned according to current medical practices, but problems sometimes occur. Call your health care provider if you have any problems or questions after your procedure. What can I expect after the procedure? After your procedure, it is common to:  Feel sleepy for several hours.  Feel clumsy and have poor balance for several hours.  Feel forgetful about what happened after the procedure.  Have poor judgment for several hours.  Feel nauseous or vomit.  Have a sore throat if you had a breathing tube during the procedure.  Follow these instructions at home: For at least 24 hours after the procedure:   Do not: ? Participate in activities in which you could fall or become injured. ? Drive. ? Use heavy machinery. ? Drink alcohol. ? Take sleeping pills or medicines that cause drowsiness. ? Make important decisions or sign legal documents. ? Take care of children on your own.  Rest. Eating and drinking  Follow the diet that is recommended by your health care provider.  If you vomit, drink water, juice, or soup when you can drink without vomiting.  Make sure you have little or no nausea before eating solid foods. General instructions  Have a responsible adult stay with you until you are awake and alert.  Take over-the-counter and prescription medicines only as told by your health care provider.  If you smoke, do not smoke without supervision.  Keep all follow-up visits as told by your health care provider. This is important. Contact a health care provider if:  You keep feeling nauseous or you keep vomiting.  You feel  light-headed.  You develop a rash.  You have a fever. Get help right away if:  You have trouble breathing. This information is not intended to replace advice given to you by your health care provider. Make sure you discuss any questions you have with your health care provider. Document Released: 11/14/2015 Document Revised: 03/15/2016 Document Reviewed: 11/14/2015 Elsevier Interactive Patient Education  [employer].

## 2017-02-12 ENCOUNTER — Encounter (HOSPITAL_COMMUNITY): Payer: Self-pay

## 2017-02-12 ENCOUNTER — Encounter (HOSPITAL_COMMUNITY)
Admission: RE | Admit: 2017-02-12 | Discharge: 2017-02-12 | Disposition: A | Discharge status: Home / Self Care | Payer: Medicare Other | Source: Ambulatory Visit | Attending: Internal Medicine | Admitting: Internal Medicine

## 2017-02-12 DIAGNOSIS — K210 Gastro-esophageal reflux disease with esophagitis: Secondary | ICD-10-CM | POA: Diagnosis not present

## 2017-02-12 DIAGNOSIS — F17210 Nicotine dependence, cigarettes, uncomplicated: Secondary | ICD-10-CM | POA: Diagnosis not present

## 2017-02-12 DIAGNOSIS — I10 Essential (primary) hypertension: Secondary | ICD-10-CM | POA: Diagnosis not present

## 2017-02-12 DIAGNOSIS — Z13810 Encounter for screening for upper gastrointestinal disorder: Secondary | ICD-10-CM | POA: Diagnosis not present

## 2017-02-12 DIAGNOSIS — Z8711 Personal history of peptic ulcer disease: Secondary | ICD-10-CM | POA: Diagnosis not present

## 2017-02-12 LAB — CBC
HEMATOCRIT: 40 % (ref 39.0–52.0)
Hemoglobin: 12.5 g/dL — ABNORMAL LOW (ref 13.0–17.0)
MCH: 24.3 pg — ABNORMAL LOW (ref 26.0–34.0)
MCHC: 31.3 g/dL (ref 30.0–36.0)
MCV: 77.8 fL — AB (ref 78.0–100.0)
Platelets: 263 10*3/uL (ref 150–400)
RBC: 5.14 MIL/uL (ref 4.22–5.81)
RDW: 16 % — ABNORMAL HIGH (ref 11.5–15.5)
WBC: 8.8 10*3/uL (ref 4.0–10.5)

## 2017-02-12 LAB — BASIC METABOLIC PANEL
ANION GAP: 8 (ref 5–15)
BUN: 12 mg/dL (ref 6–20)
CHLORIDE: 102 mmol/L (ref 101–111)
CO2: 26 mmol/L (ref 22–32)
Calcium: 9.5 mg/dL (ref 8.9–10.3)
Creatinine, Ser: 1.13 mg/dL (ref 0.61–1.24)
GFR calc Af Amer: >60 mL/min (ref >60)
GFR calc non Af Amer: >60 mL/min (ref >60)
GLUCOSE: 86 mg/dL (ref 65–99)
POTASSIUM: 4.5 mmol/L (ref 3.5–5.1)
Sodium: 136 mmol/L (ref 135–145)

## 2017-02-14 ENCOUNTER — Telehealth: Payer: Self-pay

## 2017-02-14 NOTE — Telephone Encounter (Signed)
Called pt to see if he could arrive at the hospital at 8:30am tomorrow morning for his EGD. He said he would call his sister to see if she could take him earlier. He called office back and said he could arrive at 8:30am.

## 2017-02-15 ENCOUNTER — Encounter (HOSPITAL_COMMUNITY)
Admission: RE | Disposition: A | Discharge status: Home / Self Care | Payer: Self-pay | Source: Ambulatory Visit | Attending: Internal Medicine

## 2017-02-15 ENCOUNTER — Ambulatory Visit (HOSPITAL_COMMUNITY)
Admission: RE | Admit: 2017-02-15 | Discharge: 2017-02-15 | Disposition: A | Discharge status: Home / Self Care | Payer: Medicare Other | Source: Ambulatory Visit | Attending: Internal Medicine | Admitting: Internal Medicine

## 2017-02-15 ENCOUNTER — Encounter (HOSPITAL_COMMUNITY): Payer: Self-pay

## 2017-02-15 ENCOUNTER — Ambulatory Visit (HOSPITAL_COMMUNITY): Payer: Medicare Other | Admitting: Anesthesiology

## 2017-02-15 DIAGNOSIS — K210 Gastro-esophageal reflux disease with esophagitis: Secondary | ICD-10-CM | POA: Diagnosis not present

## 2017-02-15 DIAGNOSIS — Z13810 Encounter for screening for upper gastrointestinal disorder: Secondary | ICD-10-CM | POA: Diagnosis not present

## 2017-02-15 DIAGNOSIS — K279 Peptic ulcer, site unspecified, unspecified as acute or chronic, without hemorrhage or perforation: Secondary | ICD-10-CM

## 2017-02-15 DIAGNOSIS — Z8711 Personal history of peptic ulcer disease: Secondary | ICD-10-CM | POA: Insufficient documentation

## 2017-02-15 DIAGNOSIS — I10 Essential (primary) hypertension: Secondary | ICD-10-CM | POA: Diagnosis not present

## 2017-02-15 DIAGNOSIS — F17210 Nicotine dependence, cigarettes, uncomplicated: Secondary | ICD-10-CM | POA: Insufficient documentation

## 2017-02-15 HISTORY — PX: ESOPHAGOGASTRODUODENOSCOPY (EGD) WITH PROPOFOL: SHX5813

## 2017-02-15 SURGERY — ESOPHAGOGASTRODUODENOSCOPY (EGD) WITH PROPOFOL
Anesthesia: Monitor Anesthesia Care

## 2017-02-15 MED ORDER — MIDAZOLAM HCL 2 MG/2ML IJ SOLN
1.0000 mg | INTRAMUSCULAR | Status: AC
  Administered 2017-02-15: 2 mg via INTRAVENOUS

## 2017-02-15 MED ORDER — CHLORHEXIDINE GLUCONATE CLOTH 2 % EX PADS: 6.0000 | MEDICATED_PAD | Freq: Once | CUTANEOUS | Status: DC

## 2017-02-15 MED ORDER — LIDOCAINE VISCOUS 2 % MT SOLN
15.0000 mL | Freq: Once | OROMUCOSAL | Status: AC
  Administered 2017-02-15: 15 mL via OROMUCOSAL

## 2017-02-15 MED ORDER — MIDAZOLAM HCL 2 MG/2ML IJ SOLN
INTRAMUSCULAR | Status: AC
  Filled 2017-02-15: qty 2

## 2017-02-15 MED ORDER — LIDOCAINE VISCOUS 2 % MT SOLN
OROMUCOSAL | Status: AC
  Filled 2017-02-15: qty 15

## 2017-02-15 MED ORDER — PROPOFOL 500 MG/50ML IV EMUL
INTRAVENOUS | Status: DC | PRN
Start: 2017-02-15 — End: 2017-02-15
  Administered 2017-02-15: 150 ug/kg/min via INTRAVENOUS

## 2017-02-15 MED ORDER — LACTATED RINGERS IV SOLN
INTRAVENOUS | Status: DC
  Administered 2017-02-15: 10:00:00 via INTRAVENOUS

## 2017-02-15 MED ORDER — FENTANYL CITRATE (PF) 100 MCG/2ML IJ SOLN
INTRAMUSCULAR | Status: AC
  Filled 2017-02-15: qty 2

## 2017-02-15 MED ORDER — FENTANYL CITRATE (PF) 100 MCG/2ML IJ SOLN
25.0000 ug | Freq: Once | INTRAMUSCULAR | Status: AC
  Administered 2017-02-15: 25 ug via INTRAVENOUS

## 2017-02-15 NOTE — H&P (Signed)
@LOGO @   Primary Care Physician:  Celedonio Savage, MD Primary Gastroenterologist:  Dr. Gala Romney  Pre-Procedure History & Physical: HPI:  Stephen Sullivan is a 61 y.o. male here for for surveillance EGD. History of reflux esophagitis and gastric ulcer disease diagnosed earlier this year. NSAIDs have been withdrawn from his regimen. He was treated for H. pylori. He is here for surveillance examination. He reports doing well.  Past Medical History:  Diagnosis Date  . Arthritis   . Back pain   . Cataracts, bilateral    immature  . GERD (gastroesophageal reflux disease)    using Baking Soda and Water per pt  . Headache(784.0)    related to neck issues  . History of bronchitis   . History of gastric ulcer 20+yrs ago  . Hypertension    takes Benazepril some days  . Joint pain   . Muscle spasm    takes Flexeril daily  . Shortness of breath    with exertion  . Weakness    and numbness in left fingers    Past Surgical History:  Procedure Laterality Date  . ANTERIOR CERVICAL DECOMP/DISCECTOMY FUSION N/A 12/18/2013   Procedure: ANTERIOR CERVICAL DECOMPRESSION/DISCECTOMY FUSION CERVICAL THREE-FOUR, FOUR-FIVE, FIVE-SIX W/ BONEGRAFT;  Surgeon: Hosie Spangle, MD;  Location: [facility];  Service: Neurosurgery;  Laterality: N/A;  ANTERIOR CERVICAL DECOMPRESSION/DISCECTOMY FUSION CERVICAL THREE-FOUR, FOUR-FIVE, FIVE-SIX W/ BONEGRAFT  . arm surgery Left   . ESOPHAGOGASTRODUODENOSCOPY (EGD) WITH PROPOFOL N/A 10/05/2016   Procedure: ESOPHAGOGASTRODUODENOSCOPY (EGD) WITH PROPOFOL;  Surgeon: Daneil Dolin, MD;  Location: [facility];  Service: Endoscopy;  Laterality: N/A;  . INGUINAL HERNIA REPAIR Right 08/18/2016   Procedure: RIGHT INGUINAL HERNORRHAPHY WITH MESH;  Surgeon: Aviva Signs, MD;  Location: [facility];  Service: General;  Laterality: Right;  . JOINT REPLACEMENT Right    hip   . SHOULDER SURGERY Right    x 3     Prior to Admission medications   Medication Sig Start Date End Date Taking?  Authorizing Provider  amLODipine (NORVASC) 10 MG tablet Take 10 mg by mouth daily. 07/12/16  Yes [provider]  pantoprazole (PROTONIX) 40 MG tablet Take 1 tablet (40 mg total) by mouth 2 (two) times daily before a meal. 01/25/17 04/25/17 Yes Carlis Stable, NP    Allergies as of 01/09/2017 - Review Complete 01/09/2017  Allergen Reaction Noted  . Nsaids Other (See Comments) 11/02/2016    Family History  Problem Relation Age of Onset  . Prostate cancer Father   . Prostate cancer Brother   . Colon cancer Neg Hx     Social History   Social History  . Marital status: Single    Spouse name: N/A  . Number of children: N/A  . Years of education: N/A   Occupational History  . disabled    Social History Main Topics  . Smoking status: Current Some Day Smoker    Packs/day: 0.10    Years: 47.00    Types: Cigarettes  . Smokeless tobacco: Never Used  . Alcohol use No  . Drug use: Yes    Types: Marijuana, Cocaine     Comment: "takes puff off joint every now and then"  . Sexual activity: Not Currently   Other Topics Concern  . Not on file   Social History Narrative  . No narrative on file    Review of Systems: See HPI, otherwise negative ROS  Physical Exam: BP (!) 167/101   Pulse 71   Temp  98.7 F (37.1 C) (Oral)   Resp 16   SpO2 98%  General:   Alert,  Well-developed, well-nourished, pleasant and cooperative in NAD Skin:  Intact without significant lesions or rashes. Neck:  Supple; no masses or thyromegaly. No significant cervical adenopathy. Lungs:  Clear throughout to auscultation.   No wheezes, crackles, or rhonchi. No acute distress. Heart:  Regular rate and rhythm; no murmurs, clicks, rubs,  or gallops. Abdomen: Non-distended, normal bowel sounds.  Soft and nontender without appreciable mass or hepatosplenomegaly.  Pulses:  Normal pulses noted. Extremities:  Without clubbing or edema.  Impression/Plan:  Pleasant 61 year old gentleman history of NSAID H  pylori related peptic ulcer disease. He is clinically doing very well. He is here for his surveillance EGD to assure ulcer site healed.  I have offered the patient a surveillance EGD today.  The risks, benefits, limitations, alternatives and imponderables have been reviewed with the patient. Potential for esophageal dilation, biopsy, etc. have also been reviewed.  Questions have been answered. All parties agreeable.     Notice: This dictation was prepared with Dragon dictation along with smaller phrase technology. Any transcriptional errors that result from this process are unintentional and may not be corrected upon review.

## 2017-02-15 NOTE — Transfer of Care (Signed)
Immediate Anesthesia Transfer of Care Note  Patient: Stephen Sullivan  Procedure(s) Performed: Procedure(s) with comments: ESOPHAGOGASTRODUODENOSCOPY (EGD) WITH PROPOFOL (N/A) - 1115  Patient Location: PACU  Anesthesia Type:MAC  Level of Consciousness: awake, alert , oriented and patient cooperative  Airway & Oxygen Therapy: Patient Spontanous Breathing and Patient connected to face mask oxygen  Post-op Assessment: Report given to RN and Post -op Vital signs reviewed and stable  Post vital signs: Reviewed and stable  Last Vitals:  Vitals:   02/15/17 0950 02/15/17 0955  BP: 128/87 136/85  Pulse:    Resp: 16 20  Temp:      Last Pain:  Vitals:   02/15/17 0839  TempSrc: Oral         Complications: No apparent anesthesia complications

## 2017-02-15 NOTE — Anesthesia Postprocedure Evaluation (Signed)
Anesthesia Post Note  Patient: Stephen Sullivan  Procedure(s) Performed: Procedure(s) (LRB): ESOPHAGOGASTRODUODENOSCOPY (EGD) WITH PROPOFOL (N/A)  Patient location during evaluation: PACU Anesthesia Type: MAC Level of consciousness: awake and alert and oriented Pain management: pain level controlled Vital Signs Assessment: post-procedure vital signs reviewed and stable Respiratory status: spontaneous breathing, respiratory function stable and patient connected to face mask oxygen Cardiovascular status: stable Postop Assessment: no signs of nausea or vomiting Anesthetic complications: no     Last Vitals:  Vitals:   02/15/17 0950 02/15/17 0955  BP: 128/87 136/85  Pulse:    Resp: 16 20  Temp:      Last Pain:  Vitals:   02/15/17 0839  TempSrc: Oral                 ADAMS, AMY A

## 2017-02-15 NOTE — Op Note (Signed)
[facility] Patient Name: Stephen Sullivan Procedure Date: 02/15/2017 9:40 AM MRN: [id number] Date of Birth: 12/28/1955 Attending MD: Norvel Richards , MD CSN: [id number] Age: 61 Admit Type: Outpatient Procedure:                Upper GI endoscopy Indications:              Screening procedure, Surveillance procedure Providers:                Norvel Richards, MD, Otis Peak B. Sharon Seller, RN,                            Rosina Lowenstein, RN Referring MD:              Medicines:                Propofol per Anesthesia Complications:            No immediate complications. Estimated Blood Loss:     Estimated blood loss: none. Procedure:                Pre-Anesthesia Assessment:                           - Prior to the procedure, a History and Physical                            was performed, and patient medications and                            allergies were reviewed. The patient's tolerance of                            previous anesthesia was also reviewed. The risks                            and benefits of the procedure and the sedation                            options and risks were discussed with the patient.                            All questions were answered, and informed consent                            was obtained. Prior Anticoagulants: The patient has                            taken no previous anticoagulant or antiplatelet                            agents. ASA Grade Assessment: II - A patient with                            mild systemic disease. After reviewing the risks  and benefits, the patient was deemed in                            satisfactory condition to undergo the procedure.                           After obtaining informed consent, the endoscope was                            passed under direct vision. Throughout the                            procedure, the patient's blood pressure, pulse, and   oxygen saturations were monitored continuously. The                            [id number] ([id number]) scope was introduced through the                            and advanced to the second part of duodenum. The                            upper GI endoscopy was accomplished without                            difficulty. The patient tolerated the procedure                            well. The [id number] ([id number]) scope was introduced                            through the and advanced to the second part of                            duodenum. Scope In: 10:11:31 AM Scope Out: 10:13:48 AM Total Procedure Duration: 0 hours 2 minutes 17 seconds  Findings:      The examined esophagus was normal.      The entire examined stomach was normal aside from scarindicating s.      The duodenal bulb and second portion of the duodenum were normal.       Estimated blood loss: none. Impression:               - Normal esophagus.                           - Normal stomach.                           - Normal duodenal bulb and second portion of the                            duodenum.                           - No specimens collected. Moderate Sedation:      Moderate (  conscious) sedation was personally administered by an       anesthesia professional. The following parameters were monitored: oxygen       saturation, heart rate, blood pressure, respiratory rate, EKG, adequacy       of pulmonary ventilation, and response to care. Total physician       intraservice time was 9 minutes. Recommendation:           - Patient has a contact number available for                            emergencies. The signs and symptoms of potential                            delayed complications were discussed with the                            patient. Return to normal activities tomorrow.                            Written discharge instructions were provided to the                            patient.                           -  Resume previous diet. Continue Protonix 40 mg                            daily indefinitely. Avoid all NSAIDs.                           - Continue present medications.                           - No repeat upper endoscopy.                           - Return to GI office in 6 months. Procedure Code(s):        --- Professional ---                           [phone], Esophagogastroduodenoscopy, flexible,                            transoral; diagnostic, including collection of                            specimen(s) by brushing or washing, when performed                            (separate procedure) Diagnosis Code(s):        --- Professional ---                           M03.491, Encounter for screening for upper  gastrointestinal disorder CPT copyright 2016 American Medical Association. All rights reserved. The codes documented in this report are preliminary and upon coder review may  be revised to meet current compliance requirements. Stephen Sullivan. Stephen Brummell, MD Norvel Richards, MD 02/15/2017 10:22:51 AM This report has been signed electronically. Number of Addenda: 0

## 2017-02-15 NOTE — Anesthesia Preprocedure Evaluation (Signed)
Anesthesia Evaluation  Patient identified by MRN, date of birth, ID band Patient awake    Reviewed: Allergy & Precautions, NPO status , Patient's Chart, lab work & pertinent test results  Airway Mallampati: III  TM Distance: >3 FB     Dental  (+) Teeth Intact, Dental Advisory Given   Pulmonary shortness of breath, Current Smoker,    breath sounds clear to auscultation       Cardiovascular hypertension, Pt. on medications  Rhythm:Regular Rate:Normal     Neuro/Psych  Headaches, PSYCHIATRIC DISORDERS    GI/Hepatic PUD, GERD  ,  Endo/Other    Renal/GU      Musculoskeletal   Abdominal   Peds  Hematology   Anesthesia Other Findings Polysubstance abuse.  Reproductive/Obstetrics                             Anesthesia Physical Anesthesia Plan  ASA: III  Anesthesia Plan: MAC   Post-op Pain Management:    Induction: Intravenous  PONV Risk Score and Plan:   Airway Management Planned: Simple Face Mask  Additional Equipment:   Intra-op Plan:   Post-operative Plan:   Informed Consent: I have reviewed the patients History and Physical, chart, labs and discussed the procedure including the risks, benefits and alternatives for the proposed anesthesia with the patient or authorized representative who has indicated his/her understanding and acceptance.     Plan Discussed with:   Anesthesia Plan Comments:         Anesthesia Quick Evaluation

## 2017-02-15 NOTE — Anesthesia Procedure Notes (Signed)
Procedure Name: MAC Date/Time: 02/15/2017 10:01 AM Performed by: Andree Elk, Dwain Huhn A Pre-anesthesia Checklist: Patient identified, Emergency Drugs available, Suction available, Patient being monitored and Timeout performed Oxygen Delivery Method: Simple face mask

## 2017-02-15 NOTE — Discharge Instructions (Signed)
EGD Discharge instructions Please read the instructions outlined below and refer to this sheet in the next few weeks. These discharge instructions provide you with general information on caring for yourself after you leave the hospital. Your doctor may also give you specific instructions. While your treatment has been planned according to the most current medical practices available, unavoidable complications occasionally occur. If you have any problems or questions after discharge, please call your doctor. ACTIVITY  You may resume your regular activity but move at a slower pace for the next 24 hours.   Take frequent rest periods for the next 24 hours.   Walking will help expel (get rid of) the air and reduce the bloated feeling in your abdomen.   No driving for 24 hours (because of the anesthesia (medicine) used during the test).   You may shower.   Do not sign any important legal documents or operate any machinery for 24 hours (because of the anesthesia used during the test).  NUTRITION  Drink plenty of fluids.   You may resume your normal diet.   Begin with a light meal and progress to your normal diet.   Avoid alcoholic beverages for 24 hours or as instructed by your caregiver.  MEDICATIONS  You may resume your normal medications unless your caregiver tells you otherwise.  WHAT YOU CAN EXPECT TODAY  You may experience abdominal discomfort such as a feeling of fullness or gas pains.  FOLLOW-UP  Your doctor will discuss the results of your test with you.  SEEK IMMEDIATE MEDICAL ATTENTION IF ANY OF THE FOLLOWING OCCUR:  Excessive nausea (feeling sick to your stomach) and/or vomiting.   Severe abdominal pain and distention (swelling).   Trouble swallowing.   Temperature over 101 F (37.8 C).   Rectal bleeding or vomiting of blood.    Avoid NSAIDs including Advil and Aleve  GERD information provided  Continue Protonix 40 mg daily  Office visit with [place] in 6  months    Gastroesophageal Reflux Disease, Adult Normally, food travels down the esophagus and stays in the stomach to be digested. If a person has gastroesophageal reflux disease (GERD), food and stomach acid move back up into the esophagus. When this happens, the esophagus becomes sore and swollen (inflamed). Over time, GERD can make small holes (ulcers) in the lining of the esophagus. Follow these instructions at home: Diet  Follow a diet as told by your doctor. You may need to avoid foods and drinks such as: ? Coffee and tea (with or without caffeine). ? Drinks that contain alcohol. ? Energy drinks and sports drinks. ? Carbonated drinks or sodas. ? Chocolate and cocoa. ? Peppermint and mint flavorings. ? Garlic and onions. ? Horseradish. ? Spicy and acidic foods, such as peppers, chili powder, curry powder, vinegar, hot sauces, and BBQ sauce. ? Citrus fruit juices and citrus fruits, such as oranges, lemons, and limes. ? Tomato-based foods, such as red sauce, chili, salsa, and pizza with red sauce. ? Fried and fatty foods, such as donuts, french fries, potato chips, and high-fat dressings. ? High-fat meats, such as hot dogs, rib eye steak, sausage, ham, and bacon. ? High-fat dairy items, such as whole milk, butter, and cream cheese.  Eat small meals often. Avoid eating large meals.  Avoid drinking large amounts of liquid with your meals.  Avoid eating meals during the 2-3 hours before bedtime.  Avoid lying down right after you eat.  Do not exercise right after you eat. General instructions  Pay  attention to any changes in your symptoms.  Take over-the-counter and prescription medicines only as told by your doctor. Do not take aspirin, ibuprofen, or other NSAIDs unless your doctor says it is okay.  Do not use any tobacco products, including cigarettes, chewing tobacco, and e-cigarettes. If you need help quitting, ask your doctor.  Wear loose clothes. Do not wear anything  tight around your waist.  Raise (elevate) the head of your bed about 6 inches (15 cm).  Try to lower your stress. If you need help doing this, ask your doctor.  If you are overweight, lose an amount of weight that is healthy for you. Ask your doctor about a safe weight loss goal.  Keep all follow-up visits as told by your doctor. This is important. Contact a doctor if:  You have new symptoms.  You lose weight and you do not know why it is happening.  You have trouble swallowing, or it hurts to swallow.  You have wheezing or a cough that keeps happening.  Your symptoms do not get better with treatment.  You have a hoarse voice. Get help right away if:  You have pain in your arms, neck, jaw, teeth, or back.  You feel sweaty, dizzy, or light-headed.  You have chest pain or shortness of breath.  You throw up (vomit) and your throw up looks like blood or coffee grounds.  You pass out (faint).  Your poop (stool) is bloody or black.  You cannot swallow, drink, or eat. This information is not intended to replace advice given to you by your health care provider. Make sure you discuss any questions you have with your health care provider. Document Released: 01/10/2008 Document Revised: 12/30/2015 Document Reviewed: 11/18/2014 Elsevier Interactive Patient Education  2018 [place] POST-ANESTHESIA  IMMEDIATELY FOLLOWING SURGERY:  Do not drive or operate machinery for the first twenty four hours after surgery.  Do not make any important decisions for twenty four hours after surgery or while taking narcotic pain medications or sedatives.  If you develop intractable nausea and vomiting or a severe headache please notify your doctor immediately.  FOLLOW-UP:  Please make an appointment with your surgeon as instructed. You do not need to follow up with anesthesia unless specifically instructed to do so.  WOUND CARE INSTRUCTIONS (if applicable):  Keep a dry  clean dressing on the anesthesia/puncture wound site if there is drainage.  Once the wound has quit draining you may leave it open to air.  Generally you should leave the bandage intact for twenty four hours unless there is drainage.  If the epidural site drains for more than 36-48 hours please call the anesthesia department.  QUESTIONS?:  Please feel free to call your physician or the hospital operator if you have any questions, and they will be happy to assist you.

## 2017-02-19 ENCOUNTER — Encounter (HOSPITAL_COMMUNITY): Payer: Self-pay | Admitting: Internal Medicine

## 2017-04-11 ENCOUNTER — Encounter: Payer: Self-pay | Admitting: Nurse Practitioner

## 2017-04-11 ENCOUNTER — Ambulatory Visit (INDEPENDENT_AMBULATORY_CARE_PROVIDER_SITE_OTHER): Payer: Medicare Other | Admitting: Nurse Practitioner

## 2017-04-11 VITALS — BP 149/83 | HR 85 | Temp 97.2°F | Ht 69.0 in | Wt 170.0 lb

## 2017-04-11 DIAGNOSIS — K5900 Constipation, unspecified: Secondary | ICD-10-CM | POA: Diagnosis not present

## 2017-04-11 DIAGNOSIS — K250 Acute gastric ulcer with hemorrhage: Secondary | ICD-10-CM | POA: Diagnosis not present

## 2017-04-11 MED ORDER — PANTOPRAZOLE SODIUM 40 MG PO TBEC: 40.0000 mg | DELAYED_RELEASE_TABLET | Freq: Every day | ORAL | 5 refills | Status: DC

## 2017-04-11 NOTE — Progress Notes (Signed)
cc'ed to pcp °

## 2017-04-11 NOTE — Assessment & Plan Note (Signed)
Patient attempted to take Lantus 72 g once a day but this is too much for him. He is no longer having constipation, he just sits some coffee in the morning which tends to allow daily productive bowel movements. No rectal bleeding. No other GI symptoms. Return for follow-up in one year.

## 2017-04-11 NOTE — Progress Notes (Signed)
Referring Provider: Celedonio Savage, MD Primary Care Physician:  Celedonio Savage, MD Primary GI:  Dr. Gala Romney  Chief Complaint  Patient presents with  . Constipation    HPI:   Stephen Sullivan is a 61 y.o. male who presents For follow-up on constipation and peptic ulcer disease. The patient was last seen in our office 01/09/2017. He had recently been admitted in February and March 2018 for upper GI bleed with a history of gastric ulcer. Was on regular PPIs. EGD found esophagitis, nonbleeding gastric ulcer with visible vessel injected with an treated with a heater probe. H. pylori positive started on clarithromycin and amoxicillin for 14 days. Recommended avoid all NSAIDs. He was treated with 72 hours of IV PPI drip and discharged on Protonix twice a day. Recommended repeat EGD in 3 weeks. At his last visit he was doing well, stop PPIs, finished antibiotics. He is due for EGD with H. pylori check for eradication. At that time he complained of occasional right lower quadrant pain and history of hernia surgery in that area and was recommended to follow-up with surgery which he had not done. No further GI symptoms. Recommended continue PPI twice a day, avoid alcohol and any drugs. Was given Linzess 72 g pills for constipation, recommended repeat EGD.  Surveillance EGD was completed 02/15/2017 which found normal esophagus, normal stomach, normal duodenum. Recommended resume previous diet, continue Protonix 40 mg daily indefinitely, avoid all NSAIDs.  Today he states he's doing well overall. States he couldn't use Linzess because it was too strong for him. Is not having any more constipation when he has a sip of coffee. Denies abdominal pain, N/V, hematochezia, melena, fever, chills. He has put on a couple pounds. Is having fatigue and PCP is aware. They are evaluating his BP. Some dyspnea at baseline due to chronic smoking. Denies chest pain, dizziness, lightheadedness, syncope, near syncope. Denies any other  upper or lower GI symptoms.  Past Medical History:  Diagnosis Date  . Arthritis   . Back pain   . Cataracts, bilateral    immature  . GERD (gastroesophageal reflux disease)    using Baking Soda and Water per pt  . Headache(784.0)    related to neck issues  . History of bronchitis   . History of gastric ulcer 20+yrs ago  . Hypertension    takes Benazepril some days  . Joint pain   . Muscle spasm    takes Flexeril daily  . Shortness of breath    with exertion  . Weakness    and numbness in left fingers    Past Surgical History:  Procedure Laterality Date  . ANTERIOR CERVICAL DECOMP/DISCECTOMY FUSION N/A 12/18/2013   Procedure: ANTERIOR CERVICAL DECOMPRESSION/DISCECTOMY FUSION CERVICAL THREE-FOUR, FOUR-FIVE, FIVE-SIX W/ BONEGRAFT;  Surgeon: Hosie Spangle, MD;  Location: [facility];  Service: Neurosurgery;  Laterality: N/A;  ANTERIOR CERVICAL DECOMPRESSION/DISCECTOMY FUSION CERVICAL THREE-FOUR, FOUR-FIVE, FIVE-SIX W/ BONEGRAFT  . arm surgery Left   . ESOPHAGOGASTRODUODENOSCOPY (EGD) WITH PROPOFOL N/A 10/05/2016   Procedure: ESOPHAGOGASTRODUODENOSCOPY (EGD) WITH PROPOFOL;  Surgeon: Daneil Dolin, MD;  Location: [facility];  Service: Endoscopy;  Laterality: N/A;  . ESOPHAGOGASTRODUODENOSCOPY (EGD) WITH PROPOFOL N/A 02/15/2017   Procedure: ESOPHAGOGASTRODUODENOSCOPY (EGD) WITH PROPOFOL;  Surgeon: Daneil Dolin, MD;  Location: [facility];  Service: Endoscopy;  Laterality: N/A;  1115  . INGUINAL HERNIA REPAIR Right 08/18/2016   Procedure: RIGHT INGUINAL HERNORRHAPHY WITH MESH;  Surgeon: Aviva Signs, MD;  Location: [facility];  Service:  General;  Laterality: Right;  . JOINT REPLACEMENT Right    hip   . SHOULDER SURGERY Right    x 3     Current Outpatient Prescriptions  Medication Sig Dispense Refill  . amLODipine (NORVASC) 10 MG tablet Take 10 mg by mouth daily.    . pantoprazole (PROTONIX) 40 MG tablet Take 1 tablet (40 mg total) by mouth 2 (two) times daily before a meal.  60 tablet 5   No current facility-administered medications for this visit.     Allergies as of 04/11/2017 - Review Complete 04/11/2017  Allergen Reaction Noted  . Nsaids Other (See Comments) 11/02/2016    Family History  Problem Relation Age of Onset  . Prostate cancer Father   . Prostate cancer Brother   . Colon cancer Neg Hx     Social History   Social History  . Marital status: Single    Spouse name: N/A  . Number of children: N/A  . Years of education: N/A   Occupational History  . disabled    Social History Main Topics  . Smoking status: Current Some Day Smoker    Packs/day: 0.10    Years: 47.00    Types: Cigarettes  . Smokeless tobacco: Never Used  . Alcohol use No  . Drug use: Yes    Types: Marijuana, Cocaine     Comment: "takes puff off joint every now and then" Has given up cocaine 2 months ago  . Sexual activity: Not Currently   Other Topics Concern  . None   Social History Narrative  . None    Review of Systems: General: Negative for anorexia, weight loss, fever, chills, weakness. ENT: Negative for hoarseness, difficulty swallowing. CV: Negative for chest pain, angina, palpitations, peripheral edema.  Respiratory: Negative for dyspnea at rest, cough, sputum, wheezing.  GI: See history of present illness. Endo: Negative for unusual weight change.  Heme: Negative for bruising or bleeding.   Physical Exam: BP (!) 149/83   Pulse 85   Temp (!) 97.2 F (36.2 C) (Oral)   Ht 5\' 9"  (1.753 m)   Wt 170 lb (77.1 kg)   BMI 25.10 kg/m  General:   Alert and oriented. Pleasant and cooperative. Well-nourished and well-developed.  Eyes:  Without icterus, sclera clear and conjunctiva pink.  Ears:  Normal auditory acuity. Cardiovascular:  S1, S2 present without murmurs appreciated. Extremities without clubbing or edema. Respiratory:  Clear to auscultation bilaterally. No wheezes, rales, or rhonchi. No distress.  Gastrointestinal:  +BS, soft, non-tender  and non-distended. No HSM noted. No guarding or rebound. No masses appreciated.  Rectal:  Deferred  Musculoskalatal:  Symmetrical without gross deformities. Neurologic:  Alert and oriented x4;  grossly normal neurologically. Psych:  Alert and cooperative. Normal mood and affect. Heme/Lymph/Immune: No excessive bruising noted.    04/11/2017 10:01 AM   Disclaimer: This note was dictated with voice recognition software. Similar sounding words can inadvertently be transcribed and may not be corrected upon review.

## 2017-04-11 NOTE — Assessment & Plan Note (Signed)
History of gastric ulcer which led to hospitalization with hematemesis and melena. EGD found nonbleeding gastric ulcer which was treated endoscopically. He was placed on twice daily PPI for 3 months. Surveillance EGD found GASTRIC ulcer completely healed. At this point I'll have him reduce PPI to once a day. Continue to avoid NSAIDs. Call if any worsening problems. Otherwise follow-up in one year.

## 2017-04-11 NOTE — Patient Instructions (Addendum)
1. Reduce your acid blocker (Protonix) to once a day. Take this in the morning on an empty stomach. 2. Continue to use coffee to help you have good bowel movements. 3. Call us if you have any worsening symptoms. 4. Otherwise, follow-up in 6 months. 5. Call us with any questions or concerns.

## 2017-04-19 ENCOUNTER — Other Ambulatory Visit: Payer: Self-pay

## 2017-04-19 DIAGNOSIS — I739 Peripheral vascular disease, unspecified: Secondary | ICD-10-CM

## 2017-05-25 ENCOUNTER — Encounter: Payer: Self-pay | Admitting: Vascular Surgery

## 2017-05-25 ENCOUNTER — Ambulatory Visit (HOSPITAL_COMMUNITY)
Admission: RE | Admit: 2017-05-25 | Discharge: 2017-05-25 | Disposition: A | Discharge status: Home / Self Care | Payer: Medicare Other | Source: Ambulatory Visit | Attending: Vascular Surgery | Admitting: Vascular Surgery

## 2017-05-25 ENCOUNTER — Ambulatory Visit (INDEPENDENT_AMBULATORY_CARE_PROVIDER_SITE_OTHER): Payer: Medicare Other | Admitting: Vascular Surgery

## 2017-05-25 VITALS — BP 151/87 | HR 90 | Temp 97.0°F | Resp 18 | Ht 69.0 in | Wt 169.8 lb

## 2017-05-25 DIAGNOSIS — F17200 Nicotine dependence, unspecified, uncomplicated: Secondary | ICD-10-CM | POA: Insufficient documentation

## 2017-05-25 DIAGNOSIS — I10 Essential (primary) hypertension: Secondary | ICD-10-CM | POA: Diagnosis not present

## 2017-05-25 DIAGNOSIS — I739 Peripheral vascular disease, unspecified: Secondary | ICD-10-CM

## 2017-05-25 DIAGNOSIS — R9389 Abnormal findings on diagnostic imaging of other specified body structures: Secondary | ICD-10-CM | POA: Diagnosis not present

## 2017-05-25 DIAGNOSIS — R0989 Other specified symptoms and signs involving the circulatory and respiratory systems: Secondary | ICD-10-CM | POA: Diagnosis present

## 2017-05-25 DIAGNOSIS — I70213 Atherosclerosis of native arteries of extremities with intermittent claudication, bilateral legs: Secondary | ICD-10-CM | POA: Diagnosis not present

## 2017-05-25 NOTE — Progress Notes (Signed)
Patient ID: Stephen Sullivan, male   DOB: [birth year], 61 y.o.   MRN: [id number]  Reason for Consult: new evaluation (bilateral leg pain when walking long distances )   Referred by Celedonio Savage, MD  Subjective:     HPI:  Stephen Sullivan is a 61 y.o. male with history of disability secondary to back and long bone issues. He also has recent history of gastric ulceration secondary to NSAID use. He presents for bilateral thigh pain that occurs with walkingapproximately 1 block. This does relieve with rest. He is never had rest pain or tissue loss of his legs. He denies any calf cramping associated. He does continue walking. He is also a smoker smoking only a few cigarettes a week at this time. He does not take aspirin or blood thinners. He is not diabeticdoes have hypertension as another risk factor for vascular disease.  Past Medical History:  Diagnosis Date  . Arthritis   . Back pain   . Cataracts, bilateral    immature  . GERD (gastroesophageal reflux disease)    using Baking Soda and Water per pt  . Headache(784.0)    related to neck issues  . History of bronchitis   . History of gastric ulcer 20+yrs ago  . Hypertension    takes Benazepril some days  . Joint pain   . Muscle spasm    takes Flexeril daily  . Shortness of breath    with exertion  . Weakness    and numbness in left fingers   Family History  Problem Relation Age of Onset  . Prostate cancer Father   . Prostate cancer Brother   . Colon cancer Neg Hx    Past Surgical History:  Procedure Laterality Date  . ANTERIOR CERVICAL DECOMP/DISCECTOMY FUSION N/A 12/18/2013   Procedure: ANTERIOR CERVICAL DECOMPRESSION/DISCECTOMY FUSION CERVICAL THREE-FOUR, FOUR-FIVE, FIVE-SIX W/ BONEGRAFT;  Surgeon: Hosie Spangle, MD;  Location: [facility];  Service: Neurosurgery;  Laterality: N/A;  ANTERIOR CERVICAL DECOMPRESSION/DISCECTOMY FUSION CERVICAL THREE-FOUR, FOUR-FIVE, FIVE-SIX W/ BONEGRAFT  . arm surgery Left   .  ESOPHAGOGASTRODUODENOSCOPY (EGD) WITH PROPOFOL N/A 10/05/2016   Procedure: ESOPHAGOGASTRODUODENOSCOPY (EGD) WITH PROPOFOL;  Surgeon: Daneil Dolin, MD;  [facility];  Service: Endoscopy;  Laterality: N/A;  . ESOPHAGOGASTRODUODENOSCOPY (EGD) WITH PROPOFOL N/A 02/15/2017   Procedure: ESOPHAGOGASTRODUODENOSCOPY (EGD) WITH PROPOFOL;  Surgeon: Daneil Dolin, MD;  [facility];  Service: Endoscopy;  Laterality: N/A;  1115  . INGUINAL HERNIA REPAIR Right 08/18/2016   Procedure: RIGHT INGUINAL HERNORRHAPHY WITH MESH;  Surgeon: Aviva Signs, MD;  Location: [facility];  Service: General;  Laterality: Right;  . JOINT REPLACEMENT Right    hip   . SHOULDER SURGERY Right    x 3     Short Social History:  Social History  Substance Use Topics  . Smoking status: Current Some Day Smoker    Packs/day: 0.10    Years: 47.00    Types: Cigarettes  . Smokeless tobacco: Never Used  . Alcohol use No    Allergies  Allergen Reactions  . Nsaids Other (See Comments)    Not to take - "busted intestines open"    Current Outpatient Prescriptions  Medication Sig Dispense Refill  . amLODipine (NORVASC) 10 MG tablet Take 10 mg by mouth daily.    . pantoprazole (PROTONIX) 40 MG tablet Take 1 tablet (40 mg total) by mouth daily. 30 tablet 5   No current facility-administered medications for this visit.  Review of Systems  Constitutional:  Constitutional negative. HENT: HENT negative.  Eyes: Eyes negative.  Cardiovascular: Positive for claudication. Negative for chest pain and leg swelling.  GI: Positive for blood in stool.  Musculoskeletal: Positive for back pain, leg pain and joint pain.  Skin: Skin negative.  Neurological: Neurological negative. Hematologic: Hematologic/lymphatic negative.  Psychiatric: Psychiatric negative.        Objective:  Objective   Vitals:   05/25/17 0855 05/25/17 0900  BP: (!) 155/91 (!) 151/87  Pulse: 90   Resp: 18   Temp: (!) 97 F (36.1 C)     TempSrc: Oral   SpO2: (!) 18% 98%  Weight: 169 lb 12.8 oz (77 kg)   Height: 5\' 9"  (1.753 m)    Body mass index is 25.08 kg/m.  Physical Exam  Constitutional: He appears well-developed.  Cardiovascular: Normal rate.   Pulses:      Radial pulses are 2+ on the right side, and 2+ on the left side.       Femoral pulses are 2+ on the right side, and 2+ on the left side.      Popliteal pulses are 2+ on the right side, and 2+ on the left side.       Dorsalis pedis pulses are 2+ on the right side, and 2+ on the left side.       Posterior tibial pulses are 0 on the right side, and 0 on the left side.  Pulmonary/Chest: Effort normal.  Abdominal: Soft. He exhibits no mass.  Musculoskeletal: Normal range of motion. He exhibits no edema.  Skin: Skin is warm and dry.    Data: I have independently interpreted his ABIs today to be 0.99 and 1.03 with triphasic waveforms at the bilateral DPs.     Assessment/Plan:    61yo male with history of bilateral thigh cramps with walking 1 block is relieved with rest. No rest pain or tissue loss does have palpable dorsalis pedis pulses. Possible that he does have more proximal aortoiliac vascular disease but he does have palpable femoral pulses as well. I've asked him to take enteric-coated 81 mg aspirin and instructed him on a walking program. We will have him follow up in 1 year with repeat ABIs.     Waynetta Sandy MD [facility]

## 2017-06-19 NOTE — Addendum Note (Signed)
Addended by: Lianne Cure A on: 06/19/2017 03:39 PM   Modules accepted: Orders

## 2017-08-22 ENCOUNTER — Encounter: Payer: Self-pay | Admitting: Internal Medicine

## 2017-09-13 ENCOUNTER — Ambulatory Visit (INDEPENDENT_AMBULATORY_CARE_PROVIDER_SITE_OTHER): Payer: Self-pay | Admitting: Orthopaedic Surgery

## 2017-09-13 ENCOUNTER — Encounter (INDEPENDENT_AMBULATORY_CARE_PROVIDER_SITE_OTHER): Payer: Self-pay | Admitting: Orthopaedic Surgery

## 2017-09-13 VITALS — BP 134/98 | HR 82 | Ht 70.0 in | Wt 175.0 lb

## 2017-09-13 DIAGNOSIS — M545 Low back pain, unspecified: Secondary | ICD-10-CM

## 2017-09-13 NOTE — Progress Notes (Addendum)
Office Visit Note   Patient: Stephen Sullivan           Date of Birth: 1955-08-28           MRN: [id number] Visit Date: 09/13/2017              Requested by: Celedonio Savage, MD [address] [place], [place] [address] PCP: Celedonio Savage, MD   Assessment & Plan: Visit Diagnoses:  1. Low back pain without sciatica, unspecified back pain laterality, unspecified chronicity     Plan: We discussed with him since he does not have problems with diabetes he had recurrent episode with severe pain etc. prednisone pack but there could be some risk for GI problems.  Currently he has no evidence of radiculopathy and no nerve root tension signs.  No indications for further imaging and today states he is feeling better and this has been much better the last several days.  We discussed smoking cessation walking program.  Follow-Up Instructions: Return if symptoms worsen or fail to improve.   Orders:  No orders of the defined types were placed in this encounter.  No orders of the defined types were placed in this encounter.     Procedures: No procedures performed   Clinical Data: No additional findings.   Subjective: Chief Complaint  Patient presents with  . Lower Back - Pain    HPI 62 year old male with had intermittent symptoms at times it was so severe he had difficulty getting out of bed walking.  At that time he had some problems urinating patient.  Flexeril he had stopped anti-inflammatories due to bleeding ulcer.  He does smoke but states that back to about a pack a day.  Past history of polysubstance abuse.  Patient is disabled has not worked in many years.  She has more symptoms on the right leg than left leg when it occurs better today and states at times he has trouble getting upright walks.  Negative for chills or fever.  No history of previous back surgeries.  Previous cervical fusion by Dr. Ernestina Patches did well.  Review of Systems positive for previous cervical fusion for cervical  spondylosis tobacco dependence, polysubstance abuse disorder GI bleed.  History of back pain.  Previous right total hip arthroplasty by likely Dr. Erlinda Hong at 15 years ago.  3 shoulder surgeries.  Previous hernia repair.  Positive for cataracts as well.   Objective: Vital Signs: BP (!) 134/98   Pulse 82   Ht 5\' 10"  (1.778 m)   Wt 175 lb (79.4 kg)   BMI 25.11 kg/m   Physical Exam  Constitutional: He is oriented to person, place, and time. He appears well-developed and well-nourished.  HENT:  Head: Normocephalic and atraumatic.  Eyes: EOM are normal. Pupils are equal, round, and reactive to light.  Neck: No tracheal deviation present. No thyromegaly present.  Cardiovascular: Normal rate.  Pulmonary/Chest: Effort normal. He has no wheezes.  Abdominal: Soft. Bowel sounds are normal.  Neurological: He is alert and oriented to person, place, and time.  Skin: Skin is warm and dry. Capillary refill takes less than 2 seconds.  Psychiatric: He has a normal mood and affect. His behavior is normal. Judgment and thought content normal.    Ortho Exam negative straight leg raising negative logroll knees crepitus.  Sciatic notch tenderness.  Toe walk.  Tib gastrocsoleus is strong   Specialty Comments:  No specialty comments available.  Imaging: Previous lumbar images showed some narrowing at L3-4 and  also L5-S1.  No acute changes were noted strays 2007 and 2016.   PMFS History: Patient Active Problem List   Diagnosis Date Noted  . Constipation 01/09/2017  . Gastrointestinal hemorrhage   . Acute gastric ulcer   . Acute esophagitis   . GI bleed 10/04/2016  . Polysubstance abuse ([place]) 10/04/2016  . Tobacco dependence 10/04/2016  . Cervical spondylosis 12/18/2013  . Adjustment disorder 12/20/2011   Past Medical History:  Diagnosis Date  . Arthritis   . Back pain   . Cataracts, bilateral    immature  . GERD (gastroesophageal reflux disease)    using Baking Soda and Water per pt  .  Headache(784.0)    related to neck issues  . History of bronchitis   . History of gastric ulcer 20+yrs ago  . Hypertension    takes Benazepril some days  . Joint pain   . Muscle spasm    takes Flexeril daily  . Shortness of breath    with exertion  . Weakness    and numbness in left fingers    Family History  Problem Relation Age of Onset  . Prostate cancer Father   . Prostate cancer Brother   . Colon cancer Neg Hx     Past Surgical History:  Procedure Laterality Date  . ANTERIOR CERVICAL DECOMP/DISCECTOMY FUSION N/A 12/18/2013   Procedure: ANTERIOR CERVICAL DECOMPRESSION/DISCECTOMY FUSION CERVICAL THREE-FOUR, FOUR-FIVE, FIVE-SIX W/ BONEGRAFT;  Surgeon: Hosie Spangle, MD;  Location: [facility];  Service: Neurosurgery;  Laterality: N/A;  ANTERIOR CERVICAL DECOMPRESSION/DISCECTOMY FUSION CERVICAL THREE-FOUR, FOUR-FIVE, FIVE-SIX W/ BONEGRAFT  . arm surgery Left   . ESOPHAGOGASTRODUODENOSCOPY (EGD) WITH PROPOFOL N/A 10/05/2016   Procedure: ESOPHAGOGASTRODUODENOSCOPY (EGD) WITH PROPOFOL;  Surgeon: Daneil Dolin, MD;  [facility];  Service: Endoscopy;  Laterality: N/A;  . ESOPHAGOGASTRODUODENOSCOPY (EGD) WITH PROPOFOL N/A 02/15/2017   Procedure: ESOPHAGOGASTRODUODENOSCOPY (EGD) WITH PROPOFOL;  Surgeon: Daneil Dolin, MD;  [facility];  Service: Endoscopy;  Laterality: N/A;  1115  . INGUINAL HERNIA REPAIR Right 08/18/2016   Procedure: RIGHT INGUINAL HERNORRHAPHY WITH MESH;  Surgeon: Aviva Signs, MD;  Location: [facility];  Service: General;  Laterality: Right;  . JOINT REPLACEMENT Right    hip   . SHOULDER SURGERY Right    x 3    Social History   Occupational History  . Occupation: disabled  Tobacco Use  . Smoking status: Current Some Day Smoker    Packs/day: 0.10    Years: 47.00    Pack years: 4.70    Types: Cigarettes  . Smokeless tobacco: Never Used  Substance and Sexual Activity  . Alcohol use: No  . Drug use: Yes    Types: Marijuana, Cocaine     Comment: "takes puff off joint every now and then" Has given up cocaine 2 months ago  . Sexual activity: Not Currently

## 2017-10-09 ENCOUNTER — Ambulatory Visit: Payer: Self-pay | Admitting: Nurse Practitioner

## 2017-10-15 ENCOUNTER — Encounter: Payer: Self-pay | Admitting: Nurse Practitioner

## 2017-10-15 ENCOUNTER — Encounter: Payer: Self-pay | Admitting: *Deleted

## 2017-10-15 ENCOUNTER — Other Ambulatory Visit: Payer: Self-pay | Admitting: *Deleted

## 2017-10-15 ENCOUNTER — Ambulatory Visit (INDEPENDENT_AMBULATORY_CARE_PROVIDER_SITE_OTHER): Payer: Medicare Other | Admitting: Nurse Practitioner

## 2017-10-15 VITALS — BP 128/95 | HR 69 | Temp 96.8°F | Ht 70.0 in | Wt 181.2 lb

## 2017-10-15 DIAGNOSIS — K219 Gastro-esophageal reflux disease without esophagitis: Secondary | ICD-10-CM | POA: Diagnosis not present

## 2017-10-15 DIAGNOSIS — K5900 Constipation, unspecified: Secondary | ICD-10-CM

## 2017-10-15 DIAGNOSIS — Z1211 Encounter for screening for malignant neoplasm of colon: Secondary | ICD-10-CM

## 2017-10-15 MED ORDER — PEG 3350-KCL-NA BICARB-NACL 420 G PO SOLR: 4000.0000 mL | Freq: Once | ORAL | 0 refills | Status: AC

## 2017-10-15 NOTE — Assessment & Plan Note (Signed)
He states he felt better on PPI twice daily, although he admits his GERD is significantly improved and has rare breakthrough symptoms.  At this time, I will continue him on once daily PPI due to history of ulcer with visible vessel.  Surveillance EGD with ulcer healed.  Follow-up in 3 months.

## 2017-10-15 NOTE — Patient Instructions (Signed)
1. Continue taking your acid blocker. 2. Cut back on the amount of milk and cheese you eat/drink.  Increase the amount of water.  He should drink 2-3 bottles of water a day. 3. Increase the amount of fiber in your diet through fruits and vegetables. 4. Start taking a fiber supplement daily. 5. You can use MiraLAX powder, 1 dose, 1-2 times a day as needed for constipation. 6. We will schedule your colonoscopy for you. 7. Follow-up in 3 months. 8. Call us if you have any questions or concerns.   At [facility] we value your feedback. You may receive a survey about your visit today. Please share your experience as we strive to create trusing relationships with our patients to provide genuine, compassionate, quality care.

## 2017-10-15 NOTE — Progress Notes (Signed)
Referring Provider: Celedonio Savage, MD Primary Care Physician:  Celedonio Savage, MD Primary GI:  Dr. Gala Romney  Chief Complaint  Patient presents with  . Constipation    almost blacks out trying to have bm  . Gastroesophageal Reflux    felt better when took Protonix bid    HPI:   Stephen Sullivan is a 62 y.o. male who presents for follow-up on GERD and constipation.  The patient was last seen in our office 04/11/2017 for acute gastric ulcer with hemorrhage and constipation.  Previous to that he had an EGD with esophagitis and a nonbleeding gastric ulcer with visible vessel injected and treated with a heater probe, H. pylori positive treated for 14 days of amoxicillin and clarithromycin.  Surveillance EGD completed 02/15/2017 which was essentially normal.  Recommended continue Protonix 40 mg daily indefinitely and avoid all NSAIDs.  At his last visit he was doing well overall, could not use Linzess because it was too strong although he was not having any more constipation as long as he drinks coffee in the morning.  Some noted fatigue and primary care was in the process of working up his blood pressure.  No other overt GI symptoms.  Recommended reduce PPI to once daily, avoid all NSAIDs, continue current bowel regimen, follow-up in 6 months.  Today he states he's doing ok overall. Constipation worse over the past 2 months. Is gaining weight. Eating a lot of snack foods. Has a bowel movement about twice a day, mostly daily. Needs to strain, initially hard. Drinks a single bottle of water a day or less. Drinks a lot of milk. Feels milk worsens his constipation. Eats a lot of cheese. Eats about 1 serving of fruits/vegetables a day. Denies hematochezia, melena. GERD doing ok overall, felt better on twice daily PPI. Has flares about once every 2 weeks. Overall GERD much improved. Denies abdominal pain, fever, chills, unintentional weight loss. Felt a sharp pain around the area of his hernia surgery, is going to  followup with the surgeon; one occurrence today only. Denies chest pain, dyspnea, dizziness, lightheadedness, syncope, near syncope. Denies any other upper or lower GI symptoms.  States he has never had a colonoscopy.  Past Medical History:  Diagnosis Date  . Arthritis   . Back pain   . Cataracts, bilateral    immature  . GERD (gastroesophageal reflux disease)    using Baking Soda and Water per pt  . Headache(784.0)    related to neck issues  . History of bronchitis   . History of gastric ulcer 20+yrs ago  . Hypertension    takes Benazepril some days  . Joint pain   . Muscle spasm    takes Flexeril daily  . Shortness of breath    with exertion  . Weakness    and numbness in left fingers    Past Surgical History:  Procedure Laterality Date  . ANTERIOR CERVICAL DECOMP/DISCECTOMY FUSION N/A 12/18/2013   Procedure: ANTERIOR CERVICAL DECOMPRESSION/DISCECTOMY FUSION CERVICAL THREE-FOUR, FOUR-FIVE, FIVE-SIX W/ BONEGRAFT;  Surgeon: Hosie Spangle, MD;  Location: [place];  Service: Neurosurgery;  Laterality: N/A;  ANTERIOR CERVICAL DECOMPRESSION/DISCECTOMY FUSION CERVICAL THREE-FOUR, FOUR-FIVE, FIVE-SIX W/ BONEGRAFT  . arm surgery Left   . ESOPHAGOGASTRODUODENOSCOPY (EGD) WITH PROPOFOL N/A 10/05/2016   Procedure: ESOPHAGOGASTRODUODENOSCOPY (EGD) WITH PROPOFOL;  Surgeon: Daneil Dolin, MD;  [facility];  Service: Endoscopy;  Laterality: N/A;  . ESOPHAGOGASTRODUODENOSCOPY (EGD) WITH PROPOFOL N/A 02/15/2017   Procedure: ESOPHAGOGASTRODUODENOSCOPY (EGD) WITH PROPOFOL;  Surgeon: Daneil Dolin, MD;  [facility];  Service: Endoscopy;  Laterality: N/A;  1115  . INGUINAL HERNIA REPAIR Right 08/18/2016   Procedure: RIGHT INGUINAL HERNORRHAPHY WITH MESH;  Surgeon: Aviva Signs, MD;  Location: [facility];  Service: General;  Laterality: Right;  . JOINT REPLACEMENT Right    hip   . SHOULDER SURGERY Right    x 3     Current Outpatient Medications  Medication Sig  Dispense Refill  . amLODipine (NORVASC) 10 MG tablet Take 10 mg by mouth daily.    Marland Kitchen linaclotide (LINZESS) 72 MCG capsule Take 72 mcg by mouth as needed.    . pantoprazole (PROTONIX) 40 MG tablet Take 40 mg by mouth daily.    . polyethylene glycol-electrolytes (NULYTELY/GOLYTELY) 420 g solution Take 4,000 mLs by mouth once for 1 dose. 4000 mL 0   No current facility-administered medications for this visit.     Allergies as of 10/15/2017 - Review Complete 10/15/2017  Allergen Reaction Noted  . Nsaids Other (See Comments) 11/02/2016    Family History  Problem Relation Age of Onset  . Prostate cancer Father   . Prostate cancer Brother   . Colon cancer Neg Hx     Social History   Socioeconomic History  . Marital status: Single    Spouse name: None  . Number of children: None  . Years of education: None  . Highest education level: None  Social Needs  . Financial resource strain: None  . Food insecurity - worry: None  . Food insecurity - inability: None  . Transportation needs - medical: None  . Transportation needs - non-medical: None  Occupational History  . Occupation: disabled  Tobacco Use  . Smoking status: Current Some Day Smoker    Packs/day: 0.10    Years: 47.00    Pack years: 4.70    Types: Cigarettes  . Smokeless tobacco: Never Used  Substance and Sexual Activity  . Alcohol use: No  . Drug use: Yes    Types: Marijuana, Cocaine    Comment: "takes puff off joint every now and then"; denied cocaine 10/15/17  . Sexual activity: Not Currently  Other Topics Concern  . None  Social History Narrative  . None    Review of Systems: General: Negative for anorexia, weight loss, fever, chills, fatigue, weakness. ENT: Negative for hoarseness, difficulty swallowing. CV: Negative for chest pain, angina, palpitations, peripheral edema.  Respiratory: Negative for dyspnea at rest, cough, sputum, wheezing.  GI: See history of present illness. Endo: Negative for unusual  weight change.  Heme: Negative for bruising or bleeding. Allergy: Negative for rash or hives.   Physical Exam: BP (!) 128/95   Pulse 69   Temp (!) 96.8 F (36 C) (Oral)   Ht 5\' 10"  (1.778 m)   Wt 181 lb 3.2 oz (82.2 kg)   BMI 26.00 kg/m  General:   Alert and oriented. Pleasant and cooperative. Well-nourished and well-developed.  Eyes:  Without icterus, sclera clear and conjunctiva pink.  Ears:  Normal auditory acuity. Cardiovascular:  S1, S2 present without murmurs appreciated. Extremities without clubbing or edema. Respiratory:  Clear to auscultation bilaterally. No wheezes, rales, or rhonchi. No distress.  Gastrointestinal:  +BS, soft, non-tender and non-distended. No HSM noted. No guarding or rebound. No masses appreciated.  Rectal:  Deferred  Musculoskalatal:  Symmetrical without gross deformities. Neurologic:  Alert and oriented x4;  grossly normal neurologically. Psych:  Alert and cooperative. Normal mood and affect. Heme/Lymph/Immune: No excessive  bruising noted.    10/15/2017 11:51 AM   Disclaimer: This note was dictated with voice recognition software. Similar sounding words can inadvertently be transcribed and may not be corrected upon review.

## 2017-10-15 NOTE — Assessment & Plan Note (Addendum)
The patient notes some increased constipation.  Linzess was too much for him previously.  When further dissecting his dietary habits and symptoms he indicates he drinks about 1 bottle of water a day, if that, and minimal fiber intake with generally only 1 serving of fruit or vegetables.  This is likely contributing to his symptoms.  He is not on any pain medication at this time.  I recommended he increase water and fiber in his diet, start a fiber supplement, MiraLAX 1-2 times a day as needed.    Incidentally, while discussing his symptoms it was discovered he has never had a colonoscopy.  He confirm this 2 additional times during his visit.  At this time he is due for colonoscopy, especially given new constipation symptoms.  We will proceed at this time.  Proceed with TCS on propofol/MAC with Dr. Gala Romney in near future: the risks, benefits, and alternatives have been discussed with the patient in detail. The patient states understanding and desires to proceed.  The patient is not on any anticoagulants, anxiolytics, chronic pain medications, or antidepressants.  He does have a history of cocaine use but denies any recent use.  He does smoke marijuana intermittently.  Given history of polysubstance abuse we will plan for the procedure on propofol/MAC as was done for his last procedure.

## 2017-10-15 NOTE — Progress Notes (Signed)
CC'D TO PCP °

## 2017-10-17 ENCOUNTER — Telehealth: Payer: Self-pay | Admitting: *Deleted

## 2017-10-17 ENCOUNTER — Encounter: Payer: Self-pay | Admitting: *Deleted

## 2017-10-17 NOTE — Telephone Encounter (Signed)
Called spoke with pt and is aware pre-op scheduled for 11/08/17 at 12:45pm. Letter mailed

## 2017-11-06 NOTE — Patient Instructions (Signed)
Stephen Sullivan  11/06/2017     @PREFPERIOPPHARMACY @   Your procedure is scheduled on  11/15/2017   Report to [facility] at  29   A.M.  Call this number if you have problems the morning of surgery:  [phone]   Remember:  Do not eat food or drink liquids after midnight.  Take these medicines the morning of surgery with A SIP OF WATER  Norvasc, protonix, zanaflex (if needed).   Do not wear jewelry, make-up or nail polish.  Do not wear lotions, powders, or perfumes, or deodorant.  Do not shave 48 hours prior to surgery.  Men may shave face and neck.  Do not bring valuables to the hospital.  [facility] is not responsible for any belongings or valuables.  Contacts, dentures or bridgework may not be worn into surgery.  Leave your suitcase in the car.  After surgery it may be brought to your room.  For patients admitted to the hospital, discharge time will be determined by your treatment team.  Patients discharged the day of surgery will not be allowed to drive home.   Name and phone number of your driver:   family Special instructions:  Follow the diet and prep instructions given to you by Dr Roseanne Kaufman office.  Please read over the following fact sheets that you were given. Anesthesia Post-op Instructions and Care and Recovery After Surgery       Colonoscopy, Adult A colonoscopy is an exam to look at the large intestine. It is done to check for problems, such as:  Lumps (tumors).  Growths (polyps).  Swelling (inflammation).  Bleeding.  What happens before the procedure? Eating and drinking Follow instructions from your doctor about eating and drinking. These instructions may include:  A few days before the procedure - follow a low-fiber diet. ? Avoid nuts. ? Avoid seeds. ? Avoid dried fruit. ? Avoid raw fruits. ? Avoid vegetables.  1-3 days before the procedure - follow a clear liquid diet. Avoid liquids that have red or purple dye. Drink  only clear liquids, such as: ? Clear broth or bouillon. ? Black coffee or tea. ? Clear juice. ? Clear soft drinks or sports drinks. ? Gelatin dessert. ? Popsicles.  On the day of the procedure - do not eat or drink anything during the 2 hours before the procedure.  Bowel prep If you were prescribed an oral bowel prep:  Take it as told by your doctor. Starting the day before your procedure, you will need to drink a lot of liquid. The liquid will cause you to poop (have bowel movements) until your poop is almost clear or light green.  If your skin or butt gets irritated from diarrhea, you may: ? Wipe the area with wipes that have medicine in them, such as adult wet wipes with aloe and vitamin E. ? Put something on your skin that soothes the area, such as petroleum jelly.  If you throw up (vomit) while drinking the bowel prep, take a break for up to 60 minutes. Then begin the bowel prep again. If you keep throwing up and you cannot take the bowel prep without throwing up, call your doctor.  General instructions  Ask your doctor about changing or stopping your normal medicines. This is important if you take diabetes medicines or blood thinners.  Plan to have someone take you home from the hospital or clinic. What happens during the procedure?  An  IV tube may be put into one of your veins.  You will be given medicine to help you relax (sedative).  To reduce your risk of infection: ? Your doctors will wash their hands. ? Your anal area will be washed with soap.  You will be asked to lie on your side with your knees bent.  Your doctor will get a long, thin, flexible tube ready. The tube will have a camera and a light on the end.  The tube will be put into your anus.  The tube will be gently put into your large intestine.  Air will be delivered into your large intestine to keep it open. You may feel some pressure or cramping.  The camera will be used to take photos.  A small  tissue sample may be removed from your body to be looked at under a microscope (biopsy). If any possible problems are found, the tissue will be sent to a lab for testing.  If small growths are found, your doctor may remove them and have them checked for cancer.  The tube that was put into your anus will be slowly removed. The procedure may vary among doctors and hospitals. What happens after the procedure?  Your doctor will check on you often until the medicines you were given have worn off.  Do not drive for 24 hours after the procedure.  You may have a small amount of blood in your poop.  You may pass gas.  You may have mild cramps or bloating in your belly (abdomen).  It is up to you to get the results of your procedure. Ask your doctor, or the department performing the procedure, when your results will be ready. This information is not intended to replace advice given to you by your health care provider. Make sure you discuss any questions you have with your health care provider. Document Released: 08/26/2010 Document Revised: 05/24/2016 Document Reviewed: 10/05/2015 Elsevier Interactive Patient Education  2017 [employer]  Colonoscopy, Adult, Care After This sheet gives you information about how to care for yourself after your procedure. Your health care provider may also give you more specific instructions. If you have problems or questions, contact your health care provider. What can I expect after the procedure? After the procedure, it is common to have:  A small amount of blood in your stool for 24 hours after the procedure.  Some gas.  Mild abdominal cramping or bloating.  Follow these instructions at home: General instructions   For the first 24 hours after the procedure: ? Do not drive or use machinery. ? Do not sign important documents. ? Do not drink alcohol. ? Do your regular daily activities at a slower pace than normal. ? Eat soft, easy-to-digest  foods. ? Rest often.  Take over-the-counter or prescription medicines only as told by your health care provider.  It is up to you to get the results of your procedure. Ask your health care provider, or the department performing the procedure, when your results will be ready. Relieving cramping and bloating  Try walking around when you have cramps or feel bloated.  Apply heat to your abdomen as told by your health care provider. Use a heat source that your health care provider recommends, such as a moist heat pack or a heating pad. ? Place a towel between your skin and the heat source. ? Leave the heat on for 20-30 minutes. ? Remove the heat if your skin turns bright red. This is especially  important if you are unable to feel pain, heat, or cold. You may have a greater risk of getting burned. Eating and drinking  Drink enough fluid to keep your urine clear or pale yellow.  Resume your normal diet as instructed by your health care provider. Avoid heavy or fried foods that are hard to digest.  Avoid drinking alcohol for as long as instructed by your health care provider. Contact a health care provider if:  You have blood in your stool 2-3 days after the procedure. Get help right away if:  You have more than a small spotting of blood in your stool.  You pass large blood clots in your stool.  Your abdomen is swollen.  You have nausea or vomiting.  You have a fever.  You have increasing abdominal pain that is not relieved with medicine. This information is not intended to replace advice given to you by your health care provider. Make sure you discuss any questions you have with your health care provider. Document Released: 03/07/2004 Document Revised: 04/17/2016 Document Reviewed: 10/05/2015 Elsevier Interactive Patient Education  2018 [place] Anesthesia is a term that refers to techniques, procedures, and medicines that help a person stay safe  and comfortable during a medical procedure. Monitored anesthesia care, or sedation, is one type of anesthesia. Your anesthesia specialist may recommend sedation if you will be having a procedure that does not require you to be unconscious, such as:  Cataract surgery.  A dental procedure.  A biopsy.  A colonoscopy.  During the procedure, you may receive a medicine to help you relax (sedative). There are three levels of sedation:  Mild sedation. At this level, you may feel awake and relaxed. You will be able to follow directions.  Moderate sedation. At this level, you will be sleepy. You may not remember the procedure.  Deep sedation. At this level, you will be asleep. You will not remember the procedure.  The more medicine you are given, the deeper your level of sedation will be. Depending on how you respond to the procedure, the anesthesia specialist may change your level of sedation or the type of anesthesia to fit your needs. An anesthesia specialist will monitor you closely during the procedure. Let your health care provider know about:  Any allergies you have.  All medicines you are taking, including vitamins, herbs, eye drops, creams, and over-the-counter medicines.  Any use of steroids (by mouth or as a cream).  Any problems you or family members have had with sedatives and anesthetic medicines.  Any blood disorders you have.  Any surgeries you have had.  Any medical conditions you have, such as sleep apnea.  Whether you are pregnant or may be pregnant.  Any use of cigarettes, alcohol, or street drugs. What are the risks? Generally, this is a safe procedure. However, problems may occur, including:  Getting too much medicine (oversedation).  Nausea.  Allergic reaction to medicines.  Trouble breathing. If this happens, a breathing tube may be used to help with breathing. It will be removed when you are awake and breathing on your own.  Heart trouble.  Lung  trouble.  Before the procedure Staying hydrated Follow instructions from your health care provider about hydration, which may include:  Up to 2 hours before the procedure - you may continue to drink clear liquids, such as water, clear fruit juice, black coffee, and plain tea.  Eating and drinking restrictions Follow instructions from your health care provider about  eating and drinking, which may include:  8 hours before the procedure - stop eating heavy meals or foods such as meat, fried foods, or fatty foods.  6 hours before the procedure - stop eating light meals or foods, such as toast or cereal.  6 hours before the procedure - stop drinking milk or drinks that contain milk.  2 hours before the procedure - stop drinking clear liquids.  Medicines Ask your health care provider about:  Changing or stopping your regular medicines. This is especially important if you are taking diabetes medicines or blood thinners.  Taking medicines such as aspirin and ibuprofen. These medicines can thin your blood. Do not take these medicines before your procedure if your health care provider instructs you not to.  Tests and exams  You will have a physical exam.  You may have blood tests done to show: ? How well your kidneys and liver are working. ? How well your blood can clot.  General instructions  Plan to have someone take you home from the hospital or clinic.  If you will be going home right after the procedure, plan to have someone with you for 24 hours.  What happens during the procedure?  Your blood pressure, heart rate, breathing, level of pain and overall condition will be monitored.  An IV tube will be inserted into one of your veins.  Your anesthesia specialist will give you medicines as needed to keep you comfortable during the procedure. This may mean changing the level of sedation.  The procedure will be performed. After the procedure  Your blood pressure, heart rate,  breathing rate, and blood oxygen level will be monitored until the medicines you were given have worn off.  Do not drive for 24 hours if you received a sedative.  You may: ? Feel sleepy, clumsy, or nauseous. ? Feel forgetful about what happened after the procedure. ? Have a sore throat if you had a breathing tube during the procedure. ? Vomit. This information is not intended to replace advice given to you by your health care provider. Make sure you discuss any questions you have with your health care provider. Document Released: 04/19/2005 Document Revised: 12/31/2015 Document Reviewed: 11/14/2015 Elsevier Interactive Patient Education  2018 [place], Care After These instructions provide you with information about caring for yourself after your procedure. Your health care provider may also give you more specific instructions. Your treatment has been planned according to current medical practices, but problems sometimes occur. Call your health care provider if you have any problems or questions after your procedure. What can I expect after the procedure? After your procedure, it is common to:  Feel sleepy for several hours.  Feel clumsy and have poor balance for several hours.  Feel forgetful about what happened after the procedure.  Have poor judgment for several hours.  Feel nauseous or vomit.  Have a sore throat if you had a breathing tube during the procedure.  Follow these instructions at home: For at least 24 hours after the procedure:   Do not: ? Participate in activities in which you could fall or become injured. ? Drive. ? Use heavy machinery. ? Drink alcohol. ? Take sleeping pills or medicines that cause drowsiness. ? Make important decisions or sign legal documents. ? Take care of children on your own.  Rest. Eating and drinking  Follow the diet that is recommended by your health care provider.  If you vomit, drink water,  juice, or  soup when you can drink without vomiting.  Make sure you have little or no nausea before eating solid foods. General instructions  Have a responsible adult stay with you until you are awake and alert.  Take over-the-counter and prescription medicines only as told by your health care provider.  If you smoke, do not smoke without supervision.  Keep all follow-up visits as told by your health care provider. This is important. Contact a health care provider if:  You keep feeling nauseous or you keep vomiting.  You feel light-headed.  You develop a rash.  You have a fever. Get help right away if:  You have trouble breathing. This information is not intended to replace advice given to you by your health care provider. Make sure you discuss any questions you have with your health care provider. Document Released: 11/14/2015 Document Revised: 03/15/2016 Document Reviewed: 11/14/2015 Elsevier Interactive Patient Education  [employer].

## 2017-11-08 ENCOUNTER — Encounter (HOSPITAL_COMMUNITY)
Admission: RE | Admit: 2017-11-08 | Discharge: 2017-11-08 | Disposition: A | Discharge status: Home / Self Care | Payer: Medicare Other | Source: Ambulatory Visit | Attending: Internal Medicine | Admitting: Internal Medicine

## 2017-11-08 ENCOUNTER — Encounter (HOSPITAL_COMMUNITY): Payer: Self-pay

## 2017-11-08 ENCOUNTER — Other Ambulatory Visit: Payer: Self-pay

## 2017-11-08 DIAGNOSIS — Z01818 Encounter for other preprocedural examination: Secondary | ICD-10-CM | POA: Diagnosis present

## 2017-11-08 DIAGNOSIS — Z01812 Encounter for preprocedural laboratory examination: Secondary | ICD-10-CM | POA: Insufficient documentation

## 2017-11-08 LAB — RAPID URINE DRUG SCREEN, HOSP PERFORMED
Amphetamines: NOT DETECTED
BENZODIAZEPINES: NOT DETECTED
Barbiturates: NOT DETECTED
COCAINE: NOT DETECTED
OPIATES: NOT DETECTED
Tetrahydrocannabinol: POSITIVE — AB

## 2017-11-08 LAB — CBC WITH DIFFERENTIAL/PLATELET
BASOS ABS: 0 10*3/uL (ref 0.0–0.1)
BASOS PCT: 0 %
Eosinophils Absolute: 0.1 10*3/uL (ref 0.0–0.7)
Eosinophils Relative: 1 %
HCT: 43.2 % (ref 39.0–52.0)
Hemoglobin: 13.7 g/dL (ref 13.0–17.0)
Lymphocytes Relative: 30 %
Lymphs Abs: 2.6 10*3/uL (ref 0.7–4.0)
MCH: 27.5 pg (ref 26.0–34.0)
MCHC: 31.7 g/dL (ref 30.0–36.0)
MCV: 86.6 fL (ref 78.0–100.0)
MONO ABS: 0.7 10*3/uL (ref 0.1–1.0)
MONOS PCT: 8 %
NEUTROS ABS: 5.4 10*3/uL (ref 1.7–7.7)
NEUTROS PCT: 61 %
Platelets: 277 10*3/uL (ref 150–400)
RBC: 4.99 MIL/uL (ref 4.22–5.81)
RDW: 14.2 % (ref 11.5–15.5)
WBC: 8.8 10*3/uL (ref 4.0–10.5)

## 2017-11-08 LAB — BASIC METABOLIC PANEL
Anion gap: 12 (ref 5–15)
BUN: 11 mg/dL (ref 6–20)
CALCIUM: 9.5 mg/dL (ref 8.9–10.3)
CO2: 24 mmol/L (ref 22–32)
CREATININE: 1.12 mg/dL (ref 0.61–1.24)
Chloride: 102 mmol/L (ref 101–111)
Glucose, Bld: 90 mg/dL (ref 65–99)
Potassium: 3.7 mmol/L (ref 3.5–5.1)
SODIUM: 138 mmol/L (ref 135–145)

## 2017-11-08 NOTE — Pre-Procedure Instructions (Signed)
Patient expressed that he did not understand instructions given by office.  Letter reprinted and went over in detail. Verbalized understanding.

## 2017-11-15 ENCOUNTER — Ambulatory Visit (HOSPITAL_COMMUNITY)
Admission: RE | Admit: 2017-11-15 | Discharge: 2017-11-15 | Disposition: A | Discharge status: Home / Self Care | Payer: Medicare Other | Source: Ambulatory Visit | Attending: Internal Medicine | Admitting: Internal Medicine

## 2017-11-15 ENCOUNTER — Encounter (HOSPITAL_COMMUNITY): Payer: Self-pay | Admitting: *Deleted

## 2017-11-15 ENCOUNTER — Ambulatory Visit (HOSPITAL_COMMUNITY): Payer: Medicare Other | Admitting: Anesthesiology

## 2017-11-15 ENCOUNTER — Encounter (HOSPITAL_COMMUNITY)
Admission: RE | Disposition: A | Discharge status: Home / Self Care | Payer: Self-pay | Source: Ambulatory Visit | Attending: Internal Medicine

## 2017-11-15 DIAGNOSIS — F17210 Nicotine dependence, cigarettes, uncomplicated: Secondary | ICD-10-CM | POA: Insufficient documentation

## 2017-11-15 DIAGNOSIS — Z1212 Encounter for screening for malignant neoplasm of rectum: Secondary | ICD-10-CM

## 2017-11-15 DIAGNOSIS — D120 Benign neoplasm of cecum: Secondary | ICD-10-CM | POA: Diagnosis not present

## 2017-11-15 DIAGNOSIS — I10 Essential (primary) hypertension: Secondary | ICD-10-CM | POA: Insufficient documentation

## 2017-11-15 DIAGNOSIS — K219 Gastro-esophageal reflux disease without esophagitis: Secondary | ICD-10-CM | POA: Diagnosis not present

## 2017-11-15 DIAGNOSIS — Z1211 Encounter for screening for malignant neoplasm of colon: Secondary | ICD-10-CM | POA: Diagnosis not present

## 2017-11-15 DIAGNOSIS — K648 Other hemorrhoids: Secondary | ICD-10-CM | POA: Insufficient documentation

## 2017-11-15 DIAGNOSIS — Z79899 Other long term (current) drug therapy: Secondary | ICD-10-CM | POA: Diagnosis not present

## 2017-11-15 DIAGNOSIS — D123 Benign neoplasm of transverse colon: Secondary | ICD-10-CM

## 2017-11-15 HISTORY — PX: POLYPECTOMY: SHX5525

## 2017-11-15 HISTORY — PX: COLONOSCOPY WITH PROPOFOL: SHX5780

## 2017-11-15 SURGERY — COLONOSCOPY WITH PROPOFOL
Anesthesia: Monitor Anesthesia Care

## 2017-11-15 MED ORDER — PROPOFOL 10 MG/ML IV BOLUS
INTRAVENOUS | Status: DC | PRN
  Administered 2017-11-15: 50 mg via INTRAVENOUS

## 2017-11-15 MED ORDER — FENTANYL CITRATE (PF) 100 MCG/2ML IJ SOLN
INTRAMUSCULAR | Status: AC
  Filled 2017-11-15: qty 2

## 2017-11-15 MED ORDER — CHLORHEXIDINE GLUCONATE CLOTH 2 % EX PADS: 6.0000 | MEDICATED_PAD | Freq: Once | CUTANEOUS | Status: DC

## 2017-11-15 MED ORDER — LACTATED RINGERS IV SOLN
INTRAVENOUS | Status: DC
  Administered 2017-11-15: 13:00:00 via INTRAVENOUS
  Administered 2017-11-15: 1000 mL via INTRAVENOUS

## 2017-11-15 MED ORDER — ONDANSETRON HCL 4 MG/2ML IJ SOLN
INTRAMUSCULAR | Status: DC | PRN
  Administered 2017-11-15: 4 mg via INTRAVENOUS

## 2017-11-15 MED ORDER — PROPOFOL 10 MG/ML IV BOLUS
INTRAVENOUS | Status: AC
  Filled 2017-11-15: qty 40

## 2017-11-15 MED ORDER — PROPOFOL 500 MG/50ML IV EMUL
INTRAVENOUS | Status: DC | PRN
  Administered 2017-11-15: 150 ug/kg/min via INTRAVENOUS

## 2017-11-15 MED ORDER — FENTANYL CITRATE (PF) 100 MCG/2ML IJ SOLN
INTRAMUSCULAR | Status: DC | PRN
  Administered 2017-11-15 (×2): 50 ug via INTRAVENOUS

## 2017-11-15 MED ORDER — PHENYLEPHRINE 40 MCG/ML (10ML) SYRINGE FOR IV PUSH (FOR BLOOD PRESSURE SUPPORT)
PREFILLED_SYRINGE | INTRAVENOUS | Status: AC
  Filled 2017-11-15: qty 20

## 2017-11-15 MED ORDER — MIDAZOLAM HCL 2 MG/2ML IJ SOLN
INTRAMUSCULAR | Status: AC
  Filled 2017-11-15: qty 2

## 2017-11-15 MED ORDER — MIDAZOLAM HCL 5 MG/5ML IJ SOLN
INTRAMUSCULAR | Status: DC | PRN
  Administered 2017-11-15: 2 mg via INTRAVENOUS

## 2017-11-15 NOTE — H&P (Signed)
@LOGO @   Primary Care Physician:  Celedonio Savage, MD Primary Gastroenterologist:  Dr. Gala Romney  Pre-Procedure History & Physical: HPI:  Stephen Sullivan is a 62 y.o. male is here for a screening colonoscopy. No prior colonoscopy. Chronic constipation. No family history of colon cancer.  Past Medical History:  Diagnosis Date  . Arthritis   . Back pain   . Cataracts, bilateral    immature  . GERD (gastroesophageal reflux disease)    using Baking Soda and Water per pt  . Headache(784.0)    related to neck issues  . History of bronchitis   . History of gastric ulcer 20+yrs ago  . Hypertension    takes Benazepril some days  . Joint pain   . Muscle spasm    takes Flexeril daily  . Shortness of breath    with exertion  . Weakness    and numbness in left fingers    Past Surgical History:  Procedure Laterality Date  . ANTERIOR CERVICAL DECOMP/DISCECTOMY FUSION N/A 12/18/2013   Procedure: ANTERIOR CERVICAL DECOMPRESSION/DISCECTOMY FUSION CERVICAL THREE-FOUR, FOUR-FIVE, FIVE-SIX W/ BONEGRAFT;  Surgeon: Hosie Spangle, MD;  Location: [facility];  Service: Neurosurgery;  Laterality: N/A;  ANTERIOR CERVICAL DECOMPRESSION/DISCECTOMY FUSION CERVICAL THREE-FOUR, FOUR-FIVE, FIVE-SIX W/ BONEGRAFT  . arm surgery Left   . ESOPHAGOGASTRODUODENOSCOPY (EGD) WITH PROPOFOL N/A 10/05/2016   Procedure: ESOPHAGOGASTRODUODENOSCOPY (EGD) WITH PROPOFOL;  Surgeon: Daneil Dolin, MD;  Location: [facility];  Service: Endoscopy;  Laterality: N/A;  . ESOPHAGOGASTRODUODENOSCOPY (EGD) WITH PROPOFOL N/A 02/15/2017   Procedure: ESOPHAGOGASTRODUODENOSCOPY (EGD) WITH PROPOFOL;  Surgeon: Daneil Dolin, MD;  Location: [facility];  Service: Endoscopy;  Laterality: N/A;  1115  . INGUINAL HERNIA REPAIR Right 08/18/2016   Procedure: RIGHT INGUINAL HERNORRHAPHY WITH MESH;  Surgeon: Aviva Signs, MD;  Location: [facility];  Service: General;  Laterality: Right;  . JOINT REPLACEMENT Right    hip   . SHOULDER SURGERY Right     x 3     Prior to Admission medications   Medication Sig Start Date End Date Taking? Authorizing Provider  amLODipine (NORVASC) 10 MG tablet Take 10 mg by mouth daily. 07/12/16  Yes [provider]  GAVILYTE-N WITH FLAVOR PACK 420 g solution Take 4,000 mLs by mouth once. 10/15/17  Yes [provider]  pantoprazole (PROTONIX) 40 MG tablet Take 40 mg by mouth daily.   Yes [provider]  tiZANidine (ZANAFLEX) 2 MG tablet Take 2 mg by mouth 2 (two) times daily as needed for muscle spasms.    [provider]    Allergies as of 10/15/2017 - Review Complete 10/15/2017  Allergen Reaction Noted  . Nsaids Other (See Comments) 11/02/2016    Family History  Problem Relation Age of Onset  . Prostate cancer Father   . Prostate cancer Brother   . Colon cancer Neg Hx     Social History   Socioeconomic History  . Marital status: Single    Spouse name: Not on file  . Number of children: Not on file  . Years of education: Not on file  . Highest education level: Not on file  Occupational History  . Occupation: disabled  Social Needs  . Financial resource strain: Not on file  . Food insecurity:    Worry: Not on file    Inability: Not on file  . Transportation needs:    Medical: Not on file    Non-medical: Not on file  Tobacco Use  . Smoking status: Current  Some Day Smoker    Packs/day: 0.10    Years: 47.00    Pack years: 4.70    Types: Cigarettes  . Smokeless tobacco: Never Used  Substance and Sexual Activity  . Alcohol use: No  . Drug use: Yes    Types: Marijuana, Cocaine    Comment: "takes puff off joint every now and then"; denied cocaine 10/15/17  . Sexual activity: Not Currently  Lifestyle  . Physical activity:    Days per week: Not on file    Minutes per session: Not on file  . Stress: Not on file  Relationships  . Social connections:    Talks on phone: Not on file    Gets together: Not on file    Attends religious service: Not on  file    Active member of club or organization: Not on file    Attends meetings of clubs or organizations: Not on file    Relationship status: Not on file  . Intimate partner violence:    Fear of current or ex partner: Not on file    Emotionally abused: Not on file    Physically abused: Not on file    Forced sexual activity: Not on file  Other Topics Concern  . Not on file  Social History Narrative  . Not on file    Review of Systems: See HPI, otherwise negative ROS  Physical Exam: BP (!) 142/97   Pulse 98   Temp 98.3 F (36.8 C) (Oral)   Resp 18   Ht 5\' 10"  (1.778 m)   Wt 178 lb (80.7 kg)   SpO2 96%   BMI 25.54 kg/m  General:   Alert,  Well-developed, well-nourished, pleasant and cooperative in NAD Heart:  Regular rate and rhythm; no murmurs, clicks, rubs,  or gallops. Abdomen:  Soft, nontender and nondistended. No masses, hepatosplenomegaly or hernias noted. Normal bowel sounds, without guarding, and without rebound.   Msk:  Symmetrical without gross deformities. Normal posture.  Impression/Plan: Stephen Sullivan is now here to undergo a screening colonoscopy.  First-ever average risk screening examination.  Risks, benefits, limitations, imponderables and alternatives regarding colonoscopy have been reviewed with the patient. Questions have been answered. All parties agreeable.     Notice:  This dictation was prepared with Dragon dictation along with smaller phrase technology. Any transcriptional errors that result from this process are unintentional and may not be corrected upon review.

## 2017-11-15 NOTE — Op Note (Signed)
[facility] Patient Name: Stephen Sullivan Procedure Date: 11/15/2017 11:32 AM MRN: [id number] Date of Birth: [birth year] Attending MD: Norvel Richards , MD CSN: [id number] Age: 62 Admit Type: Outpatient Procedure:                Colonoscopy Indications:              Screening for colorectal malignant neoplasm Providers:                Norvel Richards, MD, Janeece Riggers, RN, Aram Candela Referring MD:              Medicines:                Propofol per Anesthesia Complications:            No immediate complications. Estimated Blood Loss:      Procedure:                After obtaining informed consent, the colonoscope                            was passed under direct vision. Throughout the                            procedure, the patient's blood pressure, pulse, and                            oxygen saturations were monitored continuously. The                            EC-[id number] ([id number]) scope was introduced through                            the and advanced to the the cecum, identified by                            appendiceal orifice and ileocecal valve. Scope In: 12:36:53 PM Scope Out: 12:53:04 PM Scope Withdrawal Time: 0 hours 6 minutes 46 seconds  Total Procedure Duration: 0 hours 16 minutes 11 seconds  Findings:      These polyps were removed with a cold snare. Resection and retrieval       were complete. Estimated blood loss was minimal.      Three semi-pedunculated polyps were found in the splenic flexure and       cecum. The polyps were 4 to 6 mm in size.      Non-bleeding internal hemorrhoids were found during retroflexion. The       hemorrhoids were mild and small.      The exam was otherwise without abnormality on direct and retroflexion       views. Impression:               - Multiple polyps at the splenic flexure, removed                            with a cold snare. Resected and retrieved.                           -  Three 4 to 6 mm polyps at the splenic flexure and                            in the cecum.                           - Non-bleeding internal hemorrhoids.                           - The examination was otherwise normal on direct                            and retroflexion views. Moderate Sedation:      Moderate (conscious) sedation was personally administered by an       anesthesia professional. The following parameters were monitored: oxygen       saturation, heart rate, blood pressure, respiratory rate, EKG, adequacy       of pulmonary ventilation, and response to care. Total physician       intraservice time was 21 minutes. Recommendation:           - Patient has a contact number available for                            emergencies. The signs and symptoms of potential                            delayed complications were discussed with the                            patient. Return to normal activities tomorrow.                            Written discharge instructions were provided to the                            patient.                           - Resume previous diet.                           - Continue present medications.                           - Repeat colonoscopy date to be determined after                            pending pathology results are reviewed for                            surveillance based on pathology results.                           - Return to GI office (date not yet determined). Procedure Code(s):        --- Professional ---  45385, Colonoscopy, flexible; with removal of                            tumor(s), polyp(s), or other lesion(s) by snare                            technique Diagnosis Code(s):        --- Professional ---                           Z12.11, Encounter for screening for malignant                            neoplasm of colon                           D12.3, Benign neoplasm of transverse colon (hepatic                             flexure or splenic flexure)                           D12.0, Benign neoplasm of cecum                           K64.8, Other hemorrhoids CPT copyright 2017 [employer]. All rights reserved. The codes documented in this report are preliminary and upon coder review may  be revised to meet current compliance requirements. [name], MD Norvel Richards, MD 11/15/2017 1:01:39 PM This report has been signed electronically. Number of Addenda: 0

## 2017-11-15 NOTE — Discharge Instructions (Signed)
°Colonoscopy °Discharge Instructions ° °Read the instructions outlined below and refer to this sheet in the next few weeks. These discharge instructions provide you with general information on caring for yourself after you leave the hospital. Your doctor may also give you specific instructions. While your treatment has been planned according to the most current medical practices available, unavoidable complications occasionally occur. If you have any problems or questions after discharge, call Dr. Rourk at [phone]. °ACTIVITY °· You may resume your regular activity, but move at a slower pace for the next 24 hours.  °· Take frequent rest periods for the next 24 hours.  °· Walking will help get rid of the air and reduce the bloated feeling in your belly (abdomen).  °· No driving for 24 hours (because of the medicine (anesthesia) used during the test).   °· Do not sign any important legal documents or operate any machinery for 24 hours (because of the anesthesia used during the test).  °NUTRITION °· Drink plenty of fluids.  °· You may resume your normal diet as instructed by your doctor.  °· Begin with a light meal and progress to your normal diet. Heavy or fried foods are harder to digest and may make you feel sick to your stomach (nauseated).  °· Avoid alcoholic beverages for 24 hours or as instructed.  °MEDICATIONS °· You may resume your normal medications unless your doctor tells you otherwise.  °WHAT YOU CAN EXPECT TODAY °· Some feelings of bloating in the abdomen.  °· Passage of more gas than usual.  °· Spotting of blood in your stool or on the toilet paper.  °IF YOU HAD POLYPS REMOVED DURING THE COLONOSCOPY: °· No aspirin products for 7 days or as instructed.  °· No alcohol for 7 days or as instructed.  °· Eat a soft diet for the next 24 hours.  °FINDING OUT THE RESULTS OF YOUR TEST °Not all test results are available during your visit. If your test results are not back during the visit, make an appointment  with your caregiver to find out the results. Do not assume everything is normal if you have not heard from your caregiver or the medical facility. It is important for you to follow up on all of your test results.  °SEEK IMMEDIATE MEDICAL ATTENTION IF: °· You have more than a spotting of blood in your stool.  °· Your belly is swollen (abdominal distention).  °· You are nauseated or vomiting.  °· You have a temperature over 101.  °· You have abdominal pain or discomfort that is severe or gets worse throughout the day.  ° ° °Colon polyp information provided ° ° °Further recommendations to follow pending review of pathology report ° ° °Colon Polyps °Polyps are tissue growths inside the body. Polyps can grow in many places, including the large intestine (colon). A polyp may be a round bump or a mushroom-shaped growth. You could have one polyp or several. °Most colon polyps are noncancerous (benign). However, some colon polyps can become cancerous over time. °What are the causes? °The exact cause of colon polyps is not known. °What increases the risk? °This condition is more likely to develop in people who: °· Have a family history of colon cancer or colon polyps. °· Are older than 50 or older than 45 if they are African American. °· Have inflammatory bowel disease, such as ulcerative colitis or Crohn disease. °· Are overweight. °· Smoke cigarettes. °· Do not get enough exercise. °· Drink too much   Eat a diet that is: ? High in fat and red meat. ? Low in fiber.  Had childhood cancer that was treated with abdominal radiation.  What are the signs or symptoms? Most polyps do not cause symptoms. If you have symptoms, they may include:  Blood coming from your rectum when having a bowel movement.  Blood in your stool.The stool may look dark red or black.  A change in bowel habits, such as constipation or diarrhea.  How is this diagnosed? This condition is diagnosed with a colonoscopy. This is a  procedure that uses a lighted, flexible scope to look at the inside of your colon. How is this treated? Treatment for this condition involves removing any polyps that are found. Those polyps will then be tested for cancer. If cancer is found, your health care provider will talk to you about options for colon cancer treatment. Follow these instructions at home: Diet  Eat plenty of fiber, such as fruits, vegetables, and whole grains.  Eat foods that are high in calcium and vitamin D, such as milk, cheese, yogurt, eggs, liver, fish, and broccoli.  Limit foods high in fat, red meats, and processed meats, such as hot dogs, sausage, bacon, and lunch meats.  Maintain a healthy weight, or lose weight if recommended by your health care provider. General instructions  Do not smoke cigarettes.  Do not drink alcohol excessively.  Keep all follow-up visits as told by your health care provider. This is important. This includes keeping regularly scheduled colonoscopies. Talk to your health care provider about when you need a colonoscopy.  Exercise every day or as told by your health care provider. Contact a health care provider if:  You have new or worsening bleeding during a bowel movement.  You have new or increased blood in your stool.  You have a change in bowel habits.  You unexpectedly lose weight. This information is not intended to replace advice given to you by your health care provider. Make sure you discuss any questions you have with your health care provider.   Monitored Anesthesia Care Anesthesia is a term that refers to techniques, procedures, and medicines that help a person stay safe and comfortable during a medical procedure. Monitored anesthesia care, or sedation, is one type of anesthesia. Your anesthesia specialist may recommend sedation if you will be having a procedure that does not require you to be unconscious, such as:  Cataract surgery.  A dental procedure.  A  biopsy.  A colonoscopy.  During the procedure, you may receive a medicine to help you relax (sedative). There are three levels of sedation:  Mild sedation. At this level, you may feel awake and relaxed. You will be able to follow directions.  Moderate sedation. At this level, you will be sleepy. You may not remember the procedure.  Deep sedation. At this level, you will be asleep. You will not remember the procedure.  The more medicine you are given, the deeper your level of sedation will be. Depending on how you respond to the procedure, the anesthesia specialist may change your level of sedation or the type of anesthesia to fit your needs. An anesthesia specialist will monitor you closely during the procedure. Let your health care provider know about:  Any allergies you have.  All medicines you are taking, including vitamins, herbs, eye drops, creams, and over-the-counter medicines.  Any use of steroids (by mouth or as a cream).  Any problems you or family members have had with sedatives and  anesthetic medicines.  Any blood disorders you have.  Any surgeries you have had.  Any medical conditions you have, such as sleep apnea.  Whether you are pregnant or may be pregnant.  Any use of cigarettes, alcohol, or street drugs. What are the risks? Generally, this is a safe procedure. However, problems may occur, including:  Getting too much medicine (oversedation).  Nausea.  Allergic reaction to medicines.  Trouble breathing. If this happens, a breathing tube may be used to help with breathing. It will be removed when you are awake and breathing on your own.  Heart trouble.  Lung trouble.  Before the procedure Staying hydrated Follow instructions from your health care provider about hydration, which may include:  Up to 2 hours before the procedure - you may continue to drink clear liquids, such as water, clear fruit juice, black coffee, and plain tea.  Eating and  drinking restrictions Follow instructions from your health care provider about eating and drinking, which may include:  8 hours before the procedure - stop eating heavy meals or foods such as meat, fried foods, or fatty foods.  6 hours before the procedure - stop eating light meals or foods, such as toast or cereal.  6 hours before the procedure - stop drinking milk or drinks that contain milk.  2 hours before the procedure - stop drinking clear liquids.  Medicines Ask your health care provider about:  Changing or stopping your regular medicines. This is especially important if you are taking diabetes medicines or blood thinners.  Taking medicines such as aspirin and ibuprofen. These medicines can thin your blood. Do not take these medicines before your procedure if your health care provider instructs you not to.  Tests and exams  You will have a physical exam.  You may have blood tests done to show: ? How well your kidneys and liver are working. ? How well your blood can clot.  General instructions  Plan to have someone take you home from the hospital or clinic.  If you will be going home right after the procedure, plan to have someone with you for 24 hours.  What happens during the procedure?  Your blood pressure, heart rate, breathing, level of pain and overall condition will be monitored.  An IV tube will be inserted into one of your veins.  Your anesthesia specialist will give you medicines as needed to keep you comfortable during the procedure. This may mean changing the level of sedation.  The procedure will be performed. After the procedure  Your blood pressure, heart rate, breathing rate, and blood oxygen level will be monitored until the medicines you were given have worn off.  Do not drive for 24 hours if you received a sedative.  You may: ? Feel sleepy, clumsy, or nauseous. ? Feel forgetful about what happened after the procedure. ? Have a sore throat if  you had a breathing tube during the procedure. ? Vomit. This information is not intended to replace advice given to you by your health care provider. Make sure you discuss any questions you have with your health care provider. Document Released: 04/19/2005 Document Revised: 12/31/2015 Document Reviewed: 11/14/2015 Elsevier Interactive Patient Education  [employer].

## 2017-11-15 NOTE — Anesthesia Preprocedure Evaluation (Signed)
Anesthesia Evaluation  Patient identified by MRN, date of birth, ID band Patient awake    Reviewed: Allergy & Precautions, H&P , NPO status , Patient's Chart, lab work & pertinent test results  Airway Mallampati: II  TM Distance: >3 FB Neck ROM: full    Dental no notable dental hx. (+) Teeth Intact, Chipped   Pulmonary neg pulmonary ROS, shortness of breath and with exertion, Current Smoker,    Pulmonary exam normal breath sounds clear to auscultation       Cardiovascular Exercise Tolerance: Good hypertension, On Medications negative cardio ROS   Rhythm:regular Rate:Normal     Neuro/Psych  Headaches, PSYCHIATRIC DISORDERS Anxiety negative neurological ROS  negative psych ROS   GI/Hepatic negative GI ROS, Neg liver ROS, PUD, Bowel prep,GERD  ,  Endo/Other  negative endocrine ROS  Renal/GU negative Renal ROS  negative genitourinary   Musculoskeletal   Abdominal   Peds  Hematology negative hematology ROS (+)   Anesthesia Other Findings   Reproductive/Obstetrics negative OB ROS                             Anesthesia Physical Anesthesia Plan  ASA: II  Anesthesia Plan: MAC   Post-op Pain Management:    Induction:   PONV Risk Score and Plan:   Airway Management Planned:   Additional Equipment:   Intra-op Plan:   Post-operative Plan:   Informed Consent: I have reviewed the patients History and Physical, chart, labs and discussed the procedure including the risks, benefits and alternatives for the proposed anesthesia with the patient or authorized representative who has indicated his/her understanding and acceptance.   Dental Advisory Given  Plan Discussed with: CRNA  Anesthesia Plan Comments:         Anesthesia Quick Evaluation

## 2017-11-15 NOTE — Anesthesia Postprocedure Evaluation (Signed)
Anesthesia Post Note  Patient: Stephen Sullivan  Procedure(s) Performed: COLONOSCOPY WITH PROPOFOL (N/A ) POLYPECTOMY  Patient location during evaluation: PACU Anesthesia Type: MAC Level of consciousness: awake and alert and oriented Pain management: pain level controlled Vital Signs Assessment: post-procedure vital signs reviewed and stable Respiratory status: spontaneous breathing, nonlabored ventilation and respiratory function stable Cardiovascular status: stable Postop Assessment: no apparent nausea or vomiting Anesthetic complications: no     Last Vitals:  Vitals:   11/15/17 1155 11/15/17 1200  BP: (!) 142/97 (!) 144/91  Pulse:    Resp: 18 19  Temp:    SpO2: 96% 96%    Last Pain:  Vitals:   11/15/17 1118  TempSrc: Oral  PainSc: 0-No pain                 Ashrith Sagan

## 2017-11-15 NOTE — Transfer of Care (Signed)
Immediate Anesthesia Transfer of Care Note  Patient: Stephen Sullivan  Procedure(s) Performed: COLONOSCOPY WITH PROPOFOL (N/A ) POLYPECTOMY  Patient Location: PACU  Anesthesia Type:MAC  Level of Consciousness: awake, alert  and oriented  Airway & Oxygen Therapy: Patient Spontanous Breathing and Patient connected to nasal cannula oxygen  Post-op Assessment: Report given to RN and Post -op Vital signs reviewed and stable  Post vital signs: Reviewed and stable  Last Vitals:  Vitals Value Taken Time  BP    Temp    Pulse    Resp    SpO2      Last Pain:  Vitals:   11/15/17 1118  TempSrc: Oral  PainSc: 0-No pain      Patients Stated Pain Goal: 7 (63/89/37 [id number])  Complications: No apparent anesthesia complications

## 2017-11-17 ENCOUNTER — Encounter: Payer: Self-pay | Admitting: Internal Medicine

## 2017-11-21 ENCOUNTER — Encounter (HOSPITAL_COMMUNITY): Payer: Self-pay | Admitting: Internal Medicine

## 2018-01-15 ENCOUNTER — Ambulatory Visit (INDEPENDENT_AMBULATORY_CARE_PROVIDER_SITE_OTHER): Payer: Medicare Other | Admitting: Nurse Practitioner

## 2018-01-15 ENCOUNTER — Encounter: Payer: Self-pay | Admitting: Nurse Practitioner

## 2018-01-15 VITALS — BP 155/90 | HR 71 | Temp 97.2°F | Ht 70.0 in | Wt 173.2 lb

## 2018-01-15 DIAGNOSIS — K219 Gastro-esophageal reflux disease without esophagitis: Secondary | ICD-10-CM

## 2018-01-15 DIAGNOSIS — K5900 Constipation, unspecified: Secondary | ICD-10-CM

## 2018-01-15 NOTE — Assessment & Plan Note (Signed)
GERD currently doing well.  Recommend continue daily PPI.  He is asked if he take an additional dose on days that he has a dietary indiscretion and breakthrough symptoms.  I feel this is appropriate.  Should be the rare occasion, however.  Follow-up in 6 months for long-term symptom progression.

## 2018-01-15 NOTE — Progress Notes (Signed)
CC'D TO PCP °

## 2018-01-15 NOTE — Progress Notes (Signed)
Referring Provider: Celedonio Savage, MD Primary Care Physician:  Octavio Graves, DO Primary GI:  Dr. Gala Romney  Chief Complaint  Patient presents with  . Gastroesophageal Reflux    doing ok    HPI:   Stephen Sullivan is a 62 y.o. male who presents for follow-up on GERD and constipation.  The patient was last seen in our office 10/15/2017 for the same.  At that time he noted worsening constipation over the previous 2 months, bowel movement twice a day mostly daily but require straining and has initial hard stools.  Does not drink much water.  Eats a lot of cheese.  Only one serving of fruits or vegetables a day.  GERD doing okay overall, better on twice a day PPI.  No hematochezia or melena.  Breakthrough GERD symptoms about once every 2 weeks.  No other ongoing GI concerns.  Has never had a colonoscopy.  Recommended continue twice daily PPI, cut back on the amount of milk and cheese and increase amount of water and fibrous foods.  Recommended starting a fiber supplement daily, MiraLAX 1-2 times a day as needed, colonoscopy, follow-up in 3 months.  Colonoscopy was completed 11/15/2017 which found multiple polyps at the splenic flexure, three 4 to 6 mm polyps at the splenic flexure and cecum, nonbleeding internal hemorrhoids.  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  Recommended repeat colonoscopy in 3 years.  Today he states he's doing better. Constipation improved other than single episode a few weeks ago. He has increased his water intake, increased fiber intake. Not on daily fiber. Hasn't needed MiraLAX since his last visit. GERD doing well currently. Is currently on daily PPI. Will start taking a second dose a day on the rare occasion that he eats a trigger food. Denies abdominal pain, N/V, hematochezia, melena, fever, chills, unintentional weight loss.   His brother is near end of life; alcoholic, cirrhosis and gastric cancer. He has been given 6 months to 2 years. Still  drinking.  Past Medical History:  Diagnosis Date  . Arthritis   . Back pain   . Cataracts, bilateral    immature  . GERD (gastroesophageal reflux disease)    using Baking Soda and Water per pt  . Headache(784.0)    related to neck issues  . History of bronchitis   . History of gastric ulcer 20+yrs ago  . Hypertension    takes Benazepril some days  . Joint pain   . Muscle spasm    takes Flexeril daily  . Shortness of breath    with exertion  . Weakness    and numbness in left fingers    Past Surgical History:  Procedure Laterality Date  . ANTERIOR CERVICAL DECOMP/DISCECTOMY FUSION N/A 12/18/2013   Procedure: ANTERIOR CERVICAL DECOMPRESSION/DISCECTOMY FUSION CERVICAL THREE-FOUR, FOUR-FIVE, FIVE-SIX W/ BONEGRAFT;  Surgeon: Hosie Spangle, MD;  Location: [facility];  Service: Neurosurgery;  Laterality: N/A;  ANTERIOR CERVICAL DECOMPRESSION/DISCECTOMY FUSION CERVICAL THREE-FOUR, FOUR-FIVE, FIVE-SIX W/ BONEGRAFT  . arm surgery Left   . COLONOSCOPY WITH PROPOFOL N/A 11/15/2017   Procedure: COLONOSCOPY WITH PROPOFOL;  Surgeon: Daneil Dolin, MD;  Location: [facility];  Service: Endoscopy;  Laterality: N/A;  12:30pm  . ESOPHAGOGASTRODUODENOSCOPY (EGD) WITH PROPOFOL N/A 10/05/2016   Procedure: ESOPHAGOGASTRODUODENOSCOPY (EGD) WITH PROPOFOL;  Surgeon: Daneil Dolin, MD;  Location: [facility];  Service: Endoscopy;  Laterality: N/A;  . ESOPHAGOGASTRODUODENOSCOPY (EGD) WITH PROPOFOL N/A 02/15/2017   Procedure: ESOPHAGOGASTRODUODENOSCOPY (EGD) WITH PROPOFOL;  Surgeon: Daneil Dolin, MD;  Location: [facility];  Service: Endoscopy;  Laterality: N/A;  1115  . INGUINAL HERNIA REPAIR Right 08/18/2016   Procedure: RIGHT INGUINAL HERNORRHAPHY WITH MESH;  Surgeon: Aviva Signs, MD;  Location: [facility];  Service: General;  Laterality: Right;  . JOINT REPLACEMENT Right    hip   . POLYPECTOMY  11/15/2017   Procedure: POLYPECTOMY;  Surgeon: Daneil Dolin, MD;  Location: [facility];   Service: Endoscopy;;  polyp cs splenic flexure times 2,  cecal polyp  . SHOULDER SURGERY Right    x 3     Current Outpatient Medications  Medication Sig Dispense Refill  . amLODipine (NORVASC) 10 MG tablet Take 10 mg by mouth daily.    . pantoprazole (PROTONIX) 40 MG tablet Take 40 mg by mouth daily.     No current facility-administered medications for this visit.     Allergies as of 01/15/2018 - Review Complete 01/15/2018  Allergen Reaction Noted  . Nsaids Other (See Comments) 11/02/2016    Family History  Problem Relation Age of Onset  . Prostate cancer Father   . Prostate cancer Brother   . Colon cancer Neg Hx     Social History   Socioeconomic History  . Marital status: Single    Spouse name: Not on file  . Number of children: Not on file  . Years of education: Not on file  . Highest education level: Not on file  Occupational History  . Occupation: disabled  Social Needs  . Financial resource strain: Not on file  . Food insecurity:    Worry: Not on file    Inability: Not on file  . Transportation needs:    Medical: Not on file    Non-medical: Not on file  Tobacco Use  . Smoking status: Current Some Day Smoker    Packs/day: 0.10    Years: 47.00    Pack years: 4.70    Types: Cigarettes  . Smokeless tobacco: Never Used  Substance and Sexual Activity  . Alcohol use: No  . Drug use: Yes    Types: Marijuana, Cocaine    Comment: "takes puff off joint every now and then"; done cocaine and marijuana 3 weeks ago  . Sexual activity: Not Currently  Lifestyle  . Physical activity:    Days per week: Not on file    Minutes per session: Not on file  . Stress: Not on file  Relationships  . Social connections:    Talks on phone: Not on file    Gets together: Not on file    Attends religious service: Not on file    Active member of club or organization: Not on file    Attends meetings of clubs or organizations: Not on file    Relationship status: Not on file    Other Topics Concern  . Not on file  Social History Narrative  . Not on file    Review of Systems: General: Negative for anorexia, weight loss, fever, chills, fatigue, weakness. ENT: Negative for hoarseness, difficulty swallowing , nasal congestion. CV: Negative for chest pain, angina, palpitations, peripheral edema.  Respiratory: Negative for dyspnea at rest, cough, sputum, wheezing.  GI: See history of present illness. Endo: Negative for unusual weight change.  Heme: Negative for bruising or bleeding.   Physical Exam: BP (!) 155/90   Pulse 71   Temp (!) 97.2 F (36.2 C) (Oral)   Ht 5\' 10"  (1.778 m)   Wt 173 lb  3.2 oz (78.6 kg)   BMI 24.85 kg/m  General:   Alert and oriented. Pleasant and cooperative. Well-nourished and well-developed.  Eyes:  Without icterus, sclera clear and conjunctiva pink.  Ears:  Normal auditory acuity. Cardiovascular:  S1, S2 present without murmurs appreciated. Normal pulses noted. Extremities without clubbing or edema. Respiratory:  Clear to auscultation bilaterally. No wheezes, rales, or rhonchi. No distress.  Gastrointestinal:  +BS, soft, non-tender and non-distended. No HSM noted. No guarding or rebound. No masses appreciated.  Rectal:  Deferred  Musculoskalatal:  Symmetrical without gross deformities. Neurologic:  Alert and oriented x4;  grossly normal neurologically. Psych:  Alert and cooperative. Normal mood and affect. Heme/Lymph/Immune: No excessive bruising noted.    01/15/2018 10:52 AM   Disclaimer: This note was dictated with voice recognition software. Similar sounding words can inadvertently be transcribed and may not be corrected upon review.

## 2018-01-15 NOTE — Assessment & Plan Note (Signed)
Overall, constipation doing well with increased water and fibrous foods.  His sister pushes him to drink more water.  He has not needed MiraLAX.  Recommend he keep MiraLAX as an option that he can take 1-2 times a day if needed for worsening breakthrough constipation.  Continue excellent efforts at increased fiber intake and water.  Follow-up in 6 months for long-term symptom progression.

## 2018-01-15 NOTE — Patient Instructions (Signed)
1. Continue your great efforts to increase the amount of water you drink in the amount of fiber foods (fruits, vegetables, oatmeal, etc.) he states you eat every day. 2. Continue your acid blocker. 3. If you need to take a second dose on the rare occasion due to breakthrough symptoms from cucumbers or pizza, that is okay. 4. You can still use MiraLAX if needed for days that you may have worsening constipation. 5. Return for follow-up in 6 months. 6. Call us if you have any questions or concerns.  At [facility] we value your feedback. You may receive a survey about your visit today. Please share your experience as we strive to create trusting relationships with our patients to provide genuine, compassionate, quality care.  It was great to see you today!  I hope you have a great summer!!

## 2018-02-03 ENCOUNTER — Other Ambulatory Visit: Payer: Self-pay | Admitting: Nurse Practitioner

## 2018-07-17 ENCOUNTER — Ambulatory Visit: Payer: Medicare Other | Admitting: Nurse Practitioner

## 2018-09-19 ENCOUNTER — Other Ambulatory Visit: Payer: Self-pay

## 2018-09-19 DIAGNOSIS — I70213 Atherosclerosis of native arteries of extremities with intermittent claudication, bilateral legs: Secondary | ICD-10-CM

## 2018-09-19 DIAGNOSIS — I739 Peripheral vascular disease, unspecified: Secondary | ICD-10-CM

## 2018-09-19 NOTE — Progress Notes (Signed)
HISTORY AND PHYSICAL     CC:  follow up. Requesting Provider:  Octavio Graves, DO  HPI: This is a 63 y.o. male who is here today for follow up.  He was seen by Dr. Donzetta Matters in October 2018 and at that time, pt was having bilateral thigh pain with walking about 1 block and relieved with rest.  He did not have rest pain or tissue loss.  He did not have any cramping in his calves.  He was smoking at that time.  His ABI's were 1 with triphasic waveforms and DP pulses were palpable as well as his femoral pulses.  It was recommended he take a daily baby aspirin and start a walking program and f/u in one year.   The pt returns today for follow up with ABI's.  He states that he had been getting along just fine but walked about 0.5 mile to the store and got severe cramping in the left calf.  He states this did not get better with rest and he could not proceed and had to get someone to come get him.  He states it has not happened since.  He does not have any non healing wounds on his feet.  He says he is on his feet most of the day and does not have any troubles with this.   He states that about a week ago, he had ~ 3 episodes of chest pain that lasted about 5-7 seconds a piece.  Nothing made it better or worse.  He states he felt like he had fluttering in his chest.  It has not happened since.  He does not have a cardiologist.  He does have a hx of GERD/GIB, but he states this did not feel like that.   He continues to smoke about a pack every 3 days.  He states that he doesn't think he can quit now as he has had 2 deaths in his family in the past 6 months and about to have another one.    The pt is not on a statin for cholesterol management.   (Rx given this visit) The pt does not have diabetes. The pt is on CCB for hypertension.  The pt is not on an aspirin.  (hx of stomach ulcers) Tobacco hx:  current Other AC:  none  Past Medical History:  Diagnosis Date  . Arthritis   . Back pain   . Cataracts,  bilateral    immature  . GERD (gastroesophageal reflux disease)    using Baking Soda and Water per pt  . Headache(784.0)    related to neck issues  . History of bronchitis   . History of gastric ulcer 20+yrs ago  . Hypertension    takes Benazepril some days  . Joint pain   . Muscle spasm    takes Flexeril daily  . Shortness of breath    with exertion  . Weakness    and numbness in left fingers    Past Surgical History:  Procedure Laterality Date  . ANTERIOR CERVICAL DECOMP/DISCECTOMY FUSION N/A 12/18/2013   Procedure: ANTERIOR CERVICAL DECOMPRESSION/DISCECTOMY FUSION CERVICAL THREE-FOUR, FOUR-FIVE, FIVE-SIX W/ BONEGRAFT;  Surgeon: Hosie Spangle, MD;  Location: [place] [facility];  Service: Neurosurgery;  Laterality: N/A;  ANTERIOR CERVICAL DECOMPRESSION/DISCECTOMY FUSION CERVICAL THREE-FOUR, FOUR-FIVE, FIVE-SIX W/ BONEGRAFT  . arm surgery Left   . COLONOSCOPY WITH PROPOFOL N/A 11/15/2017   Procedure: COLONOSCOPY WITH PROPOFOL;  Surgeon: Daneil Dolin, MD;  [facility]: [facility];  Service:  Endoscopy;  Laterality: N/A;  12:30pm  . ESOPHAGOGASTRODUODENOSCOPY (EGD) WITH PROPOFOL N/A 10/05/2016   Procedure: ESOPHAGOGASTRODUODENOSCOPY (EGD) WITH PROPOFOL;  Surgeon: Daneil Dolin, MD;  [facility];  Service: Endoscopy;  Laterality: N/A;  . ESOPHAGOGASTRODUODENOSCOPY (EGD) WITH PROPOFOL N/A 02/15/2017   Procedure: ESOPHAGOGASTRODUODENOSCOPY (EGD) WITH PROPOFOL;  Surgeon: Daneil Dolin, MD;  [facility];  Service: Endoscopy;  Laterality: N/A;  1115  . INGUINAL HERNIA REPAIR Right 08/18/2016   Procedure: RIGHT INGUINAL HERNORRHAPHY WITH MESH;  Surgeon: Aviva Signs, MD;  Location: [facility];  Service: General;  Laterality: Right;  . JOINT REPLACEMENT Right    hip   . POLYPECTOMY  11/15/2017   Procedure: POLYPECTOMY;  Surgeon: Daneil Dolin, MD;  [facility]: [facility];  Service: Endoscopy;;  polyp cs splenic flexure times 2,  cecal polyp  . SHOULDER SURGERY Right    x  3     Allergies  Allergen Reactions  . Nsaids Other (See Comments)    Not to take - "busted intestines open"    Current Outpatient Medications  Medication Sig Dispense Refill  . amLODipine (NORVASC) 10 MG tablet Take 10 mg by mouth daily.    . pantoprazole (PROTONIX) 40 MG tablet TAKE 1 TABLET BY MOUTH 2 TIMES DAILY BEFORE A MEAL 180 tablet 1   No current facility-administered medications for this visit.     Family History  Problem Relation Age of Onset  . Prostate cancer Father   . Prostate cancer Brother   . Colon cancer Neg Hx     Social History   Socioeconomic History  . Marital status: Single    Spouse name: Not on file  . Number of children: Not on file  . Years of education: Not on file  . Highest education level: Not on file  Occupational History  . Occupation: disabled  Social Needs  . Financial resource strain: Not on file  . Food insecurity:    Worry: Not on file    Inability: Not on file  . Transportation needs:    Medical: Not on file    Non-medical: Not on file  Tobacco Use  . Smoking status: Current Some Day Smoker    Packs/day: 0.10    Years: 47.00    Pack years: 4.70    Types: Cigarettes  . Smokeless tobacco: Never Used  Substance and Sexual Activity  . Alcohol use: No  . Drug use: Yes    Types: Marijuana, Cocaine    Comment: "takes puff off joint every now and then"; done cocaine and marijuana 3 weeks ago  . Sexual activity: Not Currently  Lifestyle  . Physical activity:    Days per week: Not on file    Minutes per session: Not on file  . Stress: Not on file  Relationships  . Social connections:    Talks on phone: Not on file    Gets together: Not on file    Attends religious service: Not on file    Active member of club or organization: Not on file    Attends meetings of clubs or organizations: Not on file    Relationship status: Not on file  . Intimate partner violence:    Fear of current or ex partner: Not on file     Emotionally abused: Not on file    Physically abused: Not on file    Forced sexual activity: Not on file  Other Topics Concern  . Not on file  Social History  Narrative  . Not on file     REVIEW OF SYSTEMS:   [X]  denotes positive finding, [ ]  denotes negative finding Cardiac  Comments:  Chest pain or chest pressure: x See HPI  Shortness of breath upon exertion: x   Short of breath when lying flat: x   Irregular heart rhythm: x       Vascular    Pain in calf, thigh, or hip brought on by ambulation: x   Pain in feet at night that wakes you up from your sleep:  x   Blood clot in your veins:    Leg swelling:         Pulmonary    Oxygen at home:    Productive cough:     Wheezing:         Neurologic    Sudden weakness in arms or legs:  x See HPI  Sudden numbness in arms or legs:     Sudden onset of difficulty speaking or slurred speech:    Temporary loss of vision in one eye:     Problems with dizziness:         Gastrointestinal    Blood in stool:     Vomited blood:         Genitourinary    Burning when urinating:     Blood in urine:        Psychiatric    Major depression:         Hematologic    Bleeding problems:    Problems with blood clotting too easily:        Skin    Rashes or ulcers: x       Constitutional    Fever or chills:      PHYSICAL EXAMINATION:  Today's Vitals   09/23/18 0856  BP: (!) 147/76  Pulse: (!) 59  Resp: 14  Temp: 97.9 F (36.6 C)  TempSrc: Oral  SpO2: 99%  Weight: 166 lb 3.6 oz (75.4 kg)  Height: 5\' 10"  (1.778 m)   Body mass index is 23.85 kg/m.   General:  WDWN in NAD; vital signs documented above Gait: Not observed HENT: WNL, normocephalic Pulmonary: normal non-labored breathing , without Rales, rhonchi,  wheezing Cardiac: regular HR, without  Murmurs; without carotid bruits Abdomen: soft, NT, no masses Skin: without rashes Vascular Exam/Pulses:  Right Left  Radial 2+ (normal) 2+ (normal)  Ulnar Unable to  palpate  Unable to palpate   Femoral 2+ (normal) 2+ (normal)  Popliteal Unable to palpate  Unable to palpate   DP 2+ (normal) 2+ (normal)  PT Unable to palpate  Unable to palpate    Extremities: without ischemic changes, without Gangrene , without cellulitis; without open wounds;  Musculoskeletal: no muscle wasting or atrophy  Neurologic: A&O X 3;  No focal weakness or paresthesias are detected Psychiatric:  The pt has Normal affect.   Non-Invasive Vascular Imaging:   ABI's/TBI's on 09/23/2018: Right:  0.79/0.4  AT (M)  PT (M) Left:  0.99/0.95  AT (B)  PT (B)  Previous ABI's/TBI's on 05/25/17: Right:  0.99 (T) Left:  1.03 (T)    ASSESSMENT/PLAN:: 63 y.o. male here for follow up for PAD  -pt had episode of calf pain in the left leg on one occasion after walking about 0.5 mile that was not relieved with rest and has not happened since.  His ABI on the right is decreased from previous exam, however he continues to have palpable DP pulses bilaterally as  well as palpable femoral pulses.  Will have him f/u in 6 months with repeat ABI's.  He will call sooner should he have any issues or non healing wounds.  I did prescribe him a low dose statin today.  Advised him if he develops any muscle weakness, to stop taking the medication.  -recent chest pain:  Pt had 3 episodes of chest pain that lasted about 5-7 sec each about a week ago.  He has not had any further chest pain and fluttering in his chest. Will refer to cardiology and advised him should he develop this again, he needs to go to the ER.  He expressed understanding -discussed smoking cessation importance with pt and he is not ready to quit   Leontine Locket, PA-C [facility] [phone]  Clinic MD:   Trula Slade

## 2018-09-23 ENCOUNTER — Other Ambulatory Visit: Payer: Self-pay

## 2018-09-23 ENCOUNTER — Ambulatory Visit (HOSPITAL_COMMUNITY)
Admission: RE | Admit: 2018-09-23 | Discharge: 2018-09-23 | Disposition: A | Discharge status: Home / Self Care | Payer: Medicare Other | Source: Ambulatory Visit | Attending: Vascular Surgery | Admitting: Vascular Surgery

## 2018-09-23 ENCOUNTER — Ambulatory Visit (INDEPENDENT_AMBULATORY_CARE_PROVIDER_SITE_OTHER): Payer: Medicare Other | Admitting: Physician Assistant

## 2018-09-23 ENCOUNTER — Encounter: Payer: Self-pay | Admitting: Family

## 2018-09-23 VITALS — BP 147/76 | HR 59 | Temp 97.9°F | Resp 14 | Ht 70.0 in | Wt 166.2 lb

## 2018-09-23 DIAGNOSIS — I739 Peripheral vascular disease, unspecified: Secondary | ICD-10-CM | POA: Insufficient documentation

## 2018-09-23 DIAGNOSIS — I70213 Atherosclerosis of native arteries of extremities with intermittent claudication, bilateral legs: Secondary | ICD-10-CM | POA: Diagnosis present

## 2018-09-23 MED ORDER — ATORVASTATIN CALCIUM 10 MG PO TABS: 10.0000 mg | ORAL_TABLET | Freq: Every day | ORAL | 2 refills | Status: DC

## 2018-10-08 ENCOUNTER — Ambulatory Visit (INDEPENDENT_AMBULATORY_CARE_PROVIDER_SITE_OTHER): Payer: Medicare Other | Admitting: Nurse Practitioner

## 2018-10-08 ENCOUNTER — Encounter: Payer: Self-pay | Admitting: Nurse Practitioner

## 2018-10-08 VITALS — BP 159/89 | HR 75 | Temp 97.0°F | Ht 70.0 in | Wt 162.8 lb

## 2018-10-08 DIAGNOSIS — K219 Gastro-esophageal reflux disease without esophagitis: Secondary | ICD-10-CM

## 2018-10-08 DIAGNOSIS — K5900 Constipation, unspecified: Secondary | ICD-10-CM

## 2018-10-08 MED ORDER — PANTOPRAZOLE SODIUM 40 MG PO TBEC: 40.0000 mg | DELAYED_RELEASE_TABLET | Freq: Every day | ORAL | 3 refills | Status: DC

## 2018-10-08 NOTE — Assessment & Plan Note (Signed)
Chronic history of GERD symptoms currently controlled on PPI.  Recommend he continue PPI and follow-up in 1 year.

## 2018-10-08 NOTE — Progress Notes (Signed)
Referring Provider: Octavio Graves, DO Primary Care Physician:  Octavio Graves, DO Primary GI:  Dr. Gala Romney  Chief Complaint  Patient presents with  . Constipation    has RB depending on how much ghe strains  . Gastroesophageal Reflux    f/u. Doing okay    HPI:   Stephen Sullivan is a 63 y.o. male who presents for follow-up on GERD and constipation.  The patient was last seen in our office 01/15/2018 for the same.  Colonoscopy up-to-date for 06/26/2018 with multiple polyps, nonbleeding internal hemorrhoids.  Recommended repeat in 3 years (2022).  At last visit doing better.  Constipation improved with increased water intake and increased fiber intake.  Has not needed MiraLAX recently.  GERD well managed on PPI, will take a second dose for rare breakthrough symptoms.  Recommended continue fiber, acid blocker, MiraLAX as needed.  Follow-up in 6 months.  Today he states he's doing well. GERD much better controlled on PPI. Constipation doing ok overall. Drinks a lot of milk. Still drinking adequate water. Eats what he has on hand (unable to describe if he is eating significant fiber). Not using MiraLAX due to not needing it. Denies hematochezia, melena, fever, chills, N/V. Denies chest pain, dyspnea, dizziness, lightheadedness, syncope, near syncope. Denies any other upper or lower GI symptoms.  Past Medical History:  Diagnosis Date  . Arthritis   . Back pain   . Cataracts, bilateral    immature  . GERD (gastroesophageal reflux disease)    using Baking Soda and Water per pt  . Headache(784.0)    related to neck issues  . History of bronchitis   . History of gastric ulcer 20+yrs ago  . Hypertension    takes Benazepril some days  . Joint pain   . Muscle spasm    takes Flexeril daily  . Shortness of breath    with exertion  . Weakness    and numbness in left fingers    Past Surgical History:  Procedure Laterality Date  . ANTERIOR CERVICAL DECOMP/DISCECTOMY FUSION N/A 12/18/2013    Procedure: ANTERIOR CERVICAL DECOMPRESSION/DISCECTOMY FUSION CERVICAL THREE-FOUR, FOUR-FIVE, FIVE-SIX W/ BONEGRAFT;  Surgeon: Hosie Spangle, MD;  Location: [place] [facility];  Service: Neurosurgery;  Laterality: N/A;  ANTERIOR CERVICAL DECOMPRESSION/DISCECTOMY FUSION CERVICAL THREE-FOUR, FOUR-FIVE, FIVE-SIX W/ BONEGRAFT  . arm surgery Left   . COLONOSCOPY WITH PROPOFOL N/A 11/15/2017   Procedure: COLONOSCOPY WITH PROPOFOL;  Surgeon: Daneil Dolin, MD;  [facility];  Service: Endoscopy;  Laterality: N/A;  12:30pm  . ESOPHAGOGASTRODUODENOSCOPY (EGD) WITH PROPOFOL N/A 10/05/2016   Procedure: ESOPHAGOGASTRODUODENOSCOPY (EGD) WITH PROPOFOL;  Surgeon: Daneil Dolin, MD;  [facility];  Service: Endoscopy;  Laterality: N/A;  . ESOPHAGOGASTRODUODENOSCOPY (EGD) WITH PROPOFOL N/A 02/15/2017   Procedure: ESOPHAGOGASTRODUODENOSCOPY (EGD) WITH PROPOFOL;  Surgeon: Daneil Dolin, MD;  [facility];  Service: Endoscopy;  Laterality: N/A;  1115  . INGUINAL HERNIA REPAIR Right 08/18/2016   Procedure: RIGHT INGUINAL HERNORRHAPHY WITH MESH;  Surgeon: Aviva Signs, MD;  Location: [facility];  Service: General;  Laterality: Right;  . JOINT REPLACEMENT Right    hip   . POLYPECTOMY  11/15/2017   Procedure: POLYPECTOMY;  Surgeon: Daneil Dolin, MD;  [facility];  Service: Endoscopy;;  polyp cs splenic flexure times 2,  cecal polyp  . SHOULDER SURGERY Right    x 3     Current Outpatient Medications  Medication Sig Dispense Refill  . amLODipine (NORVASC)  10 MG tablet Take 10 mg by mouth daily.    Marland Kitchen atorvastatin (LIPITOR) 10 MG tablet Take 1 tablet (10 mg total) by mouth daily. 30 tablet 2  . pantoprazole (PROTONIX) 40 MG tablet TAKE 1 TABLET BY MOUTH 2 TIMES DAILY BEFORE A MEAL (Patient taking differently: Take 40 mg by mouth daily. ) 180 tablet 1   No current facility-administered medications for this visit.     Allergies as of 10/08/2018 - Review Complete 10/08/2018    Allergen Reaction Noted  . Nsaids Other (See Comments) 11/02/2016    Family History  Problem Relation Age of Onset  . Prostate cancer Father   . Prostate cancer Brother   . Colon cancer Neg Hx     Social History   Socioeconomic History  . Marital status: Single    Spouse name: Not on file  . Number of children: Not on file  . Years of education: Not on file  . Highest education level: Not on file  Occupational History  . Occupation: disabled  Social Needs  . Financial resource strain: Not on file  . Food insecurity:    Worry: Not on file    Inability: Not on file  . Transportation needs:    Medical: Not on file    Non-medical: Not on file  Tobacco Use  . Smoking status: Current Some Day Smoker    Packs/day: 0.10    Years: 47.00    Pack years: 4.70    Types: Cigarettes  . Smokeless tobacco: Never Used  Substance and Sexual Activity  . Alcohol use: No  . Drug use: Yes    Types: Marijuana, Cocaine    Comment: As of 10/08/18: "takes puff off joint every now and then"; done cocaine mid-January  . Sexual activity: Not Currently  Lifestyle  . Physical activity:    Days per week: Not on file    Minutes per session: Not on file  . Stress: Not on file  Relationships  . Social connections:    Talks on phone: Not on file    Gets together: Not on file    Attends religious service: Not on file    Active member of club or organization: Not on file    Attends meetings of clubs or organizations: Not on file    Relationship status: Not on file  Other Topics Concern  . Not on file  Social History Narrative  . Not on file    Review of Systems: General: Negative for anorexia, weight loss, fever, chills, fatigue, weakness. ENT: Negative for hoarseness, difficulty swallowing. CV: Negative for chest pain, angina, palpitations, peripheral edema.  Respiratory: Negative for dyspnea at rest, cough, sputum, wheezing.  GI: See history of present illness. Endo: Negative for  unusual weight change.  Heme: Negative for bruising or bleeding. Allergy: Negative for rash or hives.   Physical Exam: BP (!) 159/89   Pulse 75   Temp (!) 97 F (36.1 C) (Oral)   Ht 5\' 10"  (1.778 m)   Wt 162 lb 12.8 oz (73.8 kg)   BMI 23.36 kg/m  General:   Alert and oriented. Pleasant and cooperative. Well-nourished and well-developed.  Eyes:  Without icterus, sclera clear and conjunctiva pink.  Ears:  Normal auditory acuity. Cardiovascular:  S1, S2 present without murmurs appreciated. Extremities without clubbing or edema. Respiratory:  Clear to auscultation bilaterally. No wheezes, rales, or rhonchi. No distress.  Gastrointestinal:  +BS, soft, non-tender and non-distended. No HSM noted. No guarding or rebound. No  masses appreciated.  Rectal:  Deferred  Musculoskalatal:  Symmetrical without gross deformities. Neurologic:  Alert and oriented x4;  grossly normal neurologically. Psych:  Alert and cooperative. Normal mood and affect. Heme/Lymph/Immune: No excessive bruising noted.    10/08/2018 10:09 AM   Disclaimer: This note was dictated with voice recognition software. Similar sounding words can inadvertently be transcribed and may not be corrected upon review.

## 2018-10-08 NOTE — Patient Instructions (Addendum)
Your health issues we discussed today were:   GERD (heartburn): 1. Continue taking your acid blocker once a day.  I have refilled your medicine. 2. You can take a second dose later in the day occasionally, if needed 3. Call us for any severe or worsening symptoms  Constipation: 1. Continue with adequate fiber and water intake 2. You can use MiraLAX as needed for constipation  Overall I recommend:  1. Return for follow-up in 1 year 2. Call if you have any questions or concerns.  At [facility] we value your feedback. You may receive a survey about your visit today. Please share your experience as we strive to create trusting relationships with our patients to provide genuine, compassionate, quality care.  We appreciate your understanding and patience as we review any laboratory studies, imaging, and other diagnostic tests that are ordered as we care for you. Our office policy is 5 business days for review of these results, and any emergent or urgent results are addressed in a timely manner for your best interest. If you do not hear from our office in 1 week, please contact us.   We also encourage the use of MyChart, which contains your medical information for your review as well. If you are not enrolled in this feature, an access code is on this after visit summary for your convenience. Thank you for allowing [place] to be involved in your care.  It was great to see you today!  I hope you have a great day!!

## 2018-10-08 NOTE — Assessment & Plan Note (Signed)
Chronic constipation doing better with appropriate water and fiber intake.  Has MiraLAX to use as needed but has not needed it in some time now.  Continue current bowel regimen.  Follow-up in 1 year.

## 2018-10-08 NOTE — Progress Notes (Signed)
CC'D TO PCP °

## 2018-12-19 ENCOUNTER — Other Ambulatory Visit: Payer: Self-pay | Admitting: Physician Assistant

## 2019-03-24 ENCOUNTER — Ambulatory Visit: Payer: Self-pay | Admitting: Family

## 2019-03-24 ENCOUNTER — Encounter (HOSPITAL_COMMUNITY): Payer: Self-pay

## 2019-03-31 ENCOUNTER — Ambulatory Visit: Payer: Medicare Other | Admitting: Family

## 2019-03-31 ENCOUNTER — Encounter (HOSPITAL_COMMUNITY): Payer: Medicare Other

## 2019-04-16 ENCOUNTER — Other Ambulatory Visit: Payer: Self-pay | Admitting: Vascular Surgery

## 2019-04-27 ENCOUNTER — Other Ambulatory Visit: Payer: Self-pay | Admitting: Vascular Surgery

## 2019-05-17 ENCOUNTER — Other Ambulatory Visit: Payer: Self-pay | Admitting: Vascular Surgery

## 2019-09-18 ENCOUNTER — Encounter: Payer: Self-pay | Admitting: Internal Medicine

## 2019-10-27 ENCOUNTER — Emergency Department (HOSPITAL_COMMUNITY): Payer: Medicare Other

## 2019-10-27 ENCOUNTER — Other Ambulatory Visit: Payer: Self-pay

## 2019-10-27 ENCOUNTER — Emergency Department (HOSPITAL_COMMUNITY)
Admission: EM | Admit: 2019-10-27 | Discharge: 2019-10-27 | Disposition: A | Discharge status: Home / Self Care | Payer: Medicare Other | Attending: Emergency Medicine | Admitting: Emergency Medicine

## 2019-10-27 DIAGNOSIS — F17210 Nicotine dependence, cigarettes, uncomplicated: Secondary | ICD-10-CM | POA: Insufficient documentation

## 2019-10-27 DIAGNOSIS — M25551 Pain in right hip: Secondary | ICD-10-CM | POA: Insufficient documentation

## 2019-10-27 DIAGNOSIS — Z79899 Other long term (current) drug therapy: Secondary | ICD-10-CM | POA: Insufficient documentation

## 2019-10-27 DIAGNOSIS — Z96641 Presence of right artificial hip joint: Secondary | ICD-10-CM | POA: Diagnosis not present

## 2019-10-27 DIAGNOSIS — I10 Essential (primary) hypertension: Secondary | ICD-10-CM | POA: Insufficient documentation

## 2019-10-27 MED ORDER — DICLOFENAC SODIUM 1 % EX GEL: 2.0000 g | Freq: Four times a day (QID) | CUTANEOUS | 0 refills | Status: DC

## 2019-10-27 MED ORDER — MORPHINE SULFATE (PF) 4 MG/ML IV SOLN
4.0000 mg | Freq: Once | INTRAVENOUS | Status: AC
  Administered 2019-10-27: 4 mg via INTRAMUSCULAR
  Filled 2019-10-27: qty 1

## 2019-10-27 NOTE — ED Provider Notes (Signed)
[facility] EMERGENCY DEPARTMENT Provider Note   CSN: [id number] Arrival date & time: 10/27/19  [id number]     History Chief Complaint  Patient presents with  . Hip Pain    Stephen Sullivan is a 64 y.o. male.  Stephen Sullivan is a 30 year old gentleman presenting to the ED with 2 weeks of right hip pain.  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  He reports that he was working in his tool shed roughly 2 weeks ago when his right leg became entangled in cords.  He did not fall or have any significant trauma but did end up putting significant stress on his right hip while pulling on his leg to free it from the cords.  He was able to finish his work that day without significant issue or hip pain.  The following day, he had debilitating right hip pain and remained in bed the entire day.  His pain was sufficient he could not get out of bed and ended up urinating into a trash bin.  His pain seemed to improve for several days until it returned with a vengeance.  For the past week and a half, he has had incredibly painful pulsatile right hip/gluteal pain.  This pain is a throbbing sensation covering his right buttock and the outside of his right hip.  He does not note any shooting pain down his leg or any new numbness.        Past Medical History:  Diagnosis Date  . Arthritis   . Back pain   . Cataracts, bilateral    immature  . GERD (gastroesophageal reflux disease)    using Baking Soda and Water per pt  . Headache(784.0)    related to neck issues  . History of bronchitis   . History of gastric ulcer 20+yrs ago  . Hypertension    takes Benazepril some days  . Joint pain   . Muscle spasm    takes Flexeril daily  . Shortness of breath    with exertion  . Weakness    and numbness in left fingers    Patient Active Problem List   Diagnosis Date Noted  . GERD (gastroesophageal reflux disease)  10/15/2017  . Constipation 01/09/2017  . Gastrointestinal hemorrhage   . Acute gastric ulcer   . Acute esophagitis   . GI bleed 10/04/2016  . Polysubstance abuse ([place]) 10/04/2016  . Tobacco dependence 10/04/2016  . Cervical spondylosis 12/18/2013  . Adjustment disorder 12/20/2011    Past Surgical History:  Procedure Laterality Date  . ANTERIOR CERVICAL DECOMP/DISCECTOMY FUSION N/A 12/18/2013   Procedure: ANTERIOR CERVICAL DECOMPRESSION/DISCECTOMY FUSION CERVICAL THREE-FOUR, FOUR-FIVE, FIVE-SIX W/ BONEGRAFT;  Surgeon: Hosie Spangle, MD;  Location: [place] [facility];  Service: Neurosurgery;  Laterality: N/A;  ANTERIOR CERVICAL DECOMPRESSION/DISCECTOMY FUSION CERVICAL THREE-FOUR, FOUR-FIVE, FIVE-SIX W/ BONEGRAFT  . arm surgery Left   . COLONOSCOPY WITH PROPOFOL N/A 11/15/2017   Procedure: COLONOSCOPY WITH PROPOFOL;  Surgeon: Daneil Dolin, MD;  [facility];  Service: Endoscopy;  Laterality: N/A;  12:30pm  . ESOPHAGOGASTRODUODENOSCOPY (EGD) WITH PROPOFOL N/A 10/05/2016   Procedure: ESOPHAGOGASTRODUODENOSCOPY (EGD) WITH PROPOFOL;  Surgeon: Daneil Dolin, MD;  [facility];  Service: Endoscopy;  Laterality: N/A;  . ESOPHAGOGASTRODUODENOSCOPY (EGD) WITH PROPOFOL N/A 02/15/2017   Procedure: ESOPHAGOGASTRODUODENOSCOPY (EGD) WITH PROPOFOL;  Surgeon: Daneil Dolin, MD;  [facility];  Service: Endoscopy;  Laterality: N/A;  1115  .  INGUINAL HERNIA REPAIR Right 08/18/2016   Procedure: RIGHT INGUINAL HERNORRHAPHY WITH MESH;  Surgeon: Aviva Signs, MD;  Location: [facility];  Service: General;  Laterality: Right;  . JOINT REPLACEMENT Right    hip   . POLYPECTOMY  11/15/2017   Procedure: POLYPECTOMY;  Surgeon: Daneil Dolin, MD;  [facility];  Service: Endoscopy;;  polyp cs splenic flexure times 2,  cecal polyp  . SHOULDER SURGERY Right    x 3        Family History  Problem Relation Age of Onset  . Prostate cancer Father   . Prostate cancer Brother   .  Colon cancer Neg Hx     Social History   Tobacco Use  . Smoking status: Current Some Day Smoker    Packs/day: 0.10    Years: 47.00    Pack years: 4.70    Types: Cigarettes  . Smokeless tobacco: Never Used  Substance Use Topics  . Alcohol use: No  . Drug use: Yes    Types: Marijuana, Cocaine    Comment: As of 10/08/18: "takes puff off joint every now and then"; done cocaine mid-January    Home Medications Prior to Admission medications   Medication Sig Start Date End Date Taking? Authorizing Provider  amLODipine (NORVASC) 10 MG tablet Take 10 mg by mouth daily. 07/12/16   [provider]  atorvastatin (LIPITOR) 10 MG tablet TAKE 1 TABLET BY MOUTH EVERY DAY 12/19/18   Waynetta Sandy, MD  pantoprazole (PROTONIX) 40 MG tablet Take 1 tablet (40 mg total) by mouth daily. 10/08/18   Carlis Stable, NP    Allergies    Nsaids  Review of Systems   Review of Systems  Constitutional: Positive for activity change (due to pain). Negative for fever.  HENT: Negative for congestion.   Respiratory: Negative for chest tightness and shortness of breath.   Cardiovascular: Negative for chest pain and palpitations.  Gastrointestinal: Negative for diarrhea and vomiting.  Neurological: Negative for weakness.    Physical Exam Updated Vital Signs BP 130/81 (BP Location: Right Arm)   Pulse 69   Temp 98.3 F (36.8 C) (Oral)   Resp 18   Ht 5\' 10"  (1.778 m)   Wt 74.8 kg   SpO2 100%   BMI 23.68 kg/m   Physical Exam Constitutional:      General: He is not in acute distress.    Appearance: Normal appearance. He is normal weight. He is not ill-appearing.     Comments: He was sitting on the side of his bed and clear mild discomfort.  He demonstrated mild discomfort with almost all movements move around his bed and walking around the room.  HENT:     Head: Normocephalic and atraumatic.     Nose: Nose normal.  Eyes:     Conjunctiva/sclera: Conjunctivae normal.     Pupils: Pupils  are equal, round, and reactive to light.  Cardiovascular:     Rate and Rhythm: Normal rate and regular rhythm.     Pulses: Normal pulses.     Heart sounds: Normal heart sounds.  Pulmonary:     Effort: Pulmonary effort is normal.     Breath sounds: Normal breath sounds.  Musculoskeletal:        General: Tenderness present. No swelling or deformity.     Cervical back: Normal range of motion and neck supple.     Right lower leg: No edema.     Comments: Mild tenderness to palpation of  the right trochanter and right gluteal region.  No paraspinal tenderness no tenderness to percussion of the vertebrae.  Significant pain with FABER and FADIR testing of the right hip.  Negative straight leg raise bilaterally  Skin:    General: Skin is warm and dry.  Neurological:     General: No focal deficit present.     Mental Status: He is alert and oriented to person, place, and time. Mental status is at baseline.  Psychiatric:        Mood and Affect: Mood normal.        Behavior: Behavior normal.     ED Results / Procedures / Treatments   Labs (all labs ordered are listed, but only abnormal results are displayed) Labs Reviewed - No data to display  EKG None  Radiology No results found.  Procedures Procedures (including critical care time)  Medications Ordered in ED Medications - No data to display  ED Course  I have reviewed the triage vital signs and the nursing notes.  Pertinent labs & imaging results that were available during my care of the patient were reviewed by me and considered in my medical decision making (see chart for details).    MDM Rules/Calculators/A&P                      Mr. Albini is a 64 year old gentleman presenting to the ED with 2 weeks of hip pain.  The differential includes muscular injury, bursitis, hardware malfunction/wear and tear, fracture.  Overall, low suspicion for muscular injury based on persistent/worsening symptoms after 2 weeks.  Low suspicion  for bursitis based on physical exam.  History does not make significant trauma likely so low suspicion for fracture.  Likely related to wearing out of his prosthesis.  We will begin with an x-ray of his hip and follow-up from there.  Will likely end up referring to orthopedics.  No evidence of fracture on hip x-ray.  Some evidence of osteoporotic changes.  Mr. Cortes was discharged home with diclofenac cream and advised to follow-up with orthopedics.  Final Clinical Impression(s) / ED Diagnoses Final diagnoses:  None    Rx / DC Orders ED Discharge Orders    None       Matilde Haymaker, MD 10/27/19 1029    Margette Fast, MD 10/27/19 1941

## 2019-10-27 NOTE — ED Triage Notes (Signed)
Pt reports right hip pain x2 weeks. Pt reports was cleaning out shed and reports got RLE tangled up in cords and reports pulled leg out and statespain ever since. pt also reports right hip replacement approximately 30 years ago and reports "it may be giving out on me." pt able to ambulate but with pain. Pt reports last took oxycodone last night.

## 2019-10-27 NOTE — Discharge Instructions (Addendum)
He came to the ED today to be evaluated for right hip pain.  In the ED, were able to rule out a fracture of your hip.  This does not seem like a muscular injury.  We are suspicious that this is related to your right hip prosthesis.  I recommend a follow-up with the orthopedic provider noted in your discharge paperwork Dr. Arther Abbott.  The orthopedist will be the best person to help evaluate your hip replacement.  You can continue to use the pain medication you have at home in addition to the Voltaren gel that we prescribed.

## 2019-10-27 NOTE — ED Notes (Addendum)
Pt refuses crutches upon discharge. Pt reports he already has crutches at home and can use those. Pt offered wheelchair out to car, but pt insists he will walk. Pt reports he has someone coming to pick him up. Pt aware he cannot drive for 4 hours after receiving the Morphine.

## 2019-10-31 ENCOUNTER — Ambulatory Visit: Payer: Medicare Other | Admitting: Orthopedic Surgery

## 2019-10-31 ENCOUNTER — Encounter: Payer: Self-pay | Admitting: Orthopedic Surgery

## 2019-10-31 ENCOUNTER — Other Ambulatory Visit: Payer: Self-pay

## 2019-10-31 VITALS — BP 152/102 | HR 73 | Ht 70.0 in | Wt 165.0 lb

## 2019-10-31 DIAGNOSIS — M25551 Pain in right hip: Secondary | ICD-10-CM | POA: Diagnosis not present

## 2019-10-31 DIAGNOSIS — M25559 Pain in unspecified hip: Secondary | ICD-10-CM

## 2019-10-31 DIAGNOSIS — M792 Neuralgia and neuritis, unspecified: Secondary | ICD-10-CM

## 2019-10-31 DIAGNOSIS — Z96649 Presence of unspecified artificial hip joint: Secondary | ICD-10-CM

## 2019-10-31 MED ORDER — GABAPENTIN 300 MG PO CAPS: 300.0000 mg | ORAL_CAPSULE | Freq: Three times a day (TID) | ORAL | 5 refills | Status: DC

## 2019-10-31 NOTE — Progress Notes (Signed)
Stephen Sullivan  10/31/2019  Body mass index is 23.68 kg/m.   HISTORY SECTION :  Chief Complaint  Patient presents with  . 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  It seems to be intermittent and did require emergency room visit I have those notes.  His hip x-ray was negative for any implant issues  He comes in complaining of intermittent pain severe when present causes him to be able to walk radiates down his leg  Emergency room records and notes are reviewed.  The patient apparently went to the ER complaining of hip pain has had a hip replacement as noted prior.  Initial films in the ER were negative the actually said he had a total hip but he had a bipolar hip replacement.  Review of Systems  Respiratory: Positive for shortness of breath.   All other systems reviewed and are negative.    has a past medical history of Arthritis, Back pain, Cataracts, bilateral, GERD (gastroesophageal reflux disease), Headache(784.0), History of bronchitis, History of gastric ulcer (20+yrs ago), Hypertension, Joint pain, Muscle spasm, Shortness of breath, and Weakness.   Past Surgical History:  Procedure Laterality Date  . ANTERIOR CERVICAL DECOMP/DISCECTOMY FUSION N/A 12/18/2013   Procedure: ANTERIOR CERVICAL DECOMPRESSION/DISCECTOMY FUSION CERVICAL THREE-FOUR, FOUR-FIVE, FIVE-SIX W/ BONEGRAFT;  Surgeon: Hosie Spangle, MD;  Location: [place] [facility];  Service: Neurosurgery;  Laterality: N/A;  ANTERIOR CERVICAL DECOMPRESSION/DISCECTOMY FUSION CERVICAL THREE-FOUR, FOUR-FIVE, FIVE-SIX W/ BONEGRAFT  . arm surgery Left   . COLONOSCOPY WITH PROPOFOL N/A 11/15/2017   Procedure: COLONOSCOPY WITH  PROPOFOL;  Surgeon: Daneil Dolin, MD;  [facility];  Service: Endoscopy;  Laterality: N/A;  12:30pm  . ESOPHAGOGASTRODUODENOSCOPY (EGD) WITH PROPOFOL N/A 10/05/2016   Procedure: ESOPHAGOGASTRODUODENOSCOPY (EGD) WITH PROPOFOL;  Surgeon: Daneil Dolin, MD;  [facility];  Service: Endoscopy;  Laterality: N/A;  . ESOPHAGOGASTRODUODENOSCOPY (EGD) WITH PROPOFOL N/A 02/15/2017   Procedure: ESOPHAGOGASTRODUODENOSCOPY (EGD) WITH PROPOFOL;  Surgeon: Daneil Dolin, MD;  [facility];  Service: Endoscopy;  Laterality: N/A;  1115  . INGUINAL HERNIA REPAIR Right 08/18/2016   Procedure: RIGHT INGUINAL HERNORRHAPHY WITH MESH;  Surgeon: Aviva Signs, MD;  [facility]: [facility];  Service: General;  Laterality: Right;  . JOINT REPLACEMENT Right    hip   . POLYPECTOMY  11/15/2017   Procedure: POLYPECTOMY;  Surgeon: Daneil Dolin, MD;  [facility];  Service: Endoscopy;;  polyp cs splenic flexure times 2,  cecal polyp  . SHOULDER SURGERY Right    x 3     Body mass index is 23.68 kg/m.   Allergies  Allergen Reactions  . Nsaids Other (See Comments)    Not to take - "busted intestines open"     Current Outpatient Medications:  .  amLODipine (NORVASC) 10 MG tablet, Take 10 mg by mouth daily., Disp: , Rfl:  .  pantoprazole (PROTONIX) 40 MG tablet, Take 1 tablet (40 mg total) by mouth daily., Disp: 90 tablet, Rfl: 3 .  atorvastatin (LIPITOR) 10 MG tablet, TAKE 1 TABLET BY MOUTH EVERY DAY (Patient not taking: Reported on  10/31/2019), Disp: 90 tablet, Rfl: 0 .  diclofenac Sodium (VOLTAREN) 1 % GEL, Apply 2 g topically 4 (four) times daily. (Patient not taking: Reported on 10/31/2019), Disp: 100 g, Rfl: 0 .  gabapentin (NEURONTIN) 300 MG capsule, Take 1 capsule (300 mg total) by mouth 3 (three) times daily., Disp: 90 capsule, Rfl: 5   PHYSICAL EXAM SECTION: 1) BP (!) 152/102   Pulse 73   Ht 5\' 10"  (1.778 m)   Wt 165 lb (74.8 kg)   BMI 23.68 kg/m   Body mass index  is 23.68 kg/m. General appearance: Well-developed well-nourished no gross deformities  2) Cardiovascular normal pulse and perfusion , normal color   3) Neurologically deep tendon reflexes are equal and normal, no sensation loss or deficits no pathologic reflexes  4) Psychological: Awake alert and oriented x3 mood and affect normal  5) Skin no lacerations or ulcerations no nodularity no palpable masses, no erythema or nodularity  6) Musculoskeletal:   Today is ambulating normally.  His leg lengths are equal.  He has normal hip flexion in both hips both hips are stable muscle tone is normal  He is tender in his right buttock nontender midline of his back his tenderness seems to track along the sciatic nerve from the SI joint to the greater trochanter.  Straight leg raise was negative     MEDICAL DECISION MAKING  A.  Encounter Diagnoses  Name Primary?  . Pain in hip region after hip replacement   . Pain in right hip   . Neuralgia and neuritis Yes    B. DATA ANALYSED:  IMAGING: Independent interpretation of images: Yes x-rays of the hip show a stable bipolar prosthesis with no loosening or acetabular erosion  Orders: None  Outside records reviewed: ER records  C. MANAGEMENT as he is not currently symptomatic and his symptoms appear to be intermittent recommend periodic gabapentin as needed  Meds ordered this encounter  Medications  . gabapentin (NEURONTIN) 300 MG capsule    Sig: Take 1 capsule (300 mg total) by mouth 3 (three) times daily.    Dispense:  90 capsule    Refill:  5      Arther Abbott, MD  10/31/2019 11:22 AM

## 2019-10-31 NOTE — Patient Instructions (Signed)
Take this medicine when it acts up, if it doesn't handle it call the office for re evaluation

## 2020-02-26 ENCOUNTER — Other Ambulatory Visit: Payer: Self-pay

## 2020-02-26 DIAGNOSIS — I739 Peripheral vascular disease, unspecified: Secondary | ICD-10-CM

## 2020-03-11 ENCOUNTER — Ambulatory Visit (INDEPENDENT_AMBULATORY_CARE_PROVIDER_SITE_OTHER): Payer: Medicare HMO | Admitting: Physician Assistant

## 2020-03-11 ENCOUNTER — Ambulatory Visit (HOSPITAL_COMMUNITY)
Admission: RE | Admit: 2020-03-11 | Discharge: 2020-03-11 | Disposition: A | Discharge status: Home / Self Care | Payer: Medicare HMO | Source: Ambulatory Visit | Attending: Surgery | Admitting: Surgery

## 2020-03-11 ENCOUNTER — Other Ambulatory Visit: Payer: Self-pay

## 2020-03-11 VITALS — BP 129/83 | HR 80 | Temp 98.4°F | Resp 16 | Ht 70.0 in | Wt 163.0 lb

## 2020-03-11 DIAGNOSIS — I739 Peripheral vascular disease, unspecified: Secondary | ICD-10-CM | POA: Diagnosis present

## 2020-03-11 NOTE — Progress Notes (Addendum)
HISTORY AND PHYSICAL     CC:  follow up. Requesting Provider:  Scotty Court, DO  HPI: This is a 64 y.o. male who is here today for follow up for PAD.    Pt was last seen 09/23/2018 and at that time, he had been getting along just fine but walked about 0.5 mile to the store and got severe cramping in the left calf.  He states this did not get better with rest and he could not proceed and had to get someone to come get him.  It had not happened again when I saw him at that visit.   Otherwise, he really was not having any troubles with standing.   He had originally seen Dr. Donzetta Matters for bilateral thigh pain with walking one block relieved with rest.  He did not have rest pain.    He was seen in the ER in March for right hip pain after becoming entangled in cords while working in his shed.  He did not have any evidence of fx on xray.  He does have hx of right hip replacement in his 78's.  He was prescribed neurontin a few days later for his sx that were intermittent.   The pt returns today for follow up.  He states that he continues to have a hard time walking.  He states that his legs cramp all the way down from his thighs to his feet bilaterally.  One leg is not worse than the either and are equal.  He states he could walk about 1/2 a city block before he has to rest.  He states the pain never really gets better.  He continues to smoke but states he is only smoking 5-6 cigarettes/day.  He does not remember telling me about his chest pain in February 2020 but states he has not had any more pain.  He does have shortness of breath with exertion.    He does not take an aspirin bc he had his intestines to "bust open" in the past.    He states that his PCP was Dr. Melina Copa with [facility] in [facility], but she is retired.    The pt is not on a statin for cholesterol management.  He states he has not gotten a refill.    The pt is not on an aspirin.    Other AC:  none The pt is on CCB for hypertension.    The pt is not have diabetes. Tobacco hx:  current  Pt does not have family hx of AAA.  Past Medical History:  Diagnosis Date  . Arthritis   . Back pain   . Cataracts, bilateral    immature  . GERD (gastroesophageal reflux disease)    using Baking Soda and Water per pt  . Headache(784.0)    related to neck issues  . History of bronchitis   . History of gastric ulcer 20+yrs ago  . Hypertension    takes Benazepril some days  . Joint pain   . Muscle spasm    takes Flexeril daily  . Shortness of breath    with exertion  . Weakness    and numbness in left fingers    Past Surgical History:  Procedure Laterality Date  . ANTERIOR CERVICAL DECOMP/DISCECTOMY FUSION N/A 12/18/2013   Procedure: ANTERIOR CERVICAL DECOMPRESSION/DISCECTOMY FUSION CERVICAL THREE-FOUR, FOUR-FIVE, FIVE-SIX W/ BONEGRAFT;  Surgeon: Hosie Spangle, MD;  Location: [place] [facility];  Service: Neurosurgery;  Laterality: N/A;  ANTERIOR CERVICAL DECOMPRESSION/DISCECTOMY FUSION  CERVICAL THREE-FOUR, FOUR-FIVE, FIVE-SIX W/ BONEGRAFT  . arm surgery Left   . COLONOSCOPY WITH PROPOFOL N/A 11/15/2017   Procedure: COLONOSCOPY WITH PROPOFOL;  Surgeon: Daneil Dolin, MD;  [facility];  Service: Endoscopy;  Laterality: N/A;  12:30pm  . ESOPHAGOGASTRODUODENOSCOPY (EGD) WITH PROPOFOL N/A 10/05/2016   Procedure: ESOPHAGOGASTRODUODENOSCOPY (EGD) WITH PROPOFOL;  Surgeon: Daneil Dolin, MD;  [facility];  Service: Endoscopy;  Laterality: N/A;  . ESOPHAGOGASTRODUODENOSCOPY (EGD) WITH PROPOFOL N/A 02/15/2017   Procedure: ESOPHAGOGASTRODUODENOSCOPY (EGD) WITH PROPOFOL;  Surgeon: Daneil Dolin, MD;  [facility];  Service: Endoscopy;  Laterality: N/A;  1115  . INGUINAL HERNIA REPAIR Right 08/18/2016   Procedure: RIGHT INGUINAL HERNORRHAPHY WITH MESH;  Surgeon: Aviva Signs, MD;  Location: [facility];  Service: General;  Laterality: Right;  . JOINT REPLACEMENT Right    hip   . POLYPECTOMY  11/15/2017    Procedure: POLYPECTOMY;  Surgeon: Daneil Dolin, MD;  [facility];  Service: Endoscopy;;  polyp cs splenic flexure times 2,  cecal polyp  . SHOULDER SURGERY Right    x 3     Allergies  Allergen Reactions  . Nsaids Other (See Comments)    Not to take - "busted intestines open"    Current Outpatient Medications  Medication Sig Dispense Refill  . amLODipine (NORVASC) 10 MG tablet Take 10 mg by mouth daily.    Marland Kitchen atorvastatin (LIPITOR) 10 MG tablet TAKE 1 TABLET BY MOUTH EVERY DAY (Patient not taking: Reported on 10/31/2019) 90 tablet 0  . diclofenac Sodium (VOLTAREN) 1 % GEL Apply 2 g topically 4 (four) times daily. (Patient not taking: Reported on 10/31/2019) 100 g 0  . gabapentin (NEURONTIN) 300 MG capsule Take 1 capsule (300 mg total) by mouth 3 (three) times daily. 90 capsule 5  . pantoprazole (PROTONIX) 40 MG tablet Take 1 tablet (40 mg total) by mouth daily. 90 tablet 3   No current facility-administered medications for this visit.    Family History  Problem Relation Age of Onset  . Prostate cancer Father   . Prostate cancer Brother   . Colon cancer Neg Hx     Social History   Socioeconomic History  . Marital status: Single    Spouse name: Not on file  . Number of children: Not on file  . Years of education: Not on file  . Highest education level: Not on file  Occupational History  . Occupation: disabled  Tobacco Use  . Smoking status: Current Some Day Smoker    Packs/day: 0.10    Years: 47.00    Pack years: 4.70    Types: Cigarettes  . Smokeless tobacco: Never Used  Substance and Sexual Activity  . Alcohol use: No  . Drug use: Yes    Types: Marijuana, Cocaine    Comment: As of 10/08/18: "takes puff off joint every now and then"; done cocaine mid-January  . Sexual activity: Not Currently  Other Topics Concern  . Not on file  Social History Narrative  . Not on file   Social Determinants of Health   Financial Resource Strain:   . Difficulty of  Paying Living Expenses:   Food Insecurity:   . Worried About Charity fundraiser in the Last Year:   . Arboriculturist in the Last Year:   Transportation Needs:   . Film/video editor (Medical):   Marland Kitchen Lack of Transportation (Non-Medical):   Physical Activity:   . Days  of Exercise per Week:   . Minutes of Exercise per Session:   Stress:   . Feeling of Stress :   Social Connections:   . Frequency of Communication with Friends and Family:   . Frequency of Social Gatherings with Friends and Family:   . Attends Religious Services:   . Active Member of Clubs or Organizations:   . Attends Archivist Meetings:   Marland Kitchen Marital Status:   Intimate Partner Violence:   . Fear of Current or Ex-Partner:   . Emotionally Abused:   Marland Kitchen Physically Abused:   . Sexually Abused:      REVIEW OF SYSTEMS:   [X]  denotes positive finding, [ ]  denotes negative finding Cardiac  Comments:  Chest pain or chest pressure:    Shortness of breath upon exertion: x   Short of breath when lying flat: x   Irregular heart rhythm:        Vascular    Pain in calf, thigh, or hip brought on by ambulation: x   Pain in feet at night that wakes you up from your sleep:     Blood clot in your veins:    Leg swelling:         Pulmonary    Oxygen at home:    Productive cough:     Wheezing:         Neurologic    Sudden weakness in arms or legs:     Sudden numbness in arms or legs:     Sudden onset of difficulty speaking or slurred speech:    Temporary loss of vision in one eye:     Problems with dizziness:         Gastrointestinal    Blood in stool:     Vomited blood:         Genitourinary    Burning when urinating:     Blood in urine:        Psychiatric    Major depression:         Hematologic    Bleeding problems:    Problems with blood clotting too easily:        Skin    Rashes or ulcers:        Constitutional    Fever or chills:      PHYSICAL EXAMINATION:  Today's Vitals    03/11/20 1341  BP: 129/83  Pulse: 80  Resp: 16  Temp: 98.4 F (36.9 C)  TempSrc: Temporal  SpO2: 97%  Weight: 163 lb (73.9 kg)  Height: 5\' 10"  (1.778 m)  PainSc: 10-Worst pain ever   Body mass index is 23.39 kg/m.   General:  WDWN in NAD; vital signs documented above Gait: Not observed HENT: WNL, normocephalic Pulmonary: normal non-labored breathing , without wheezing Cardiac: regular HR, without  Murmur; without carotid bruits Abdomen: soft, NT, no masses; aorta is palpable Skin: without rashes Vascular Exam/Pulses:  Right Left  Radial 2+ (normal) 2+ (normal)  Ulnar Unable to palpate Unable to palpate  Femoral 2+ (normal) 2+ (normal)  Popliteal Unable to palpate Unable to palpate  DP 2+ (normal) 2+ (normal)  PT Unable to palpate Unable to palpate   Extremities: without ischemic changes, without Gangrene , without cellulitis; without open wounds;  Musculoskeletal: no muscle wasting or atrophy  Neurologic: A&O X 3;  No focal weakness or paresthesias are detected Psychiatric:  The pt has Normal affect.   Non-Invasive Vascular Imaging:   ABI's/TBI's on 03/11/2020: Right:  0.69/0.54 -  Great toe pressure: 75 Left:  0.87/0.74 - Great toe pressure: 103  Previous ABI's/TBI's on 09/23/2018: Right:  0.79/0.4 - Great toe pressure: 60 Left:  0.99/0.95 - Great toe pressure:  144    ASSESSMENT/PLAN:: 64 y.o. male here for follow up for PAD  -pt ABI's slightly decreased from previous exam and he continues to have palpable DP pulses bilaterally.  His sx are not classic claudication sx.  I did discuss this with Dr. Oneida Alar and we will bring him back for aorto-iliac arterial duplex to make sure he does not have iliac disease.  His aorta is palpable and this exam will also evaluate that as well. We will schedule this for the next 2-4 weeks.  -I discussed sx of ruptured AAA and if he develops these sx, he should call 911 immediately. -discussed the importance of smoking cessation. -pt  needs to follow up with PCP [facility] in [facility] to evaluate blood work prior to restarting statin.    Leontine Locket, [facility] [phone]  Clinic MD:   Oneida Alar

## 2020-03-12 ENCOUNTER — Other Ambulatory Visit: Payer: Self-pay | Admitting: *Deleted

## 2020-03-12 DIAGNOSIS — I70213 Atherosclerosis of native arteries of extremities with intermittent claudication, bilateral legs: Secondary | ICD-10-CM

## 2020-04-09 ENCOUNTER — Ambulatory Visit (HOSPITAL_COMMUNITY)
Admission: RE | Admit: 2020-04-09 | Discharge: 2020-04-09 | Disposition: A | Discharge status: Home / Self Care | Payer: Medicare HMO | Source: Ambulatory Visit | Attending: Vascular Surgery | Admitting: Vascular Surgery

## 2020-04-09 ENCOUNTER — Other Ambulatory Visit: Payer: Self-pay

## 2020-04-09 ENCOUNTER — Ambulatory Visit (INDEPENDENT_AMBULATORY_CARE_PROVIDER_SITE_OTHER): Payer: Medicare HMO | Admitting: Physician Assistant

## 2020-04-09 VITALS — BP 118/77 | HR 60 | Temp 98.2°F | Resp 20 | Ht 70.0 in | Wt 167.0 lb

## 2020-04-09 DIAGNOSIS — I739 Peripheral vascular disease, unspecified: Secondary | ICD-10-CM

## 2020-04-09 DIAGNOSIS — I70213 Atherosclerosis of native arteries of extremities with intermittent claudication, bilateral legs: Secondary | ICD-10-CM

## 2020-04-09 NOTE — Progress Notes (Signed)
HISTORY AND PHYSICAL     CC:  follow up. Requesting Provider:  Adaline Sill, NP  HPI: This is a 64 y.o. male who is here today for follow up for PAD.  He was seen on 03/11/2020 by me.    Pt was seen 09/23/2018 and at that time, he had been getting along just fine but walked about 0.5 mile to the store and got severe cramping in the left calf. He states this did not get better with rest and he could not proceed and had to get someone to come get him.  It had not happened again when I saw him at that visit.   Otherwise, he really was not having any troubles with standing.   He had originally seen Dr. Donzetta Matters for bilateral thigh pain with walking one block relieved with rest.  He did not have rest pain.    He was seen in the ER in March for right hip pain after becoming entangled in cords while working in his shed.  He did not have any evidence of fx on xray.  He does have hx of right hip replacement in his 27's.  He was prescribed neurontin a few days later for his sx that were intermittent.   Pt was last seen 03/11/2020 and at that time, and he was continuing to have a hard time walking.  He said that his legs cramp all the way down from his thighs to his feet bilaterally.  One leg is not worse than the either and are equal.  He states he could walk about 1/2 a city block before he has to rest.  He said the pain never really gets better.  He was still smoking but was down to  only smoking 5-6 cigarettes/day.  He does not remember telling me about his chest pain in February 2020 but states he has not had any more pain.  He does have shortness of breath with exertion.    He does not take an aspirin bc he had his intestines to "bust open" in the past.    He states that his PCP was Dr. Melina Copa with [facility] in [facility], but she is retired.    The pt is not on a statin for cholesterol management.  He states he has not gotten a refill.     Given that his ABI's were slightly decreased and had  did have palpable DP pulses bilaterally.  His sx were not classic claudication sx.  I discussed pt with Dr. Oneida Alar and recommended he come back for aorto-iliac duplex to make sure he did not have iliac disease.  His aortic pulse was also palpable, so this would also evaluate his aorta.  He is here today for that visit.    The pt returns today for follow up.  He states that his legs are about the same.  He tells me the right is a little worse and then thinks it might be the left that's a little worse.  He does not have any non healing wounds. He continues to smoke.   The pt is on a statin for cholesterol management.    The pt is not on an aspirin.    Other AC:  none The pt is on ARB, CCB for hypertension.  The pt does not have diabetes. Tobacco hx:  current  Pt does not have family hx of AAA.  Past Medical History:  Diagnosis Date  . Arthritis   . Back pain   .  Cataracts, bilateral    immature  . GERD (gastroesophageal reflux disease)    using Baking Soda and Water per pt  . Headache(784.0)    related to neck issues  . History of bronchitis   . History of gastric ulcer 20+yrs ago  . Hypertension    takes Benazepril some days  . Joint pain   . Muscle spasm    takes Flexeril daily  . Shortness of breath    with exertion  . Weakness    and numbness in left fingers    Past Surgical History:  Procedure Laterality Date  . ANTERIOR CERVICAL DECOMP/DISCECTOMY FUSION N/A 12/18/2013   Procedure: ANTERIOR CERVICAL DECOMPRESSION/DISCECTOMY FUSION CERVICAL THREE-FOUR, FOUR-FIVE, FIVE-SIX W/ BONEGRAFT;  Surgeon: Hosie Spangle, MD;  Location: [place] [facility];  Service: Neurosurgery;  Laterality: N/A;  ANTERIOR CERVICAL DECOMPRESSION/DISCECTOMY FUSION CERVICAL THREE-FOUR, FOUR-FIVE, FIVE-SIX W/ BONEGRAFT  . arm surgery Left   . COLONOSCOPY WITH PROPOFOL N/A 11/15/2017   Procedure: COLONOSCOPY WITH PROPOFOL;  Surgeon: Daneil Dolin, MD;  [facility];  Service: Endoscopy;   Laterality: N/A;  12:30pm  . ESOPHAGOGASTRODUODENOSCOPY (EGD) WITH PROPOFOL N/A 10/05/2016   Procedure: ESOPHAGOGASTRODUODENOSCOPY (EGD) WITH PROPOFOL;  Surgeon: Daneil Dolin, MD;  [facility];  Service: Endoscopy;  Laterality: N/A;  . ESOPHAGOGASTRODUODENOSCOPY (EGD) WITH PROPOFOL N/A 02/15/2017   Procedure: ESOPHAGOGASTRODUODENOSCOPY (EGD) WITH PROPOFOL;  Surgeon: Daneil Dolin, MD;  [facility];  Service: Endoscopy;  Laterality: N/A;  1115  . INGUINAL HERNIA REPAIR Right 08/18/2016   Procedure: RIGHT INGUINAL HERNORRHAPHY WITH MESH;  Surgeon: Aviva Signs, MD;  Location: [facility];  Service: General;  Laterality: Right;  . JOINT REPLACEMENT Right    hip   . POLYPECTOMY  11/15/2017   Procedure: POLYPECTOMY;  Surgeon: Daneil Dolin, MD;  [facility];  Service: Endoscopy;;  polyp cs splenic flexure times 2,  cecal polyp  . SHOULDER SURGERY Right    x 3     Allergies  Allergen Reactions  . Nsaids Other (See Comments)    Not to take - "busted intestines open"    Current Outpatient Medications  Medication Sig Dispense Refill  . amLODipine (NORVASC) 10 MG tablet Take 10 mg by mouth daily.    Marland Kitchen atorvastatin (LIPITOR) 10 MG tablet TAKE 1 TABLET BY MOUTH EVERY DAY 90 tablet 0  . diclofenac Sodium (VOLTAREN) 1 % GEL Apply 2 g topically 4 (four) times daily. 100 g 0  . gabapentin (NEURONTIN) 300 MG capsule Take 1 capsule (300 mg total) by mouth 3 (three) times daily. 90 capsule 5  . pantoprazole (PROTONIX) 40 MG tablet Take 1 tablet (40 mg total) by mouth daily. 90 tablet 3   No current facility-administered medications for this visit.    Family History  Problem Relation Age of Onset  . Prostate cancer Father   . Prostate cancer Brother   . Colon cancer Neg Hx     Social History   Socioeconomic History  . Marital status: Single    Spouse name: Not on file  . Number of children: Not on file  . Years of education: Not on file  . Highest education  level: Not on file  Occupational History  . Occupation: disabled  Tobacco Use  . Smoking status: Current Some Day Smoker    Packs/day: 0.10    Years: 47.00    Pack years: 4.70    Types: Cigarettes  . Smokeless tobacco: Never Used  Substance and  Sexual Activity  . Alcohol use: No  . Drug use: Yes    Types: Marijuana, Cocaine    Comment: As of 10/08/18: "takes puff off joint every now and then"; done cocaine mid-January  . Sexual activity: Not Currently  Other Topics Concern  . Not on file  Social History Narrative  . Not on file   Social Determinants of Health   Financial Resource Strain:   . Difficulty of Paying Living Expenses: Not on file  Food Insecurity:   . Worried About Charity fundraiser in the Last Year: Not on file  . Ran Out of Food in the Last Year: Not on file  Transportation Needs:   . Lack of Transportation (Medical): Not on file  . Lack of Transportation (Non-Medical): Not on file  Physical Activity:   . Days of Exercise per Week: Not on file  . Minutes of Exercise per Session: Not on file  Stress:   . Feeling of Stress : Not on file  Social Connections:   . Frequency of Communication with Friends and Family: Not on file  . Frequency of Social Gatherings with Friends and Family: Not on file  . Attends Religious Services: Not on file  . Active Member of Clubs or Organizations: Not on file  . Attends Archivist Meetings: Not on file  . Marital Status: Not on file  Intimate Partner Violence:   . Fear of Current or Ex-Partner: Not on file  . Emotionally Abused: Not on file  . Physically Abused: Not on file  . Sexually Abused: Not on file     REVIEW OF SYSTEMS:   [X]  denotes positive finding, [ ]  denotes negative finding Cardiac  Comments:  Chest pain or chest pressure:    Shortness of breath upon exertion: x   Short of breath when lying flat:    Irregular heart rhythm:        Vascular    Pain in calf, thigh, or hip brought on by  ambulation: x   Pain in feet at night that wakes you up from your sleep:     Blood clot in your veins:    Leg swelling:         Pulmonary    Oxygen at home:    Productive cough:     Wheezing:         Neurologic    Sudden weakness in arms or legs:     Sudden numbness in arms or legs:     Sudden onset of difficulty speaking or slurred speech:    Temporary loss of vision in one eye:     Problems with dizziness:         Gastrointestinal    Blood in stool:     Vomited blood:         Genitourinary    Burning when urinating:     Blood in urine:        Psychiatric    Major depression:         Hematologic    Bleeding problems:    Problems with blood clotting too easily:        Skin    Rashes or ulcers:        Constitutional    Fever or chills:      PHYSICAL EXAMINATION:  Today's Vitals   04/09/20 0946  BP: 118/77  Pulse: 60  Resp: 20  Temp: 98.2 F (36.8 C)  TempSrc: Temporal  SpO2: 99%  Weight:  167 lb (75.8 kg)  Height: 5\' 10"  (1.778 m)   Body mass index is 23.96 kg/m.   General:  WDWN in NAD; vital signs documented above Gait: Not observed HENT: WNL, normocephalic Pulmonary: normal non-labored breathing , without wheezing Cardiac: regular HR, without  Murmur; without carotid bruits Abdomen: soft, NT, no masses Skin: without rashes Vascular Exam/Pulses:  Right Left  Radial 2+ (normal) 2+ (normal)  Femoral 2+ (normal) 2+ (normal)  Popliteal Unable to palpate Unable to palpate  DP 1+ (weak) 2+ (normal)  PT Unable to palpate Unable to palpate   Extremities: without ischemic changes, without Gangrene , without cellulitis; without open wounds;  Musculoskeletal: no muscle wasting or atrophy  Neurologic: A&O X 3;  No focal weakness or paresthesias are detected Psychiatric:  The pt has Normal affect.   Non-Invasive Vascular Imaging:   ABI's/TBI's on 03/11/2020: Right:  0.69/0.54 - Great toe pressure: 75 Left:  0.87/0.74 - Great toe pressure:  103  Arterial duplex on 04/09/2020: Abdominal Aorta Findings:  +-------------+-------+----------+----------+--------+--------+--------+  Location   AP (cm)Trans (cm)PSV (cm/s)WaveformThrombusComments  +-------------+-------+----------+----------+--------+--------+--------+  Proximal   1.94  2.13                      +-------------+-------+----------+----------+--------+--------+--------+  Mid     2.24  2.22                      +-------------+-------+----------+----------+--------+--------+--------+  Distal    2.10  2.24                      +-------------+-------+----------+----------+--------+--------+--------+  RT CIA Prox 1.2  1.2    64                  +-------------+-------+----------+----------+--------+--------+--------+  RT CIA Mid           112                  +-------------+-------+----------+----------+--------+--------+--------+  RT CIA Distal         107                  +-------------+-------+----------+----------+--------+--------+--------+  RT EIA Prox          91                  +-------------+-------+----------+----------+--------+--------+--------+  RT EIA Mid           82                  +-------------+-------+----------+----------+--------+--------+--------+  RT EIA Distal         441        Present       +-------------+-------+----------+----------+--------+--------+--------+  LT CIA Prox          114                  +-------------+-------+----------+----------+--------+--------+--------+  LT CIA Mid           166                  +-------------+-------+----------+----------+--------+--------+--------+  LT CIA  Distal         168                  +-------------+-------+----------+----------+--------+--------+--------+  LT EIA Prox          125                  +-------------+-------+----------+----------+--------+--------+--------+  LT EIA Mid  155                  +-------------+-------+----------+----------+--------+--------+--------+  LT EIA Distal         192                  +-------------+-------+----------+----------+--------+--------+--------+   Summary:  Abdominal Aorta: The largest aortic measurement is 2.2 cm.  Hypoechoic thrombus involving the right distal external iliac  artery/proximal common femoral artery with velocities suggestive of a >50%  stenosis.  No hemodynamically significant stenosis observed involving the left common  iliac or external iliac arteries.   Previous ABI's/TBI's on 09/23/2018: Right:  0.79/0.4 - Great toe pressure: 60 Left:  0.99/0.95 - Great toe pressure:  144    ASSESSMENT/PLAN:: 64 y.o. male here for follow up with arterial studies  -pt continues to have bilateral leg pain with walking and sx are not classic for claudication.  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  I reviewed his duplex today with Dr. Donzetta Matters.  I discussed aortogram with pt and given he does not have any non healing wounds or limb threatening ischemia at this time, we will bring him back in 6 months and repeat ABI's.  His leg pain could be due to spine issues as well and may benefit from workup for that. -his u/s today did not reveal any evidence of aneurysm and measured 2.2cm.   -pt will f/u in 6 months with ABI's - he will call sooner should he develop any wounds that won't heal. -med list states he is taking a statin.  He is not sure.  He would definitely benefit from this. -again encouraged pt to quit smoking.   Leontine Locket, [facility] [phone]  Clinic MD:   Donzetta Matters

## 2020-04-13 ENCOUNTER — Other Ambulatory Visit: Payer: Self-pay | Admitting: *Deleted

## 2020-04-13 DIAGNOSIS — I739 Peripheral vascular disease, unspecified: Secondary | ICD-10-CM

## 2020-05-19 ENCOUNTER — Encounter (HOSPITAL_COMMUNITY): Payer: Self-pay

## 2020-05-19 ENCOUNTER — Emergency Department (HOSPITAL_COMMUNITY): Payer: Medicare HMO

## 2020-05-19 ENCOUNTER — Emergency Department (HOSPITAL_COMMUNITY)
Admission: EM | Admit: 2020-05-19 | Discharge: 2020-05-19 | Disposition: A | Discharge status: Home / Self Care | Payer: Medicare HMO | Attending: Emergency Medicine | Admitting: Emergency Medicine

## 2020-05-19 ENCOUNTER — Other Ambulatory Visit: Payer: Self-pay

## 2020-05-19 DIAGNOSIS — M79602 Pain in left arm: Secondary | ICD-10-CM | POA: Insufficient documentation

## 2020-05-19 DIAGNOSIS — Z79899 Other long term (current) drug therapy: Secondary | ICD-10-CM | POA: Insufficient documentation

## 2020-05-19 DIAGNOSIS — I10 Essential (primary) hypertension: Secondary | ICD-10-CM | POA: Diagnosis not present

## 2020-05-19 DIAGNOSIS — Z96641 Presence of right artificial hip joint: Secondary | ICD-10-CM | POA: Diagnosis not present

## 2020-05-19 DIAGNOSIS — F17210 Nicotine dependence, cigarettes, uncomplicated: Secondary | ICD-10-CM | POA: Diagnosis not present

## 2020-05-19 MED ORDER — BACLOFEN 10 MG PO TABS: 10.0000 mg | ORAL_TABLET | Freq: Three times a day (TID) | ORAL | 0 refills | Status: DC

## 2020-05-19 MED ORDER — PREDNISONE 10 MG PO TABS: ORAL_TABLET | ORAL | 0 refills | Status: DC

## 2020-05-19 NOTE — Discharge Instructions (Addendum)
The ultrasound of your left arm did not show evidence of a blood clot.  The symptoms of your left arm are likely related to your old injury.  Call your primary doctor to arrange a follow-up appointment.  Take the medication prescribed as directed.

## 2020-05-19 NOTE — ED Provider Notes (Signed)
[facility] EMERGENCY DEPARTMENT Provider Note   CSN: [id number] Arrival date & time: 05/19/20  1233     History Chief Complaint  Patient presents with  . Arm Pain    [name] is a 64 y.o. male.  HPI      Stephen Sullivan is a 64 y.o. male past medical history of hypertension, and chronic left upper extremity weakness secondary to a traumatic injury 30 years ago that resulted in weakness and "nerve damage" of the left arm.  He presents to the Emergency Department complaining of increasing pain of the left arm and increasing numbness of his fingers.  Symptoms have been present for 6 days.  He describes waxing and waning in intensity with pain somewhat improved upon arrival.  He states that he was seen by his primary care provider earlier today and was advised to come to the emergency department for further evaluation of a possible blood clot of his arm.  No recent invasive procedures or other injuries.  He states he takes 5 mg hydrocodone daily without relief.  His pain is described as mostly from his elbow down to his fingers although he does report some left-sided neck pain as well.  He denies fever, chills, swelling or redness of his left arm.  No history of prior blood clots.  He does take hydrocodone which does not help control his pain.  Past Medical History:  Diagnosis Date  . Arthritis   . Back pain   . Cataracts, bilateral    immature  . GERD (gastroesophageal reflux disease)    using Baking Soda and Water per pt  . Headache(784.0)    related to neck issues  . History of bronchitis   . History of gastric ulcer 20+yrs ago  . Hypertension    takes Benazepril some days  . Joint pain   . Muscle spasm    takes Flexeril daily  . Shortness of breath    with exertion  . Weakness    and numbness in left fingers    Patient Active Problem List   Diagnosis Date Noted  . GERD (gastroesophageal reflux disease) 10/15/2017  . Constipation 01/09/2017  .  Gastrointestinal hemorrhage   . Acute gastric ulcer   . Acute esophagitis   . GI bleed 10/04/2016  . Polysubstance abuse ([place]) 10/04/2016  . Tobacco dependence 10/04/2016  . Cervical spondylosis 12/18/2013  . Adjustment disorder 12/20/2011    Past Surgical History:  Procedure Laterality Date  . ANTERIOR CERVICAL DECOMP/DISCECTOMY FUSION N/A 12/18/2013   Procedure: ANTERIOR CERVICAL DECOMPRESSION/DISCECTOMY FUSION CERVICAL THREE-FOUR, FOUR-FIVE, FIVE-SIX W/ BONEGRAFT;  Surgeon: Hosie Spangle, MD;  Location: [place] [facility];  Service: Neurosurgery;  Laterality: N/A;  ANTERIOR CERVICAL DECOMPRESSION/DISCECTOMY FUSION CERVICAL THREE-FOUR, FOUR-FIVE, FIVE-SIX W/ BONEGRAFT  . arm surgery Left   . COLONOSCOPY WITH PROPOFOL N/A 11/15/2017   Procedure: COLONOSCOPY WITH PROPOFOL;  Surgeon: Daneil Dolin, MD;  [facility];  Service: Endoscopy;  Laterality: N/A;  12:30pm  . ESOPHAGOGASTRODUODENOSCOPY (EGD) WITH PROPOFOL N/A 10/05/2016   Procedure: ESOPHAGOGASTRODUODENOSCOPY (EGD) WITH PROPOFOL;  Surgeon: Daneil Dolin, MD;  [facility];  Service: Endoscopy;  Laterality: N/A;  . ESOPHAGOGASTRODUODENOSCOPY (EGD) WITH PROPOFOL N/A 02/15/2017   Procedure: ESOPHAGOGASTRODUODENOSCOPY (EGD) WITH PROPOFOL;  Surgeon: Daneil Dolin, MD;  [facility];  Service: Endoscopy;  Laterality: N/A;  1115  . INGUINAL HERNIA REPAIR Right 08/18/2016   Procedure: RIGHT INGUINAL HERNORRHAPHY WITH MESH;  Surgeon: Aviva Signs, MD;  Location:  [facility];  Service: General;  Laterality: Right;  . JOINT REPLACEMENT Right    hip   . POLYPECTOMY  11/15/2017   Procedure: POLYPECTOMY;  Surgeon: Daneil Dolin, MD;  Location: [facility];  Service: Endoscopy;;  polyp cs splenic flexure times 2,  cecal polyp  . SHOULDER SURGERY Right    x 3        Family History  Problem Relation Age of Onset  . Prostate cancer Father   . Prostate cancer Brother   . Colon cancer Neg Hx     Social History     Tobacco Use  . Smoking status: Current Some Day Smoker    Packs/day: 0.10    Years: 47.00    Pack years: 4.70    Types: Cigarettes  . Smokeless tobacco: Never Used  Substance Use Topics  . Alcohol use: No  . Drug use: Yes    Types: Marijuana    Home Medications Prior to Admission medications   Medication Sig Start Date End Date Taking? Authorizing Provider  Acetaminophen-Codeine 300-30 MG tablet Take 1-2 tablets by mouth at bedtime as needed. 03/30/20   [provider]  amLODipine (NORVASC) 10 MG tablet Take 10 mg by mouth daily. 07/12/16   [provider]  atorvastatin (LIPITOR) 10 MG tablet TAKE 1 TABLET BY MOUTH EVERY DAY 12/19/18   Waynetta Sandy, MD  gabapentin (NEURONTIN) 300 MG capsule Take 1 capsule (300 mg total) by mouth 3 (three) times daily. Patient not taking: Reported on 04/09/2020 10/31/19   Carole Civil, MD  losartan-hydrochlorothiazide [facility]) 50-12.5 MG tablet Take 1 tablet by mouth daily. 03/15/20   [provider]  pantoprazole (PROTONIX) 40 MG tablet Take 1 tablet (40 mg total) by mouth daily. Patient not taking: Reported on 04/09/2020 10/08/18   Carlis Stable, NP    Allergies    Nsaids  Review of Systems   Review of Systems  Constitutional: Negative for chills and fever.  Respiratory: Negative for shortness of breath.   Cardiovascular: Negative for chest pain.  Gastrointestinal: Negative for nausea and vomiting.  Musculoskeletal: Positive for arthralgias. Negative for joint swelling.  Skin: Negative for color change, pallor and wound.  Neurological: Negative for dizziness, weakness, numbness and headaches.       Physical Exam Updated Vital Signs BP (!) 149/93 (BP Location: Right Arm)   Pulse 62   Temp 98.4 F (36.9 C) (Oral)   Resp 15   Ht 5\' 11"  (1.803 m)   Wt 77.1 kg   SpO2 100%   BMI 23.71 kg/m   Physical Exam Vitals and nursing note reviewed.  Constitutional:      General: He is not in acute  distress.    Appearance: Normal appearance.  Cardiovascular:     Rate and Rhythm: Normal rate and regular rhythm.     Pulses: Normal pulses.  Pulmonary:     Effort: Pulmonary effort is normal.     Breath sounds: Normal breath sounds.  Chest:     Chest wall: No tenderness.  Abdominal:     Palpations: Abdomen is soft.     Tenderness: There is no abdominal tenderness.  Musculoskeletal:        General: No tenderness or signs of injury.     Cervical back: Normal range of motion.     Comments: Grip strength slightly weaker on the left.  No erythema or edema of the left upper extremity.  Pt has FROM   Skin:  General: Skin is warm.     Capillary Refill: Capillary refill takes 2 to 3 seconds.     Coloration: Skin is not pale.     Findings: No erythema.  Neurological:     Mental Status: He is alert.     ED Results / Procedures / Treatments   Labs (all labs ordered are listed, but only abnormal results are displayed) Labs Reviewed - No data to display  EKG None  Radiology  US Venous Img Upper Uni Left  Result Date: 05/19/2020 CLINICAL DATA:  Left arm pain, injury 3 years prior EXAM: LEFT UPPER EXTREMITY VENOUS DOPPLER ULTRASOUND TECHNIQUE: Gray-scale sonography with graded compression, as well as color Doppler and duplex ultrasound were performed to evaluate the upper extremity deep venous system from the level of the subclavian vein and including the jugular, axillary, basilic, radial, ulnar and upper cephalic vein. Spectral Doppler was utilized to evaluate flow at rest and with distal augmentation maneuvers. COMPARISON:  None. FINDINGS: Contralateral Subclavian Vein: Respiratory phasicity is normal and symmetric with the symptomatic side. No evidence of thrombus. Normal compressibility. Internal Jugular Vein: No evidence of thrombus. Normal compressibility, respiratory phasicity and response to augmentation. Subclavian Vein: No evidence of thrombus. Normal compressibility, respiratory  phasicity and response to augmentation. Axillary Vein: No evidence of thrombus. Normal compressibility, respiratory phasicity and response to augmentation. Cephalic Vein: No evidence of thrombus. Normal compressibility, respiratory phasicity and response to augmentation. Basilic Vein: No evidence of thrombus. Normal compressibility, respiratory phasicity and response to augmentation. Brachial Veins: No evidence of thrombus. Normal compressibility, respiratory phasicity and response to augmentation. Radial Veins: No evidence of thrombus. Normal compressibility and response to augmentation. Ulnar Veins: No evidence of thrombus. Normal compressibility and response to augmentation. Venous Reflux:  None visualized. Other Findings:  None visualized. IMPRESSION: No evidence of DVT within the left upper extremity. Electronically Signed   By: Lovena Le M.D.   On: 05/19/2020 18:02    Procedures Procedures (including critical care time)  Medications Ordered in ED Medications - No data to display  ED Course  I have reviewed the triage vital signs and the nursing notes.  Pertinent labs & imaging results that were available during my care of the patient were reviewed by me and considered in my medical decision making (see chart for details).    MDM Rules/Calculators/A&P                         Here from PCPs office for evaluation of possible blood clot of the left upper extremity.  No recent injury or invasive procedures.  No erythema or edema of the right arm.  Neurovascularly intact.   Left arm pain and numbness is likely chronic and secondary to old injury.  Venous imaging of the extremity is negative for evidence of DVT. He may have a cervical radiculopathy, but he is without exam findings to suggest emergent process.   Patient states he is unable to tolerate NSAIDs due to GI history.  Database reviewed, patient has ongoing prescription for hydrocodone 5/325 mg.  Do not feel that stronger narcotics are  indicated.  Will prescribe short course of prednisone and muscle relaxer.  Feel patient is appropriate for discharge home, he agrees to follow-up with PCP.    Final Clinical Impression(s) / ED Diagnoses Final diagnoses:  Left arm pain    Rx / DC Orders ED Discharge Orders    None       Keylah Darwish, PA-C  05/19/20 1829    Hayden Rasmussen, MD 05/20/20 1052

## 2020-05-19 NOTE — ED Triage Notes (Signed)
Pt to er, pt states that he is here for some arm pain in his L arm, states that he went to his doctor and was told that he had some nerve damage.  States that he has very bad arm pain.  Pt states that he normally takes vicodin but his pmd won't give him anything stronger.

## 2020-10-08 ENCOUNTER — Other Ambulatory Visit: Payer: Self-pay

## 2020-10-08 ENCOUNTER — Ambulatory Visit (HOSPITAL_COMMUNITY)
Admission: RE | Admit: 2020-10-08 | Discharge: 2020-10-08 | Disposition: A | Discharge status: Home / Self Care | Payer: Medicare HMO | Source: Ambulatory Visit | Attending: Vascular Surgery | Admitting: Vascular Surgery

## 2020-10-08 ENCOUNTER — Ambulatory Visit: Payer: Medicare HMO | Admitting: Physician Assistant

## 2020-10-08 VITALS — BP 131/80 | HR 73 | Temp 98.2°F | Resp 20 | Ht 71.0 in | Wt 160.8 lb

## 2020-10-08 DIAGNOSIS — R200 Anesthesia of skin: Secondary | ICD-10-CM | POA: Diagnosis not present

## 2020-10-08 DIAGNOSIS — I739 Peripheral vascular disease, unspecified: Secondary | ICD-10-CM | POA: Diagnosis present

## 2020-10-08 NOTE — H&P (View-Only) (Signed)
Office Note     CC:  follow up Requesting Provider:  Adaline Sill, NP  HPI: Stephen Sullivan is a 65 y.o. ([birth year]) male who presents for routine follow-up of lower extremity pain with exercise.  He has had multiple imaging studies without findings of significant flow-limiting arterial disease.  He presents today for follow-up ABIs.  His sister accompanies him today.  He continues to complain of bilateral calf claudication at approximately 100 feet.  He states over the past year and a half he is doing less and less activity due to leg pain.  He does not feel as though one leg is more painful than the other.  He denies rest pain, thigh or buttock claudication.  He also complains of right arm numbness x 2 weeks.  He denies neck pain.  He denies monocular blindness, slurred speech, facial drooping or upper extremity weakness.  His history is significant for anterior cervical decompression/discectomy by Dr. Sherwood Gambler in 2015.  The pt is on a statin for cholesterol management.    The pt is not on an aspirin.    Other AC:  none The pt is on ARB, CCB for hypertension.  The pt does not have diabetes. Tobacco hx:  current  Pt does not have family hx of AAA. Past Medical History:  Diagnosis Date  . Arthritis   . Back pain   . Cataracts, bilateral    immature  . GERD (gastroesophageal reflux disease)    using Baking Soda and Water per pt  . Headache(784.0)    related to neck issues  . History of bronchitis   . History of gastric ulcer 20+yrs ago  . Hypertension    takes Benazepril some days  . Joint pain   . Muscle spasm    takes Flexeril daily  . Shortness of breath    with exertion  . Weakness    and numbness in left fingers    Past Surgical History:  Procedure Laterality Date  . ANTERIOR CERVICAL DECOMP/DISCECTOMY FUSION N/A 12/18/2013   Procedure: ANTERIOR CERVICAL DECOMPRESSION/DISCECTOMY FUSION CERVICAL THREE-FOUR, FOUR-FIVE, FIVE-SIX W/ BONEGRAFT;  Surgeon: Hosie Spangle, MD;  Location: [place] [facility];  Service: Neurosurgery;  Laterality: N/A;  ANTERIOR CERVICAL DECOMPRESSION/DISCECTOMY FUSION CERVICAL THREE-FOUR, FOUR-FIVE, FIVE-SIX W/ BONEGRAFT  . arm surgery Left   . COLONOSCOPY WITH PROPOFOL N/A 11/15/2017   Procedure: COLONOSCOPY WITH PROPOFOL;  Surgeon: Daneil Dolin, MD;  [facility];  Service: Endoscopy;  Laterality: N/A;  12:30pm  . ESOPHAGOGASTRODUODENOSCOPY (EGD) WITH PROPOFOL N/A 10/05/2016   Procedure: ESOPHAGOGASTRODUODENOSCOPY (EGD) WITH PROPOFOL;  Surgeon: Daneil Dolin, MD;  [facility];  Service: Endoscopy;  Laterality: N/A;  . ESOPHAGOGASTRODUODENOSCOPY (EGD) WITH PROPOFOL N/A 02/15/2017   Procedure: ESOPHAGOGASTRODUODENOSCOPY (EGD) WITH PROPOFOL;  Surgeon: Daneil Dolin, MD;  [facility];  Service: Endoscopy;  Laterality: N/A;  1115  . INGUINAL HERNIA REPAIR Right 08/18/2016   Procedure: RIGHT INGUINAL HERNORRHAPHY WITH MESH;  Surgeon: Aviva Signs, MD;  Location: [facility];  Service: General;  Laterality: Right;  . JOINT REPLACEMENT Right    hip   . POLYPECTOMY  11/15/2017   Procedure: POLYPECTOMY;  Surgeon: Daneil Dolin, MD;  [facility];  Service: Endoscopy;;  polyp cs splenic flexure times 2,  cecal polyp  . SHOULDER SURGERY Right    x 3     Social History   Socioeconomic History  . Marital status: Single    Spouse name: Not on  file  . Number of children: Not on file  . Years of education: Not on file  . Highest education level: Not on file  Occupational History  . Occupation: disabled  Tobacco Use  . Smoking status: Current Some Day Smoker    Packs/day: 0.10    Years: 47.00    Pack years: 4.70    Types: Cigarettes  . Smokeless tobacco: Never Used  Substance and Sexual Activity  . Alcohol use: No  . Drug use: Yes    Types: Marijuana  . Sexual activity: Not Currently  Other Topics Concern  . Not on file  Social History Narrative  . Not on file   Social  Determinants of Health   Financial Resource Strain: Not on file  Food Insecurity: Not on file  Transportation Needs: Not on file  Physical Activity: Not on file  Stress: Not on file  Social Connections: Not on file  Intimate Partner Violence: Not on file   Family History  Problem Relation Age of Onset  . Prostate cancer Father   . Prostate cancer Brother   . Colon cancer Neg Hx     Current Outpatient Medications  Medication Sig Dispense Refill  . amLODipine (NORVASC) 10 MG tablet Take 10 mg by mouth daily.    Marland Kitchen atorvastatin (LIPITOR) 20 MG tablet Take 20 mg by mouth daily.    Marland Kitchen losartan-hydrochlorothiazide (HYZAAR) 100-25 MG tablet Take 1 tablet by mouth daily.    . pantoprazole (PROTONIX) 40 MG tablet Take 1 tablet (40 mg total) by mouth daily. 90 tablet 3   No current facility-administered medications for this visit.    Allergies  Allergen Reactions  . Nsaids Other (See Comments)    Not to take - "busted intestines open"     REVIEW OF SYSTEMS:   [X]  denotes positive finding, [ ]  denotes negative finding Cardiac  Comments:  Chest pain or chest pressure:    Shortness of breath upon exertion:    Short of breath when lying flat:    Irregular heart rhythm:        Vascular    Pain in calf, thigh, or hip brought on by ambulation: x   Pain in feet at night that wakes you up from your sleep:     Blood clot in your veins:    Leg swelling:         Pulmonary    Oxygen at home:    Productive cough:     Wheezing:         Neurologic    Sudden weakness in arms or legs:     Sudden numbness in arms or legs:     Sudden onset of difficulty speaking or slurred speech:    Temporary loss of vision in one eye:     Problems with dizziness:         Gastrointestinal    Blood in stool:     Vomited blood:         Genitourinary    Burning when urinating:     Blood in urine:        Psychiatric    Major depression:         Hematologic    Bleeding problems:    Problems with  blood clotting too easily:        Skin    Rashes or ulcers:        Constitutional    Fever or chills:      PHYSICAL EXAMINATION: Vitals:  10/08/20 1113  BP: 131/80  Pulse: 73  Resp: 20  Temp: 98.2 F (36.8 C)  SpO2: 98%    General:  WDWN in NAD; vital signs documented above Gait: Unaided, no ataxia HENT: WNL, normocephalic Pulmonary: normal non-labored breathing , without Rales, rhonchi,  wheezing Cardiac: regular HR, without  Murmurs without carotid bruit Abdomen: soft, NT, no masses Skin: without rashes Vascular Exam/Pulses: 2+ bilateral radial and femoral pulses.  1+ left dorsalis pedis pulse.  I cannot palpate right pedal pulses. Extremities: without acute ischemic changes, without Gangrene , without cellulitis; without open wounds;  Musculoskeletal: no muscle wasting or atrophy  Neurologic: A&O X 3;  No focal weakness or paresthesias are detected Psychiatric:  The pt has Normal affect.   Non-Invasive Vascular Imaging:   10/08/2020 Bilateral ABIs appear essentially unchanged compared to prior study on  03/11/2020.    Summary:  Right: Resting right ankle-brachial index indicates moderate right lower  extremity arterial disease.  The right toe-brachial index is absent. Waveforms are monophasic  Left: Resting left ankle-brachial index indicates mild left lower  extremity arterial disease.  The left toe-brachial index is normal. Waveforms are biphasic and triphasic.    ASSESSMENT/PLAN:: 64 y.o. male here for follow up for bilateral lower extremity claudication.  Unable to obtain toe pressure on the right today.  The patient feels as though his symptoms are progressing.  I discussed risks and rationale of arteriogram.  I discussed with the patient and his sister that risks include, but are not limited to, IV contrast reaction, kidney injury secondary to IV contrast, bleeding, hematoma or pseudoaneurysm of the femoral artery.  He is in agreement with proceeding on  Monday, March 14th with Dr. Donzetta Matters.  Patient complaining of right upper extremity numbness.  Will obtain carotid ultrasound and refer him back to Dr. Sherwood Gambler for cervical spine evaluation.  Strongly encouraged the patient to stop all tobacco products immediately.  Continue good blood pressure control and monitoring with primary care physician.  Addendum 10/11/2020: Carotid duplex Right Carotid: Velocities in the right ICA are consistent with a 1-39% stenosis. Nonhemodynamically significant plaque <50% noted in the CCA. Left Carotid: Velocities in the left ICA are consistent with a 40-59% stenosis. Nonhemodynamically significant plaque <50% noted in the CCA.  Vertebrals: Bilateral vertebral arteries demonstrate antegrade flow. Subclavians: Normal flow hemodynamics were seen in bilateral subclavian arteries.  Barbie Banner, PA-C [facility] [phone]  Clinic MD:   Dr. Stanford Breed on-call

## 2020-10-08 NOTE — Progress Notes (Addendum)
Office Note     CC:  follow up Requesting Provider:  Adaline Sill, NP  HPI: Stephen Sullivan is a 65 y.o. (25-May-1956) male who presents for routine follow-up of lower extremity pain with exercise.  He has had multiple imaging studies without findings of significant flow-limiting arterial disease.  He presents today for follow-up ABIs.  His sister accompanies him today.  He continues to complain of bilateral calf claudication at approximately 100 feet.  He states over the past year and a half he is doing less and less activity due to leg pain.  He does not feel as though one leg is more painful than the other.  He denies rest pain, thigh or buttock claudication.  He also complains of right arm numbness x 2 weeks.  He denies neck pain.  He denies monocular blindness, slurred speech, facial drooping or upper extremity weakness.  His history is significant for anterior cervical decompression/discectomy by Dr. Sherwood Gambler in 2015.  The pt is on a statin for cholesterol management.    The pt is not on an aspirin.    Other AC:  none The pt is on ARB, CCB for hypertension.  The pt does not have diabetes. Tobacco hx:  current  Pt does not have family hx of AAA. Past Medical History:  Diagnosis Date  . Arthritis   . Back pain   . Cataracts, bilateral    immature  . GERD (gastroesophageal reflux disease)    using Baking Soda and Water per pt  . Headache(784.0)    related to neck issues  . History of bronchitis   . History of gastric ulcer 20+yrs ago  . Hypertension    takes Benazepril some days  . Joint pain   . Muscle spasm    takes Flexeril daily  . Shortness of breath    with exertion  . Weakness    and numbness in left fingers    Past Surgical History:  Procedure Laterality Date  . ANTERIOR CERVICAL DECOMP/DISCECTOMY FUSION N/A 12/18/2013   Procedure: ANTERIOR CERVICAL DECOMPRESSION/DISCECTOMY FUSION CERVICAL THREE-FOUR, FOUR-FIVE, FIVE-SIX W/ BONEGRAFT;  Surgeon: Hosie Spangle, MD;  Location: [facility];  Service: Neurosurgery;  Laterality: N/A;  ANTERIOR CERVICAL DECOMPRESSION/DISCECTOMY FUSION CERVICAL THREE-FOUR, FOUR-FIVE, FIVE-SIX W/ BONEGRAFT  . arm surgery Left   . COLONOSCOPY WITH PROPOFOL N/A 11/15/2017   Procedure: COLONOSCOPY WITH PROPOFOL;  Surgeon: Daneil Dolin, MD;  [facility];  Service: Endoscopy;  Laterality: N/A;  12:30pm  . ESOPHAGOGASTRODUODENOSCOPY (EGD) WITH PROPOFOL N/A 10/05/2016   Procedure: ESOPHAGOGASTRODUODENOSCOPY (EGD) WITH PROPOFOL;  Surgeon: Daneil Dolin, MD;  [facility];  Service: Endoscopy;  Laterality: N/A;  . ESOPHAGOGASTRODUODENOSCOPY (EGD) WITH PROPOFOL N/A 02/15/2017   Procedure: ESOPHAGOGASTRODUODENOSCOPY (EGD) WITH PROPOFOL;  Surgeon: Daneil Dolin, MD;  [facility];  Service: Endoscopy;  Laterality: N/A;  1115  . INGUINAL HERNIA REPAIR Right 08/18/2016   Procedure: RIGHT INGUINAL HERNORRHAPHY WITH MESH;  Surgeon: Aviva Signs, MD;  Location: [facility];  Service: General;  Laterality: Right;  . JOINT REPLACEMENT Right    hip   . POLYPECTOMY  11/15/2017   Procedure: POLYPECTOMY;  Surgeon: Daneil Dolin, MD;  [facility];  Service: Endoscopy;;  polyp cs splenic flexure times 2,  cecal polyp  . SHOULDER SURGERY Right    x 3     Social History   Socioeconomic History  . Marital status: Single    Spouse name: Not on  file  . Number of children: Not on file  . Years of education: Not on file  . Highest education level: Not on file  Occupational History  . Occupation: disabled  Tobacco Use  . Smoking status: Current Some Day Smoker    Packs/day: 0.10    Years: 47.00    Pack years: 4.70    Types: Cigarettes  . Smokeless tobacco: Never Used  Substance and Sexual Activity  . Alcohol use: No  . Drug use: Yes    Types: Marijuana  . Sexual activity: Not Currently  Other Topics Concern  . Not on file  Social History Narrative  . Not on file   Social  Determinants of Health   Financial Resource Strain: Not on file  Food Insecurity: Not on file  Transportation Needs: Not on file  Physical Activity: Not on file  Stress: Not on file  Social Connections: Not on file  Intimate Partner Violence: Not on file   Family History  Problem Relation Age of Onset  . Prostate cancer Father   . Prostate cancer Brother   . Colon cancer Neg Hx     Current Outpatient Medications  Medication Sig Dispense Refill  . amLODipine (NORVASC) 10 MG tablet Take 10 mg by mouth daily.    . atorvastatin (LIPITOR) 20 MG tablet Take 20 mg by mouth daily.    . losartan-hydrochlorothiazide (HYZAAR) 100-25 MG tablet Take 1 tablet by mouth daily.    . pantoprazole (PROTONIX) 40 MG tablet Take 1 tablet (40 mg total) by mouth daily. 90 tablet 3   No current facility-administered medications for this visit.    Allergies  Allergen Reactions  . Nsaids Other (See Comments)    Not to take - "busted intestines open"     REVIEW OF SYSTEMS:   [X] denotes positive finding, [ ] denotes negative finding Cardiac  Comments:  Chest pain or chest pressure:    Shortness of breath upon exertion:    Short of breath when lying flat:    Irregular heart rhythm:        Vascular    Pain in calf, thigh, or hip brought on by ambulation: x   Pain in feet at night that wakes you up from your sleep:     Blood clot in your veins:    Leg swelling:         Pulmonary    Oxygen at home:    Productive cough:     Wheezing:         Neurologic    Sudden weakness in arms or legs:     Sudden numbness in arms or legs:     Sudden onset of difficulty speaking or slurred speech:    Temporary loss of vision in one eye:     Problems with dizziness:         Gastrointestinal    Blood in stool:     Vomited blood:         Genitourinary    Burning when urinating:     Blood in urine:        Psychiatric    Major depression:         Hematologic    Bleeding problems:    Problems with  blood clotting too easily:        Skin    Rashes or ulcers:        Constitutional    Fever or chills:      PHYSICAL EXAMINATION: Vitals:     10/08/20 1113  BP: 131/80  Pulse: 73  Resp: 20  Temp: 98.2 F (36.8 C)  SpO2: 98%    General:  WDWN in NAD; vital signs documented above Gait: Unaided, no ataxia HENT: WNL, normocephalic Pulmonary: normal non-labored breathing , without Rales, rhonchi,  wheezing Cardiac: regular HR, without  Murmurs without carotid bruit Abdomen: soft, NT, no masses Skin: without rashes Vascular Exam/Pulses: 2+ bilateral radial and femoral pulses.  1+ left dorsalis pedis pulse.  I cannot palpate right pedal pulses. Extremities: without acute ischemic changes, without Gangrene , without cellulitis; without open wounds;  Musculoskeletal: no muscle wasting or atrophy  Neurologic: A&O X 3;  No focal weakness or paresthesias are detected Psychiatric:  The pt has Normal affect.   Non-Invasive Vascular Imaging:   10/08/2020 Bilateral ABIs appear essentially unchanged compared to prior study on  03/11/2020.    Summary:  Right: Resting right ankle-brachial index indicates moderate right lower  extremity arterial disease.  The right toe-brachial index is absent. Waveforms are monophasic  Left: Resting left ankle-brachial index indicates mild left lower  extremity arterial disease.  The left toe-brachial index is normal. Waveforms are biphasic and triphasic.    ASSESSMENT/PLAN:: 65 y.o. male here for follow up for bilateral lower extremity claudication.  Unable to obtain toe pressure on the right today.  The patient feels as though his symptoms are progressing.  I discussed risks and rationale of arteriogram.  I discussed with the patient and his sister that risks include, but are not limited to, IV contrast reaction, kidney injury secondary to IV contrast, bleeding, hematoma or pseudoaneurysm of the femoral artery.  He is in agreement with proceeding on  Monday, March 14th with Dr. Donzetta Matters.  Patient complaining of right upper extremity numbness.  Will obtain carotid ultrasound and refer him back to Dr. Sherwood Gambler for cervical spine evaluation.  Strongly encouraged the patient to stop all tobacco products immediately.  Continue good blood pressure control and monitoring with primary care physician.  Addendum 10/11/2020: Carotid duplex Right Carotid: Velocities in the right ICA are consistent with a 1-39% stenosis. Nonhemodynamically significant plaque <50% noted in the CCA. Left Carotid: Velocities in the left ICA are consistent with a 40-59% stenosis. Nonhemodynamically significant plaque <50% noted in the CCA.  Vertebrals: Bilateral vertebral arteries demonstrate antegrade flow. Subclavians: Normal flow hemodynamics were seen in bilateral subclavian arteries.  Barbie Banner, PA-C [facility] [phone]  Clinic MD:   Dr. Stanford Breed on-call

## 2020-10-11 ENCOUNTER — Other Ambulatory Visit: Payer: Self-pay

## 2020-10-11 ENCOUNTER — Ambulatory Visit (HOSPITAL_COMMUNITY)
Admission: RE | Admit: 2020-10-11 | Discharge: 2020-10-11 | Disposition: A | Discharge status: Home / Self Care | Payer: Medicare HMO | Source: Ambulatory Visit | Attending: Vascular Surgery | Admitting: Vascular Surgery

## 2020-10-11 DIAGNOSIS — R200 Anesthesia of skin: Secondary | ICD-10-CM | POA: Insufficient documentation

## 2020-10-14 ENCOUNTER — Encounter: Payer: Self-pay | Admitting: Internal Medicine

## 2020-10-15 ENCOUNTER — Other Ambulatory Visit (HOSPITAL_COMMUNITY)
Admission: RE | Admit: 2020-10-15 | Discharge: 2020-10-15 | Disposition: A | Discharge status: Home / Self Care | Payer: Medicare HMO | Source: Ambulatory Visit | Attending: Vascular Surgery | Admitting: Vascular Surgery

## 2020-10-15 DIAGNOSIS — Z01812 Encounter for preprocedural laboratory examination: Secondary | ICD-10-CM | POA: Diagnosis present

## 2020-10-15 DIAGNOSIS — Z20822 Contact with and (suspected) exposure to covid-19: Secondary | ICD-10-CM | POA: Diagnosis not present

## 2020-10-15 LAB — SARS CORONAVIRUS 2 (TAT 6-24 HRS): SARS Coronavirus 2: NEGATIVE

## 2020-10-18 ENCOUNTER — Encounter (HOSPITAL_COMMUNITY)
Admission: RE | Disposition: A | Discharge status: Home / Self Care | Payer: Self-pay | Source: Home / Self Care | Attending: Vascular Surgery

## 2020-10-18 ENCOUNTER — Other Ambulatory Visit: Payer: Self-pay

## 2020-10-18 ENCOUNTER — Ambulatory Visit (HOSPITAL_COMMUNITY)
Admission: RE | Admit: 2020-10-18 | Discharge: 2020-10-18 | Disposition: A | Discharge status: Home / Self Care | Payer: Medicare HMO | Attending: Vascular Surgery | Admitting: Vascular Surgery

## 2020-10-18 DIAGNOSIS — I70213 Atherosclerosis of native arteries of extremities with intermittent claudication, bilateral legs: Secondary | ICD-10-CM | POA: Insufficient documentation

## 2020-10-18 DIAGNOSIS — I6523 Occlusion and stenosis of bilateral carotid arteries: Secondary | ICD-10-CM | POA: Insufficient documentation

## 2020-10-18 DIAGNOSIS — Z886 Allergy status to analgesic agent status: Secondary | ICD-10-CM | POA: Insufficient documentation

## 2020-10-18 DIAGNOSIS — F17210 Nicotine dependence, cigarettes, uncomplicated: Secondary | ICD-10-CM | POA: Insufficient documentation

## 2020-10-18 DIAGNOSIS — R200 Anesthesia of skin: Secondary | ICD-10-CM | POA: Insufficient documentation

## 2020-10-18 DIAGNOSIS — I10 Essential (primary) hypertension: Secondary | ICD-10-CM | POA: Diagnosis not present

## 2020-10-18 DIAGNOSIS — Z79899 Other long term (current) drug therapy: Secondary | ICD-10-CM | POA: Insufficient documentation

## 2020-10-18 DIAGNOSIS — I70211 Atherosclerosis of native arteries of extremities with intermittent claudication, right leg: Secondary | ICD-10-CM | POA: Diagnosis not present

## 2020-10-18 HISTORY — PX: PERIPHERAL VASCULAR INTERVENTION: CATH118257

## 2020-10-18 HISTORY — PX: ABDOMINAL AORTOGRAM W/LOWER EXTREMITY: CATH118223

## 2020-10-18 LAB — POCT I-STAT, CHEM 8
BUN: 9 mg/dL (ref 8–23)
Calcium, Ion: 1.21 mmol/L (ref 1.15–1.40)
Chloride: 98 mmol/L (ref 98–111)
Creatinine, Ser: 1.3 mg/dL — ABNORMAL HIGH (ref 0.61–1.24)
Glucose, Bld: 86 mg/dL (ref 70–99)
HCT: 44 % (ref 39.0–52.0)
Hemoglobin: 15 g/dL (ref 13.0–17.0)
Potassium: 3.7 mmol/L (ref 3.5–5.1)
Sodium: 138 mmol/L (ref 135–145)
TCO2: 29 mmol/L (ref 22–32)

## 2020-10-18 SURGERY — ABDOMINAL AORTOGRAM W/LOWER EXTREMITY
Anesthesia: LOCAL | Laterality: Right

## 2020-10-18 MED ORDER — OXYCODONE HCL 5 MG PO TABS: 5.0000 mg | ORAL_TABLET | ORAL | Status: DC | PRN

## 2020-10-18 MED ORDER — CLOPIDOGREL BISULFATE 300 MG PO TABS
ORAL_TABLET | ORAL | Status: DC | PRN
  Administered 2020-10-18: 300 mg via ORAL

## 2020-10-18 MED ORDER — HEPARIN (PORCINE) IN NACL 1000-0.9 UT/500ML-% IV SOLN
INTRAVENOUS | Status: AC
  Filled 2020-10-18: qty 500

## 2020-10-18 MED ORDER — LIDOCAINE HCL (PF) 1 % IJ SOLN
INTRAMUSCULAR | Status: DC | PRN
  Administered 2020-10-18: 15 mL via INTRADERMAL

## 2020-10-18 MED ORDER — CLOPIDOGREL BISULFATE 300 MG PO TABS
ORAL_TABLET | ORAL | Status: AC
  Filled 2020-10-18: qty 1

## 2020-10-18 MED ORDER — MIDAZOLAM HCL 2 MG/2ML IJ SOLN
INTRAMUSCULAR | Status: AC
  Filled 2020-10-18: qty 2

## 2020-10-18 MED ORDER — SODIUM CHLORIDE 0.9 % IV SOLN: INTRAVENOUS | Status: DC

## 2020-10-18 MED ORDER — LABETALOL HCL 5 MG/ML IV SOLN: 10.0000 mg | INTRAVENOUS | Status: DC | PRN

## 2020-10-18 MED ORDER — LIDOCAINE HCL (PF) 1 % IJ SOLN
INTRAMUSCULAR | Status: AC
  Filled 2020-10-18: qty 30

## 2020-10-18 MED ORDER — FENTANYL CITRATE (PF) 100 MCG/2ML IJ SOLN
INTRAMUSCULAR | Status: AC
  Filled 2020-10-18: qty 2

## 2020-10-18 MED ORDER — HEPARIN (PORCINE) IN NACL 1000-0.9 UT/500ML-% IV SOLN
INTRAVENOUS | Status: DC | PRN
  Administered 2020-10-18 (×2): 500 mL

## 2020-10-18 MED ORDER — SODIUM CHLORIDE 0.9% FLUSH: 3.0000 mL | INTRAVENOUS | Status: DC | PRN

## 2020-10-18 MED ORDER — SODIUM CHLORIDE 0.9 % IV SOLN: 250.0000 mL | INTRAVENOUS | Status: DC | PRN

## 2020-10-18 MED ORDER — ONDANSETRON HCL 4 MG/2ML IJ SOLN: 4.0000 mg | Freq: Four times a day (QID) | INTRAMUSCULAR | Status: DC | PRN

## 2020-10-18 MED ORDER — IODIXANOL 320 MG/ML IV SOLN
INTRAVENOUS | Status: DC | PRN
  Administered 2020-10-18: 160 mL

## 2020-10-18 MED ORDER — CLOPIDOGREL BISULFATE 75 MG PO TABS: 75.0000 mg | ORAL_TABLET | Freq: Every day | ORAL | 11 refills | Status: DC

## 2020-10-18 MED ORDER — HEPARIN SODIUM (PORCINE) 1000 UNIT/ML IJ SOLN
INTRAMUSCULAR | Status: AC
  Filled 2020-10-18: qty 1

## 2020-10-18 MED ORDER — HEPARIN SODIUM (PORCINE) 1000 UNIT/ML IJ SOLN
INTRAMUSCULAR | Status: DC | PRN
  Administered 2020-10-18: 7000 [IU] via INTRAVENOUS

## 2020-10-18 MED ORDER — ACETAMINOPHEN 325 MG PO TABS
650.0000 mg | ORAL_TABLET | ORAL | Status: DC | PRN
Start: 2020-10-18 — End: 2020-10-18

## 2020-10-18 MED ORDER — SODIUM CHLORIDE 0.9 % WEIGHT BASED INFUSION: 1.0000 mL/kg/h | INTRAVENOUS | Status: DC

## 2020-10-18 MED ORDER — CLOPIDOGREL BISULFATE 75 MG PO TABS: 300.0000 mg | ORAL_TABLET | Freq: Once | ORAL | Status: DC

## 2020-10-18 MED ORDER — SODIUM CHLORIDE 0.9% FLUSH: 3.0000 mL | Freq: Two times a day (BID) | INTRAVENOUS | Status: DC

## 2020-10-18 MED ORDER — MIDAZOLAM HCL 2 MG/2ML IJ SOLN
INTRAMUSCULAR | Status: DC | PRN
  Administered 2020-10-18: 1 mg via INTRAVENOUS

## 2020-10-18 MED ORDER — HYDRALAZINE HCL 20 MG/ML IJ SOLN: 5.0000 mg | INTRAMUSCULAR | Status: DC | PRN

## 2020-10-18 MED ORDER — CLOPIDOGREL BISULFATE 75 MG PO TABS: 75.0000 mg | ORAL_TABLET | Freq: Every day | ORAL | Status: DC

## 2020-10-18 MED ORDER — FENTANYL CITRATE (PF) 100 MCG/2ML IJ SOLN
INTRAMUSCULAR | Status: DC | PRN
  Administered 2020-10-18: 50 ug via INTRAVENOUS

## 2020-10-18 SURGICAL SUPPLY — 21 items
BALLN CHOCOLATE 5.0X40X120 (BALLOONS) ×3
BALLN MUSTANG 5X120X135 (BALLOONS) ×3
BALLOON CHOCOLATE 5.0X40X120 (BALLOONS) ×2 IMPLANT
BALLOON MUSTANG 5X120X135 (BALLOONS) ×2 IMPLANT
CATH OMNI FLUSH 5F 65CM (CATHETERS) ×3 IMPLANT
CATH QUICKCROSS SUPP .035X90CM (MICROCATHETER) ×3 IMPLANT
CLOSURE MYNX CONTROL 6F/7F (Vascular Products) ×3 IMPLANT
GLIDEWIRE ADV .035X260CM (WIRE) ×3 IMPLANT
KIT ENCORE 26 ADVANTAGE (KITS) ×3 IMPLANT
KIT MICROPUNCTURE NIT STIFF (SHEATH) ×3 IMPLANT
KIT PV (KITS) ×3 IMPLANT
SHEATH FLEX ANSEL ANG 6F 45CM (SHEATH) ×3 IMPLANT
SHEATH PINNACLE 5F 10CM (SHEATH) ×3 IMPLANT
SHEATH PINNACLE 6F 10CM (SHEATH) ×3 IMPLANT
SHEATH PROBE COVER 6X72 (BAG) ×3 IMPLANT
STENT ELUVIA 6X120X130 (Permanent Stent) ×3 IMPLANT
SYR MEDRAD MARK V 150ML (SYRINGE) ×3 IMPLANT
TRANSDUCER W/STOPCOCK (MISCELLANEOUS) ×3 IMPLANT
TRAY PV CATH (CUSTOM PROCEDURE TRAY) ×3 IMPLANT
WIRE SPARTACORE .014X300CM (WIRE) ×3 IMPLANT
WIRE STARTER BENTSON 035X150 (WIRE) ×3 IMPLANT

## 2020-10-18 NOTE — Op Note (Signed)
    Patient name: Stephen Sullivan MRN: [id number] DOB: 1955/12/13 Sex: male  10/18/2020 Pre-operative Diagnosis: right lower extremity claudication Post-operative diagnosis:  Same Surgeon:  Erlene Quan C. Donzetta Matters, MD Procedure Performed: 1.  Ultrasound-guided cannulation left common femoral artery 2.  Aortogram with bilateral lower extremity runoff 3.  Stent of right SFA with 6 x 120 mm balloon via 4.  Chocolate balloon angioplasty right common femoral artery with 5 x 40 mm balloon 5.  Moderate sedation with fentanyl and Versed for 53 minutes 6.  Mynx device closure left common femoral artery   Indications: 65 year old male with right lower extremity greater than left lower extremity claudication.  He has decreased ABI on the right is indicated for angiography from left common femoral approach.  Findings: Aorta and iliac segments are free of flow-limiting stenosis.  Right external iliac artery leading to the common femoral artery does appear to have a flow-limiting stenosis.  This is approximately 60%.  After topical balloon angioplasty this was reduced to approximately 20% there does appear to possibly be a small dissection channel that was there both preprocedure and at completion.  The SFA is occluded 114 mm.  120 mm stent was placed there was no residual stenosis and runoff via all 3 vessels.  Left lower extremity has no flow-limiting stenosis.   Procedure:  The patient was identified in the holding area and taken to room 8.  The patient was then placed supine on the table and prepped and draped in the usual sterile fashion.  A time out was called.  Ultrasound was used to evaluate the left common femoral artery which was noted be patent.  The area was anesthetized 1% lidocaine cannulated with micropuncture needle followed by wire and sheath.  This was done with direct ultrasound visualization and an image was saved to the permanent record.  A Bentson wire was placed followed by 5 [place] sheath.   Omni catheter was placed to the level of L1 and aortogram performed followed bilateral extremity runoff.  With the above findings we then crossed the bifurcation with Bentson wire and Omni catheter.  We performed angled angiography of the right common femoral artery where we elected we would need to intervene on the distal external leading in the common femoral artery.  We then went up and over with a long 6 [place] sheath.  We used Glidewire advantage and quick cross catheter across the occluded segment confirm intraluminal access.  This was primarily stented with drug-eluting stent.  This was postdilated with 5 mm balloon.  Completion demonstrated no residual stenosis were previously was occluded.  We then retracted our sheath.  We performed primary balloon angioplasty with a chocolate balloon of the common femoral artery into the external leg artery on the right.  Completion demonstrated less than 20% residual stenosis.  Satisfied we exchanged for short 6 [place] sheath deployed a minx device.  He tolerated procedure without any complication  Contrast: 935TS  Brandon C. Donzetta Matters, MD [facility] [place] Office: [phone] Pager: [phone]

## 2020-10-18 NOTE — Discharge Instructions (Signed)
DRINK PLENTY OF FLUIDS OVER THE NEXT 2-3 DAYS. Femoral Site Care  This sheet gives you information about how to care for yourself after your procedure. Your health care provider may also give you more specific instructions. If you have problems or questions, contact your health care provider. What can I expect after the procedure? After the procedure, it is common to have:  Bruising that usually fades within 1-2 weeks.  Tenderness at the site. Follow these instructions at home: Wound care  Follow instructions from your health care provider about how to take care of your insertion site. Make sure you: ? Wash your hands with soap and water before you change your bandage (dressing). If soap and water are not available, use hand sanitizer. ? Change your dressing as told by your health care provider. ? Leave stitches (sutures), skin glue, or adhesive strips in place. These skin closures may need to stay in place for 2 weeks or longer. If adhesive strip edges start to loosen and curl up, you may trim the loose edges. Do not remove adhesive strips completely unless your health care provider tells you to do that.  Do not take baths, swim, or use a hot tub until your health care provider approves.  You may shower 24-48 hours after the procedure or as told by your health care provider. ? Gently wash the site with plain soap and water. ? Pat the area dry with a clean towel. ? Do not rub the site. This may cause bleeding.  Do not apply powder or lotion to the site. Keep the site clean and dry.  Check your femoral site every day for signs of infection. Check for: ? Redness, swelling, or pain. ? Fluid or blood. ? Warmth. ? Pus or a bad smell. Activity  For the first 2-3 days after your procedure, or as long as directed: ? Avoid climbing stairs as much as possible. ? Do not squat.  Do not lift anything that is heavier than 10 lb (4.5 kg), or the limit that you are told, until your health  care provider says that it is safe.  Rest as directed. ? Avoid sitting for a long time without moving. Get up to take short walks every 1-2 hours.  Do not drive for 24 hours if you were given a medicine to help you relax (sedative). General instructions  Take over-the-counter and prescription medicines only as told by your health care provider.  Keep all follow-up visits as told by your health care provider. This is important. Contact a health care provider if you have:  A fever or chills.  You have redness, swelling, or pain around your insertion site. Get help right away if:  The catheter insertion area swells very fast.  You pass out.  You suddenly start to sweat or your skin gets clammy.  The catheter insertion area is bleeding, and the bleeding does not stop when you hold steady pressure on the area.  The area near or just beyond the catheter insertion site becomes pale, cool, tingly, or numb. These symptoms may represent a serious problem that is an emergency. Do not wait to see if the symptoms will go away. Get medical help right away. Call your local emergency services (911 in the [place] Do not drive yourself to the hospital. Summary  After the procedure, it is common to have bruising that usually fades within 1-2 weeks.  Check your femoral site every day for signs of infection.  Do not lift anything   that is heavier than 10 lb (4.5 kg), or the limit that you are told, until your health care provider says that it is safe. This information is not intended to replace advice given to you by your health care provider. Make sure you discuss any questions you have with your health care provider. Document Revised: 03/26/2020 Document Reviewed: 03/26/2020 Elsevier Patient Education  2021 Elsevier Inc.  

## 2020-10-18 NOTE — Interval H&P Note (Signed)
History and Physical Interval Note:  10/18/2020 11:07 AM  Stephen Sullivan  has presented today for surgery, with the diagnosis of claudication.  The various methods of treatment have been discussed with the patient and family. After consideration of risks, benefits and other options for treatment, the patient has consented to  Procedure(s): ABDOMINAL AORTOGRAM W/LOWER EXTREMITY (N/A) as a surgical intervention.  The patient's history has been reviewed, patient examined, no change in status, stable for surgery.  I have reviewed the patient's chart and labs.  Questions were answered to the patient's satisfaction.     Servando Snare

## 2020-10-19 ENCOUNTER — Encounter (HOSPITAL_COMMUNITY): Payer: Self-pay | Admitting: Vascular Surgery

## 2020-11-08 ENCOUNTER — Telehealth (HOSPITAL_COMMUNITY): Payer: Self-pay

## 2020-11-08 NOTE — Telephone Encounter (Signed)
Patient came by and signed medical release for CD of his Carotid. It was burnt and placed at the front desk, patient is aware it is ready for pickup.    [facility]

## 2020-11-09 ENCOUNTER — Other Ambulatory Visit: Payer: Self-pay | Admitting: Neurological Surgery

## 2020-11-09 DIAGNOSIS — M5412 Radiculopathy, cervical region: Secondary | ICD-10-CM

## 2020-11-12 ENCOUNTER — Other Ambulatory Visit: Payer: Self-pay

## 2020-11-12 ENCOUNTER — Ambulatory Visit
Admission: RE | Admit: 2020-11-12 | Discharge: 2020-11-12 | Disposition: A | Discharge status: Home / Self Care | Payer: Medicare HMO | Source: Ambulatory Visit | Attending: Neurological Surgery | Admitting: Neurological Surgery

## 2020-11-12 DIAGNOSIS — M5412 Radiculopathy, cervical region: Secondary | ICD-10-CM

## 2020-11-13 ENCOUNTER — Other Ambulatory Visit: Payer: Self-pay

## 2020-11-13 DIAGNOSIS — I739 Peripheral vascular disease, unspecified: Secondary | ICD-10-CM

## 2020-11-23 ENCOUNTER — Telehealth: Payer: Self-pay

## 2020-11-23 NOTE — Telephone Encounter (Signed)
Pt called with c/o pulling/straining something the R of his pubic area after a "bad bowel movement" several days ago. He said it is where his original hernia was. He has been advised to call his PCP to get an appt this week with them and he will call us back if he needs further assistance. Pt verbalized understanding.

## 2020-12-03 ENCOUNTER — Other Ambulatory Visit: Payer: Self-pay

## 2020-12-03 ENCOUNTER — Ambulatory Visit (INDEPENDENT_AMBULATORY_CARE_PROVIDER_SITE_OTHER)
Admission: RE | Admit: 2020-12-03 | Discharge: 2020-12-03 | Disposition: A | Discharge status: Home / Self Care | Payer: Medicare HMO | Source: Ambulatory Visit | Attending: Vascular Surgery | Admitting: Vascular Surgery

## 2020-12-03 ENCOUNTER — Ambulatory Visit (HOSPITAL_COMMUNITY)
Admission: RE | Admit: 2020-12-03 | Discharge: 2020-12-03 | Disposition: A | Discharge status: Home / Self Care | Payer: Medicare HMO | Source: Ambulatory Visit | Attending: Vascular Surgery | Admitting: Vascular Surgery

## 2020-12-03 ENCOUNTER — Ambulatory Visit (INDEPENDENT_AMBULATORY_CARE_PROVIDER_SITE_OTHER): Payer: Medicare HMO | Admitting: Physician Assistant

## 2020-12-03 VITALS — BP 130/84 | HR 75 | Temp 98.4°F | Resp 20 | Ht 71.0 in | Wt 160.4 lb

## 2020-12-03 DIAGNOSIS — I739 Peripheral vascular disease, unspecified: Secondary | ICD-10-CM | POA: Diagnosis present

## 2020-12-03 NOTE — Progress Notes (Signed)
History of Present Illness:  Patient is a 65 y.o. year old male who presents for evaluation of PAD s/p angiogram with right SFA stent placement and balloon angioplasty.  He has a history of right > left claudication symptoms.    He states his right foot feels better and he is able to walk better and further on a daily basis.  He denise rest pain and non healing wounds.  Past Medical History:  Diagnosis Date  . Arthritis   . Back pain   . Cataracts, bilateral    immature  . GERD (gastroesophageal reflux disease)    using Baking Soda and Water per pt  . Headache(784.0)    related to neck issues  . History of bronchitis   . History of gastric ulcer 20+yrs ago  . Hypertension    takes Benazepril some days  . Joint pain   . Muscle spasm    takes Flexeril daily  . Shortness of breath    with exertion  . Weakness    and numbness in left fingers    Past Surgical History:  Procedure Laterality Date  . ABDOMINAL AORTOGRAM W/LOWER EXTREMITY N/A 10/18/2020   Procedure: ABDOMINAL AORTOGRAM W/LOWER EXTREMITY;  Surgeon: Waynetta Sandy, MD;  Location: [facility];  Service: Cardiovascular;  Laterality: N/A;  . ANTERIOR CERVICAL DECOMP/DISCECTOMY FUSION N/A 12/18/2013   Procedure: ANTERIOR CERVICAL DECOMPRESSION/DISCECTOMY FUSION CERVICAL THREE-FOUR, FOUR-FIVE, FIVE-SIX W/ BONEGRAFT;  Surgeon: Hosie Spangle, MD;  Location: [facility];  Service: Neurosurgery;  Laterality: N/A;  ANTERIOR CERVICAL DECOMPRESSION/DISCECTOMY FUSION CERVICAL THREE-FOUR, FOUR-FIVE, FIVE-SIX W/ BONEGRAFT  . arm surgery Left   . COLONOSCOPY WITH PROPOFOL N/A 11/15/2017   Procedure: COLONOSCOPY WITH PROPOFOL;  Surgeon: Daneil Dolin, MD;  [facility];  Service: Endoscopy;  Laterality: N/A;  12:30pm  . ESOPHAGOGASTRODUODENOSCOPY (EGD) WITH PROPOFOL N/A 10/05/2016   Procedure: ESOPHAGOGASTRODUODENOSCOPY (EGD) WITH PROPOFOL;  Surgeon: Daneil Dolin, MD;  [facility];   Service: Endoscopy;  Laterality: N/A;  . ESOPHAGOGASTRODUODENOSCOPY (EGD) WITH PROPOFOL N/A 02/15/2017   Procedure: ESOPHAGOGASTRODUODENOSCOPY (EGD) WITH PROPOFOL;  Surgeon: Daneil Dolin, MD;  [facility];  Service: Endoscopy;  Laterality: N/A;  1115  . INGUINAL HERNIA REPAIR Right 08/18/2016   Procedure: RIGHT INGUINAL HERNORRHAPHY WITH MESH;  Surgeon: Aviva Signs, MD;  [facility];  Service: General;  Laterality: Right;  . JOINT REPLACEMENT Right    hip   . PERIPHERAL VASCULAR INTERVENTION Right 10/18/2020   Procedure: PERIPHERAL VASCULAR INTERVENTION;  Surgeon: Waynetta Sandy, MD;  Location: [facility];  Service: Cardiovascular;  Laterality: Right;  . POLYPECTOMY  11/15/2017   Procedure: POLYPECTOMY;  Surgeon: Daneil Dolin, MD;  [facility];  Service: Endoscopy;;  polyp cs splenic flexure times 2,  cecal polyp  . SHOULDER SURGERY Right    x 3     ROS:   General:  No weight loss, Fever, chills  HEENT: No recent headaches, no nasal bleeding, no visual changes, no sore throat  Neurologic: No dizziness, blackouts, seizures. No recent symptoms of stroke or mini- stroke. No recent episodes of slurred speech, or temporary blindness.  Cardiac: No recent episodes of chest pain/pressure, no shortness of breath at rest.  No shortness of breath with exertion.  Denies history of atrial fibrillation or irregular heartbeat  Vascular: No history of rest pain in feet.  No history of claudication.  No history of non-healing ulcer, No history of  DVT   Pulmonary: No home oxygen, no productive cough, no hemoptysis,  No asthma or wheezing  Musculoskeletal:  [ ]  Arthritis, [ ]  Low back pain,  [ ]  Joint pain  Hematologic:No history of hypercoagulable state.  No history of easy bleeding.  No history of anemia  Gastrointestinal: No hematochezia or melena,  No gastroesophageal reflux, no trouble swallowing  Urinary: [ ]  chronic Kidney disease, [ ]  on HD -  [ ]  MWF or [ ]  TTHS, [ ]  Burning with urination, [ ]  Frequent urination, [ ]  Difficulty urinating;   Skin: No rashes  Psychological: No history of anxiety,  No history of depression  Social History Social History   Tobacco Use  . Smoking status: Current Some Day Smoker    Packs/day: 0.10    Years: 47.00    Pack years: 4.70    Types: Cigarettes  . Smokeless tobacco: Never Used  Substance Use Topics  . Alcohol use: No  . Drug use: Yes    Types: Marijuana    Family History Family History  Problem Relation Age of Onset  . Prostate cancer Father   . Prostate cancer Brother   . Colon cancer Neg Hx     Allergies  Allergies  Allergen Reactions  . Nsaids Other (See Comments)    Not to take - "busted intestines open"     Current Outpatient Medications  Medication Sig Dispense Refill  . amLODipine (NORVASC) 10 MG tablet Take 10 mg by mouth daily.    Marland Kitchen atorvastatin (LIPITOR) 20 MG tablet Take 20 mg by mouth daily.    . clopidogrel (PLAVIX) 75 MG tablet Take 1 tablet (75 mg total) by mouth daily. 30 tablet 11  . losartan (COZAAR) 25 MG tablet     . losartan-hydrochlorothiazide (HYZAAR) 100-25 MG tablet Take 1 tablet by mouth daily.    . pantoprazole (PROTONIX) 40 MG tablet Take 1 tablet (40 mg total) by mouth daily. (Patient taking differently: Take 80 mg by mouth daily.) 90 tablet 3   No current facility-administered medications for this visit.    Physical Examination  Vitals:   12/03/20 1506  BP: 130/84  Pulse: 75  Resp: 20  Temp: 98.4 F (36.9 C)  TempSrc: Temporal  SpO2: 99%  Weight: 160 lb 6.4 oz (72.8 kg)  Height: 5\' 11"  (1.803 m)    Body mass index is 22.37 kg/m.  General:  Alert and oriented, no acute distress HEENT: Normal Neck: No bruit or JVD Pulmonary: Clear to auscultation bilaterally Cardiac: Regular Rate and Rhythm without murmur Abdomen: Soft, non-tender, non-distended, no mass, no scars Skin: No rash Extremity Pulses:  2+ radial,  brachial, femoral, dorsalis pedis, posterior tibial pulses bilaterally Musculoskeletal: No deformity or edema  Neurologic: Upper and lower extremity motor 5/5 and symmetric  DATA:  Right LE arterial duplex shows patent stent without stenosis of the SFA ABI's reveal Triphasic and biphasic arterial flow B LE   ASSESSMENT:  PAD with right > left LE claudication symptoms. He has palpable pedal pulses B LE with improved motor function in the right LE.   PLAN: He will stay as active as he tolerates and increase his walking on a daily basis.   F/U in 9 months with repeat arterial duplex and ABI's.  He he develops new symptoms he will call sooner.     Roxy Horseman PA-C [facility] [place] Office: [phone]  MD on call Trula Slade

## 2020-12-08 ENCOUNTER — Ambulatory Visit: Payer: Medicare HMO | Admitting: Gastroenterology

## 2020-12-08 ENCOUNTER — Other Ambulatory Visit: Payer: Self-pay

## 2020-12-08 DIAGNOSIS — I739 Peripheral vascular disease, unspecified: Secondary | ICD-10-CM

## 2021-07-21 ENCOUNTER — Emergency Department (HOSPITAL_COMMUNITY): Payer: Medicare HMO

## 2021-07-21 ENCOUNTER — Other Ambulatory Visit: Payer: Self-pay

## 2021-07-21 ENCOUNTER — Encounter (HOSPITAL_COMMUNITY): Payer: Self-pay

## 2021-07-21 ENCOUNTER — Emergency Department (HOSPITAL_COMMUNITY)
Admission: EM | Admit: 2021-07-21 | Discharge: 2021-07-21 | Disposition: A | Discharge status: Home / Self Care | Payer: Medicare HMO | Attending: Emergency Medicine | Admitting: Emergency Medicine

## 2021-07-21 DIAGNOSIS — Z79899 Other long term (current) drug therapy: Secondary | ICD-10-CM | POA: Diagnosis not present

## 2021-07-21 DIAGNOSIS — F17210 Nicotine dependence, cigarettes, uncomplicated: Secondary | ICD-10-CM | POA: Diagnosis not present

## 2021-07-21 DIAGNOSIS — I10 Essential (primary) hypertension: Secondary | ICD-10-CM | POA: Diagnosis not present

## 2021-07-21 DIAGNOSIS — Z96641 Presence of right artificial hip joint: Secondary | ICD-10-CM | POA: Diagnosis not present

## 2021-07-21 DIAGNOSIS — M25511 Pain in right shoulder: Secondary | ICD-10-CM | POA: Insufficient documentation

## 2021-07-21 DIAGNOSIS — M19019 Primary osteoarthritis, unspecified shoulder: Secondary | ICD-10-CM

## 2021-07-21 MED ORDER — CELECOXIB 200 MG PO CAPS: 200.0000 mg | ORAL_CAPSULE | Freq: Two times a day (BID) | ORAL | 0 refills | Status: DC

## 2021-07-21 NOTE — ED Triage Notes (Signed)
Patient with previous shoulder injury from August. States he has had 3 previous surgeries on this shoulder.

## 2021-07-21 NOTE — ED Provider Notes (Signed)
[facility] EMERGENCY DEPARTMENT Provider Note   CSN: [id number] Arrival date & time: 07/21/21  6644     History Chief Complaint  Patient presents with   Shoulder Injury    Stephen Sullivan is a 65 y.o. male who presents with right shoulder pain.  History of multiple surgeries on the right shoulder back in the 1980s.  Patient states that he was pulling a lawnmower cord back in August and developed significant pain in his right shoulder at that time.  Since that time he has been progressively worsening and since his [birth year] on [birth year] he states that the pain has become severe.  It wakes him from sleep.  He is unable to use the arm as his pain is severe with any range of motion of the right shoulder.  Patient states that he decided to come in for evaluation at this time.  He has been taking over-the-counter [place] medications without relief of symptoms.   Shoulder Injury      Past Medical History:  Diagnosis Date   Arthritis    Back pain    Cataracts, bilateral    immature   GERD (gastroesophageal reflux disease)    using Baking Soda and Water per pt   Headache(784.0)    related to neck issues   History of bronchitis    History of gastric ulcer 20+yrs ago   Hypertension    takes Benazepril some days   Joint pain    Muscle spasm    takes Flexeril daily   Shortness of breath    with exertion   Weakness    and numbness in left fingers    Patient Active Problem List   Diagnosis Date Noted   GERD (gastroesophageal reflux disease) 10/15/2017   Constipation 01/09/2017   Gastrointestinal hemorrhage    Acute gastric ulcer    Acute esophagitis    GI bleed 10/04/2016   Polysubstance abuse ([place]) 10/04/2016   Tobacco dependence 10/04/2016   Cervical spondylosis 12/18/2013   Adjustment disorder 12/20/2011    Past Surgical History:  Procedure Laterality Date   ABDOMINAL AORTOGRAM W/LOWER EXTREMITY N/A 10/18/2020   Procedure: ABDOMINAL AORTOGRAM W/LOWER EXTREMITY;   Surgeon: Waynetta Sandy, MD;  Location: [place] [facility];  Service: Cardiovascular;  Laterality: N/A;   ANTERIOR CERVICAL DECOMP/DISCECTOMY FUSION N/A 12/18/2013   Procedure: ANTERIOR CERVICAL DECOMPRESSION/DISCECTOMY FUSION CERVICAL THREE-FOUR, FOUR-FIVE, FIVE-SIX W/ BONEGRAFT;  Surgeon: Hosie Spangle, MD;  Location: [place] [facility];  Service: Neurosurgery;  Laterality: N/A;  ANTERIOR CERVICAL DECOMPRESSION/DISCECTOMY FUSION CERVICAL THREE-FOUR, FOUR-FIVE, FIVE-SIX W/ BONEGRAFT   arm surgery Left    COLONOSCOPY WITH PROPOFOL N/A 11/15/2017   Procedure: COLONOSCOPY WITH PROPOFOL;  Surgeon: Daneil Dolin, MD;  [facility];  Service: Endoscopy;  Laterality: N/A;  12:30pm   ESOPHAGOGASTRODUODENOSCOPY (EGD) WITH PROPOFOL N/A 10/05/2016   Procedure: ESOPHAGOGASTRODUODENOSCOPY (EGD) WITH PROPOFOL;  Surgeon: Daneil Dolin, MD;  [facility];  Service: Endoscopy;  Laterality: N/A;   ESOPHAGOGASTRODUODENOSCOPY (EGD) WITH PROPOFOL N/A 02/15/2017   Procedure: ESOPHAGOGASTRODUODENOSCOPY (EGD) WITH PROPOFOL;  Surgeon: Daneil Dolin, MD;  [facility];  Service: Endoscopy;  Laterality: N/A;  [place] Right 08/18/2016   Procedure: RIGHT INGUINAL HERNORRHAPHY WITH MESH;  Surgeon: Aviva Signs, MD;  Location: [facility];  Service: General;  Laterality: Right;   JOINT REPLACEMENT Right    hip    PERIPHERAL VASCULAR INTERVENTION Right 10/18/2020   Procedure: PERIPHERAL VASCULAR INTERVENTION;  Surgeon: Donzetta Matters,  Georgia Dom, MD;  Location: [place] [facility];  Service: Cardiovascular;  Laterality: Right;   POLYPECTOMY  11/15/2017   Procedure: POLYPECTOMY;  Surgeon: Daneil Dolin, MD;  [facility];  Service: Endoscopy;;  polyp cs splenic flexure times 2,  cecal polyp   SHOULDER SURGERY Right    x 3        Family History  Problem Relation Age of Onset   Prostate cancer Father    Prostate cancer Brother    Colon cancer Neg Hx      Social History   Tobacco Use   Smoking status: Some Days    Packs/day: 0.10    Years: 47.00    Pack years: 4.70    Types: Cigarettes   Smokeless tobacco: Never  Substance Use Topics   Alcohol use: No   Drug use: Yes    Types: Marijuana    Home Medications Prior to Admission medications   Medication Sig Start Date End Date Taking? Authorizing Provider  amLODipine (NORVASC) 10 MG tablet Take 10 mg by mouth daily. 07/12/16   [provider]  atorvastatin (LIPITOR) 20 MG tablet Take 20 mg by mouth daily. 09/23/20   [provider]  clopidogrel (PLAVIX) 75 MG tablet Take 1 tablet (75 mg total) by mouth daily. 10/18/20 10/18/21  Waynetta Sandy, MD  losartan (COZAAR) 25 MG tablet     [provider]  losartan-hydrochlorothiazide (HYZAAR) 100-25 MG tablet Take 1 tablet by mouth daily. 09/01/20   [provider]  pantoprazole (PROTONIX) 40 MG tablet Take 1 tablet (40 mg total) by mouth daily. Patient taking differently: Take 80 mg by mouth daily. 10/08/18   Carlis Stable, NP    Allergies    Nsaids  Review of Systems   Review of Systems  Musculoskeletal:  Positive for arthralgias.  Neurological:  Negative for weakness and numbness.   Physical Exam Updated Vital Signs BP 131/87 (BP Location: Right Arm)    Pulse 66    Temp 98 F (36.7 C) (Oral)    Resp 18    Ht 5\' 10"  (1.778 m)    Wt 74.8 kg    SpO2 100%    BMI 23.68 kg/m   Physical Exam Vitals and nursing note reviewed.  Constitutional:      General: He is not in acute distress.    Appearance: He is well-developed. He is not diaphoretic.  HENT:     Head: Normocephalic and atraumatic.  Eyes:     General: No scleral icterus.    Conjunctiva/sclera: Conjunctivae normal.  Cardiovascular:     Rate and Rhythm: Normal rate and regular rhythm.     Heart sounds: Normal heart sounds.  Pulmonary:     Effort: Pulmonary effort is normal. No respiratory distress.     Breath sounds: Normal  breath sounds.  Abdominal:     Palpations: Abdomen is soft.     Tenderness: There is no abdominal tenderness.  Musculoskeletal:     Right shoulder: Tenderness present. No swelling or bony tenderness. Decreased range of motion. Normal strength. Normal pulse.     Cervical back: Normal range of motion and neck supple.  Skin:    General: Skin is warm and dry.  Neurological:     Mental Status: He is alert.  Psychiatric:        Behavior: Behavior normal.    ED Results / Procedures / Treatments   Labs (all labs ordered are listed, but only abnormal results are  displayed) Labs Reviewed - No data to display  EKG None  Radiology DG Shoulder Right  Result Date: 07/21/2021 CLINICAL DATA:  Right shoulder pain EXAM: RIGHT SHOULDER - 2+ VIEW COMPARISON:  None. FINDINGS: No fracture or dislocation of the right shoulder. There is severe, bone-on-bone arthrosis of the glenohumeral joint. Anchors are present in the humeral neck and anterior glenoid. IMPRESSION: No fracture or dislocation of the right shoulder. There is severe, bone-on-bone arthrosis of the glenohumeral joint. Electronically Signed   By: Delanna Ahmadi M.D.   On: 07/21/2021 10:11    Procedures Procedures   Medications Ordered in ED Medications - No data to display  ED Course  I have reviewed the triage vital signs and the nursing notes.  Pertinent labs & imaging results that were available during my care of the patient were reviewed by me and considered in my medical decision making (see chart for details).    MDM Rules/Calculators/A&P                          Patient with severe shoulder pain.  Given sling for comfort but warned that he must remove his shoulder from the sling and do passive range of motion several times a day.  I have given him a handout on shoulder range of motion exercises.  Allergies list NSAIDs due to history of GI bleed however I feel that he would do well with Celebrex which should not cause any  significant stomach irritation.  Patient will be discharged with a short course of anti-inflammatories.  Given referral to orthopedics for close outpatient follow-up and evaluation.  Final Clinical Impression(s) / ED Diagnoses Final diagnoses:  None    Rx / DC Orders ED Discharge Orders     None        Margarita Mail, PA-C 07/21/21 1126    Milton Ferguson, MD 07/22/21 2337

## 2021-07-21 NOTE — ED Notes (Signed)
[facility] called for pt transportation back home. [phone]

## 2021-07-21 NOTE — Discharge Instructions (Addendum)
Contact a health care provider if: You have redness, swelling, or a feeling of warmth in a joint that gets worse. You have a fever along with joint or muscle aches. You develop a rash. You have trouble doing your normal activities. Get help right away if: You have pain that gets worse and is not relieved by pain medicine.

## 2021-07-27 ENCOUNTER — Other Ambulatory Visit: Payer: Self-pay

## 2021-07-27 ENCOUNTER — Ambulatory Visit: Payer: Medicare HMO | Admitting: Orthopedic Surgery

## 2021-07-27 VITALS — BP 147/76 | HR 75 | Ht 70.0 in | Wt 170.0 lb

## 2021-07-27 DIAGNOSIS — M19011 Primary osteoarthritis, right shoulder: Secondary | ICD-10-CM

## 2021-07-27 DIAGNOSIS — M19019 Primary osteoarthritis, unspecified shoulder: Secondary | ICD-10-CM

## 2021-07-27 NOTE — Progress Notes (Signed)
Chief Complaint  Patient presents with   Shoulder Pain    Injury in August cranking lawnmower pain comes and goes, getting worse now painful range of motion / history of 3 shoulder surgeries in past 1980s and 1990s    HPI: 65 year old male multiple surgeries right shoulder for instability pulled on a lawnmower cord and felt acute pain in his shoulder in August of this year.  Since that time he had intermittent pain with intermittent intensity and further decrease in range of motion he is here for evaluation and management  Past Medical History:  Diagnosis Date   Arthritis    Back pain    Cataracts, bilateral    immature   GERD (gastroesophageal reflux disease)    using Baking Soda and Water per pt   Headache(784.0)    related to neck issues   History of bronchitis    History of gastric ulcer 20+yrs ago   Hypertension    takes Benazepril some days   Joint pain    Muscle spasm    takes Flexeril daily   Shortness of breath    with exertion   Weakness    and numbness in left fingers   Past Surgical History:  Procedure Laterality Date   ABDOMINAL AORTOGRAM W/LOWER EXTREMITY N/A 10/18/2020   Procedure: ABDOMINAL AORTOGRAM W/LOWER EXTREMITY;  Surgeon: Waynetta Sandy, MD;  Location: [place] [facility];  Service: Cardiovascular;  Laterality: N/A;   ANTERIOR CERVICAL DECOMP/DISCECTOMY FUSION N/A 12/18/2013   Procedure: ANTERIOR CERVICAL DECOMPRESSION/DISCECTOMY FUSION CERVICAL THREE-FOUR, FOUR-FIVE, FIVE-SIX W/ BONEGRAFT;  Surgeon: Hosie Spangle, MD;  Location: [place] [facility];  Service: Neurosurgery;  Laterality: N/A;  ANTERIOR CERVICAL DECOMPRESSION/DISCECTOMY FUSION CERVICAL THREE-FOUR, FOUR-FIVE, FIVE-SIX W/ BONEGRAFT   arm surgery Left    COLONOSCOPY WITH PROPOFOL N/A 11/15/2017   Procedure: COLONOSCOPY WITH PROPOFOL;  Surgeon: Daneil Dolin, MD;  [facility];  Service: Endoscopy;  Laterality: N/A;  12:30pm   ESOPHAGOGASTRODUODENOSCOPY (EGD) WITH PROPOFOL N/A  10/05/2016   Procedure: ESOPHAGOGASTRODUODENOSCOPY (EGD) WITH PROPOFOL;  Surgeon: Daneil Dolin, MD;  [facility];  Service: Endoscopy;  Laterality: N/A;   ESOPHAGOGASTRODUODENOSCOPY (EGD) WITH PROPOFOL N/A 02/15/2017   Procedure: ESOPHAGOGASTRODUODENOSCOPY (EGD) WITH PROPOFOL;  Surgeon: Daneil Dolin, MD;  [facility];  Service: Endoscopy;  Laterality: N/A;  [place] Right 08/18/2016   Procedure: RIGHT INGUINAL HERNORRHAPHY WITH MESH;  Surgeon: Aviva Signs, MD;  Location: [facility];  Service: General;  Laterality: Right;   JOINT REPLACEMENT Right    hip    PERIPHERAL VASCULAR INTERVENTION Right 10/18/2020   Procedure: PERIPHERAL VASCULAR INTERVENTION;  Surgeon: Waynetta Sandy, MD;  Location: [place] [facility];  Service: Cardiovascular;  Laterality: Right;   POLYPECTOMY  11/15/2017   Procedure: POLYPECTOMY;  Surgeon: Daneil Dolin, MD;  [facility];  Service: Endoscopy;;  polyp cs splenic flexure times 2,  cecal polyp   SHOULDER SURGERY Right    x 3      BP (!) 147/76    Pulse 75    Ht 5\' 10"  (1.778 m)    Wt 170 lb (77.1 kg)    BMI 24.39 kg/m    General appearance: Well-developed well-nourished no gross deformities  Cardiovascular normal pulse and perfusion normal color without edema  Neurologically no sensation loss or deficits or pathologic reflexes  Psychological: Awake alert and oriented x3 mood and affect normal  Skin no lacerations or ulcerations no nodularity no palpable  masses, no erythema or nodularity  Musculoskeletal: His right shoulder external rotation is to neutral.  His abduction is 75 degrees.  He has 120 degrees of flexion in the scapular plane with 5 out of 5 muscle strength there  Imaging x-rays 3 views of the right shoulder he has 4 anchors in the glenoid there is a large metal screw/tach in the humerus which may represent a biceps tenodesis.  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

## 2021-09-12 ENCOUNTER — Other Ambulatory Visit: Payer: Self-pay

## 2021-09-12 ENCOUNTER — Ambulatory Visit: Payer: Medicare HMO | Admitting: Physician Assistant

## 2021-09-12 ENCOUNTER — Ambulatory Visit (INDEPENDENT_AMBULATORY_CARE_PROVIDER_SITE_OTHER)
Admission: RE | Admit: 2021-09-12 | Discharge: 2021-09-12 | Disposition: A | Discharge status: Home / Self Care | Payer: Medicare HMO | Source: Ambulatory Visit | Attending: Surgery | Admitting: Surgery

## 2021-09-12 ENCOUNTER — Ambulatory Visit (HOSPITAL_COMMUNITY)
Admission: RE | Admit: 2021-09-12 | Discharge: 2021-09-12 | Disposition: A | Discharge status: Home / Self Care | Payer: Medicare HMO | Source: Ambulatory Visit | Attending: Surgery | Admitting: Surgery

## 2021-09-12 VITALS — BP 94/66 | HR 65 | Temp 97.6°F | Resp 20 | Ht 70.0 in | Wt 168.3 lb

## 2021-09-12 DIAGNOSIS — I739 Peripheral vascular disease, unspecified: Secondary | ICD-10-CM

## 2021-09-12 NOTE — Progress Notes (Signed)
[facility] [place] HISTORY AND PHYSICAL   History of Present Illness:  Patient is a 66 y.o. year old male who presents for evaluation of PAD with symptoms of claudication.  He has a  history of intervention to include  angiogram with right SFA stent placement and balloon angioplasty.   He denies symptoms of claudication, rest pain or non healing wounds currently.  He recently had a right knee injury that is improving and he has rotator cuff tears that he is scheduling to fix in the near future.  He has asked if he can hold his Plavix for shoulder surgery.    He continues to smoke on average 1 cigarette per day.  He declines to quit.  He does stay active and takes daily Plavix and Statin for maximum medical management.          Past Medical History:  Diagnosis Date   Arthritis    Back pain    Cataracts, bilateral    immature   GERD (gastroesophageal reflux disease)    using Baking Soda and Water per pt   Headache(784.0)    related to neck issues   History of bronchitis    History of gastric ulcer 20+yrs ago   Hypertension    takes Benazepril some days   Joint pain    Muscle spasm    takes Flexeril daily   Shortness of breath    with exertion   Weakness    and numbness in left fingers    Past Surgical History:  Procedure Laterality Date   ABDOMINAL AORTOGRAM W/LOWER EXTREMITY N/A 10/18/2020   Procedure: ABDOMINAL AORTOGRAM W/LOWER EXTREMITY;  Surgeon: Waynetta Sandy, MD;  Location: [place] [facility];  Service: Cardiovascular;  Laterality: N/A;   ANTERIOR CERVICAL DECOMP/DISCECTOMY FUSION N/A 12/18/2013   Procedure: ANTERIOR CERVICAL DECOMPRESSION/DISCECTOMY FUSION CERVICAL THREE-FOUR, FOUR-FIVE, FIVE-SIX W/ BONEGRAFT;  Surgeon: Hosie Spangle, MD;  Location: [place] [facility];  Service: Neurosurgery;  Laterality: N/A;  ANTERIOR CERVICAL DECOMPRESSION/DISCECTOMY FUSION CERVICAL THREE-FOUR, FOUR-FIVE, FIVE-SIX W/ BONEGRAFT   arm surgery Left     COLONOSCOPY WITH PROPOFOL N/A 11/15/2017   Procedure: COLONOSCOPY WITH PROPOFOL;  Surgeon: Daneil Dolin, MD;  [facility];  Service: Endoscopy;  Laterality: N/A;  12:30pm   ESOPHAGOGASTRODUODENOSCOPY (EGD) WITH PROPOFOL N/A 10/05/2016   Procedure: ESOPHAGOGASTRODUODENOSCOPY (EGD) WITH PROPOFOL;  Surgeon: Daneil Dolin, MD;  [facility];  Service: Endoscopy;  Laterality: N/A;   ESOPHAGOGASTRODUODENOSCOPY (EGD) WITH PROPOFOL N/A 02/15/2017   Procedure: ESOPHAGOGASTRODUODENOSCOPY (EGD) WITH PROPOFOL;  Surgeon: Daneil Dolin, MD;  [facility];  Service: Endoscopy;  Laterality: N/A;  [place] Right 08/18/2016   Procedure: RIGHT INGUINAL HERNORRHAPHY WITH MESH;  Surgeon: Aviva Signs, MD;  [facility];  Service: General;  Laterality: Right;   JOINT REPLACEMENT Right    hip    PERIPHERAL VASCULAR INTERVENTION Right 10/18/2020   Procedure: PERIPHERAL VASCULAR INTERVENTION;  Surgeon: Waynetta Sandy, MD;  Location: [place] [facility];  Service: Cardiovascular;  Laterality: Right;   POLYPECTOMY  11/15/2017   Procedure: POLYPECTOMY;  Surgeon: Daneil Dolin, MD;  [facility];  Service: Endoscopy;;  polyp cs splenic flexure times 2,  cecal polyp   SHOULDER SURGERY Right    x 3     ROS:   General:  No weight loss, Fever, chills  HEENT: No recent headaches, no nasal bleeding, no visual changes, no sore throat  Neurologic:  No dizziness, blackouts, seizures. No recent symptoms of stroke or mini- stroke. No recent episodes of slurred speech, or temporary blindness.  Cardiac: No recent episodes of chest pain/pressure, no shortness of breath at rest.  No shortness of breath with exertion.  Denies history of atrial fibrillation or irregular heartbeat  Vascular: No history of rest pain in feet.  positive history of claudication.  No history of non-healing ulcer, No history of DVT   Pulmonary: No home oxygen, no productive  cough, no hemoptysis,  No asthma or wheezing  Musculoskeletal:  [ ]  Arthritis, [ ]  Low back pain,  [ x] Joint pain  Hematologic:No history of hypercoagulable state.  No history of easy bleeding.  No history of anemia  Gastrointestinal: No hematochezia or melena,  No gastroesophageal reflux, no trouble swallowing  Urinary: [ ]  chronic Kidney disease, [ ]  on HD - [ ]  MWF or [ ]  TTHS, [ ]  Burning with urination, [ ]  Frequent urination, [ ]  Difficulty urinating;   Skin: No rashes  Psychological: No history of anxiety,  No history of depression  Social History Social History   Tobacco Use   Smoking status: Some Days    Packs/day: 0.10    Years: 47.00    Pack years: 4.70    Types: Cigarettes   Smokeless tobacco: Never  Substance Use Topics   Alcohol use: No   Drug use: Yes    Types: Marijuana    Family History Family History  Problem Relation Age of Onset   Prostate cancer Father    Prostate cancer Brother    Colon cancer Neg Hx     Allergies  Allergies  Allergen Reactions   Nsaids Other (See Comments)    Not to take - "busted intestines open"     Current Outpatient Medications  Medication Sig Dispense Refill   amLODipine (NORVASC) 10 MG tablet Take 10 mg by mouth daily.     atorvastatin (LIPITOR) 20 MG tablet Take 20 mg by mouth daily.     clopidogrel (PLAVIX) 75 MG tablet Take 1 tablet (75 mg total) by mouth daily. 30 tablet 11   losartan-hydrochlorothiazide (HYZAAR) 100-25 MG tablet Take 1 tablet by mouth daily.     pantoprazole (PROTONIX) 40 MG tablet Take 1 tablet (40 mg total) by mouth daily. (Patient taking differently: Take 80 mg by mouth daily.) 90 tablet 3   No current facility-administered medications for this visit.    Physical Examination  Vitals:   09/12/21 1109  BP: 94/66  Pulse: 65  Resp: 20  Temp: 97.6 F (36.4 C)  TempSrc: Temporal  SpO2: (!) 65%  Weight: 168 lb 4.8 oz (76.3 kg)  Height: 5\' 10"  (1.778 m)    Body mass index is 24.15  kg/m.  General:  Alert and oriented, no acute distress HEENT: Normal Neck: No bruit or JVD Pulmonary: Clear to auscultation bilaterally Cardiac: Regular Rate and Rhythm without murmur Abdomen: Soft, non-tender, non-distended, no mass, no scars Skin: No rash Extremity Pulses:  2+ radial, brachial, femoral, right dorsalis pedis palpable, left non palpable Musculoskeletal: No deformity or edema, tenderness to right knee s/p over extension injury Neurologic: Upper and lower extremity motor grossly intact.    DATA:      ABI Findings:  +---------+------------------+-----+--------+--------+   Right     Rt Pressure (mmHg) Index Waveform Comment    +---------+------------------+-----+--------+--------+   Brachial  116                                          +---------+------------------+-----+--------+--------+  PTA       108                0.93  biphasic            +---------+------------------+-----+--------+--------+   DP        102                0.88  biphasic            +---------+------------------+-----+--------+--------+   Great Toe                          Absent              +---------+------------------+-----+--------+--------+   +---------+------------------+-----+----------+-------+   Left      Lt Pressure (mmHg) Index Waveform   Comment   +---------+------------------+-----+----------+-------+   Brachial  96                                            +---------+------------------+-----+----------+-------+   PTA       97                 0.84  monophasic           +---------+------------------+-----+----------+-------+   DP        100                0.86  biphasic             +---------+------------------+-----+----------+-------+   Great Toe                          Absent               +---------+------------------+-----+----------+-------+   +-------+-----------+-----------+------------+------------+   ABI/TBI Today's ABI Today's TBI Previous ABI Previous TBI    +-------+-----------+-----------+------------+------------+   Right   0.93        absent      0.93         absent         +-------+-----------+-----------+------------+------------+   Left    0.86        absent      0.99         absent         +-------+-----------+-----------+------------+------------+       Right ABIs appear essentially unchanged. Left ABIs appear decreased  compared to prior study on 12/06/2020.     Summary:  Right: Resting right ankle-brachial index indicates mild right lower  extremity arterial disease.   Left: Resting left ankle-brachial index indicates mild left lower  extremity arterial disease.      Right Stent(s):  +---------------+--------+--------------+--------+--------+   SFA             PSV cm/s Stenosis       Waveform Comments   +---------------+--------+--------------+--------+--------+   Prox to Stent   93                      biphasic            +---------------+--------+--------------+--------+--------+   Proximal Stent  122                     biphasic            +---------------+--------+--------------+--------+--------+   Mid Stent  54                      biphasic            +---------------+--------+--------------+--------+--------+   Distal Stent    47                      biphasic            +---------------+--------+--------------+--------+--------+   Distal to Stent 111      1-49% stenosis biphasic            +---------------+--------+--------------+--------+--------+   Summary:  Right: Widely patent superficial femoral artery stent with mildly elevated  velocities in the immediate outflow artery (1-49%).   ASSESSMENT/PLAN:  PAD with history of symptoms of claudication.  He has a  history of intervention to include right LE angiogram with right SFA stent placement and balloon angioplasty CFA.  Angiogram findings after angiogram no residual stenosis and runoff via all 3 vessels.  Left lower extremity has no flow-limiting  stenosis on 10/18/20.  ABI's demonstrate biphasic wave forms with Index of 88%on the right and mixed biphasic/monophasic wave forms and index of 86% on the left.  B LE without toe pressures.  On exam he has a palpable right DP pulse.   He is active and denies claudications B LE, rest pain or non healing wounds.  He is scheduled to have should surgery soon.  We give permission to hold Plavix for surgery and please re start it post op.  He will f/u with [facility] in 1 year for repeat surveillance studies.    If he develops symptoms of ischemia he will call our office.       Roxy Horseman PA-C [facility] [place] Office: [phone]  MD in clinic [place]

## 2021-09-14 ENCOUNTER — Telehealth: Payer: Self-pay | Admitting: Orthopedic Surgery

## 2021-09-14 NOTE — Telephone Encounter (Signed)
Patient relays that he has seen his vascular vein specialist and that notes should be in his chart. States he would like to have the surgery as soon as possible. His next scheduled appointment is Wednesday, 10/26/21, with Dr Aline Brochure, which is noted that he is to be seen when both Dr Aline Brochure and Dr Amedeo Kinsman are in clinic. If any other advice, please let patient know.

## 2021-09-15 NOTE — Telephone Encounter (Signed)
Patient is asking if we can see him sooner than 10/26/21 appointment (on a Wednesday that both Dr Aline Brochure and Dr Amedeo Kinsman can see him)

## 2021-09-19 NOTE — Telephone Encounter (Signed)
Spoke with patient; scheduled accordingly.

## 2021-10-05 ENCOUNTER — Ambulatory Visit: Payer: Medicare HMO | Admitting: Orthopedic Surgery

## 2021-10-05 ENCOUNTER — Ambulatory Visit (INDEPENDENT_AMBULATORY_CARE_PROVIDER_SITE_OTHER): Payer: Medicare HMO | Admitting: Orthopedic Surgery

## 2021-10-05 ENCOUNTER — Other Ambulatory Visit: Payer: Self-pay

## 2021-10-05 ENCOUNTER — Encounter: Payer: Self-pay | Admitting: Orthopedic Surgery

## 2021-10-05 VITALS — BP 91/55 | HR 64 | Ht 70.0 in | Wt 165.0 lb

## 2021-10-05 DIAGNOSIS — M19019 Primary osteoarthritis, unspecified shoulder: Secondary | ICD-10-CM

## 2021-10-05 DIAGNOSIS — M19011 Primary osteoarthritis, right shoulder: Secondary | ICD-10-CM | POA: Diagnosis not present

## 2021-10-05 NOTE — Progress Notes (Signed)
Dr. Rosey Bath and I have seen Stephen Sullivan and discussed his situation.  Dr. Rosey Bath will take over care with me as assistant as he deems necessary ? ? ?

## 2021-10-06 ENCOUNTER — Encounter: Payer: Self-pay | Admitting: Orthopedic Surgery

## 2021-10-06 NOTE — Progress Notes (Addendum)
New Patient Visit  Assessment: Stephen Sullivan is a 66 y.o. male with the following: 1. Glenohumeral arthritis, right  Plan: Mr. Cepin has severe right glenohumeral arthritis with a remote history of multiple procedures for right shoulder instability.  His pain has been severe for the past 6 months.  He has limited range of motion and function.  Given the severity of his arthritis, as well as the deformity, the most reliable option surgically is to replace the shoulder.  This was discussed with the patient today.  Given his previous history of surgery, there are several complicating factors.  We will have to remove the hardware at the time of surgery and he is unsure what was done during previous surgery.  There is also possible risk for infection, although, I do not appreciate any redness, drainage or other potential signs of an infection at this time.    Due to his previous history of surgeries, as well as his current smoking, he will be at increased risk for surgical wound issues, as well as infection following the procedure.  This was discussed in great detail, and all questions were answered.  He states he already has surgical clearance and will quit smoking.  The next step will be to obtain a CT scan of his right shoulder.  We will need to review those results, and finalize a plan.  He is very interested in surgery, and is hopeful to get this done as soon as possible.   Follow-up: Return for After CT Scan.  Subjective:  Chief Complaint  Patient presents with   Shoulder Pain    Right shoulder pain, surgery consult per Dr Romeo Apple.      History of Present Illness: Stephen Sullivan is a 66 y.o. male who presents for evaluation of right shoulder pain.  He has previously been followed by my partner, Dr. Romeo Apple.  He reports he has had severe pain in the right shoulder for the past 6 months.  At that time, he was trying to start a lawnmower, when he noticed severe pain.  He does have a  history of multiple procedures on his right shoulder, performed approximately 30 years ago for right shoulder instability.  He states he has had at least 3 surgeries.  He was doing okay until recently.  He has limited motion.  Medications are not improving his symptoms.   Review of Systems: No fevers or chills No numbness or tingling No chest pain No shortness of breath No bowel or bladder dysfunction No GI distress No headaches   Medical History:  Past Medical History:  Diagnosis Date   Arthritis    Back pain    Cataracts, bilateral    immature   GERD (gastroesophageal reflux disease)    using Baking Soda and Water per pt   Headache(784.0)    related to neck issues   History of bronchitis    History of gastric ulcer 20+yrs ago   Hypertension    takes Benazepril some days   Joint pain    Muscle spasm    takes Flexeril daily   Shortness of breath    with exertion   Weakness    and numbness in left fingers    Past Surgical History:  Procedure Laterality Date   ABDOMINAL AORTOGRAM W/LOWER EXTREMITY N/A 10/18/2020   Procedure: ABDOMINAL AORTOGRAM W/LOWER EXTREMITY;  Surgeon: Maeola Harman, MD;  Location: [facility] INVASIVE CV LAB;  Service: Cardiovascular;  Laterality: N/A;   ANTERIOR CERVICAL DECOMP/DISCECTOMY FUSION N/A  12/18/2013   Procedure: ANTERIOR CERVICAL DECOMPRESSION/DISCECTOMY FUSION CERVICAL THREE-FOUR, FOUR-FIVE, FIVE-SIX W/ BONEGRAFT;  Surgeon: Hewitt Shorts, MD;  Location: [facility];  Service: Neurosurgery;  Laterality: N/A;  ANTERIOR CERVICAL DECOMPRESSION/DISCECTOMY FUSION CERVICAL THREE-FOUR, FOUR-FIVE, FIVE-SIX W/ BONEGRAFT   arm surgery Left    COLONOSCOPY WITH PROPOFOL N/A 11/15/2017   Procedure: COLONOSCOPY WITH PROPOFOL;  Surgeon: Corbin Ade, MD;  Location: [facility];  Service: Endoscopy;  Laterality: N/A;  12:30pm   ESOPHAGOGASTRODUODENOSCOPY (EGD) WITH PROPOFOL N/A 10/05/2016   Procedure: ESOPHAGOGASTRODUODENOSCOPY (EGD) WITH  PROPOFOL;  Surgeon: Corbin Ade, MD;  Location: [facility];  Service: Endoscopy;  Laterality: N/A;   ESOPHAGOGASTRODUODENOSCOPY (EGD) WITH PROPOFOL N/A 02/15/2017   Procedure: ESOPHAGOGASTRODUODENOSCOPY (EGD) WITH PROPOFOL;  Surgeon: Corbin Ade, MD;  Location: [facility];  Service: Endoscopy;  Laterality: N/A;  1115   INGUINAL HERNIA REPAIR Right 08/18/2016   Procedure: RIGHT INGUINAL HERNORRHAPHY WITH MESH;  Surgeon: Franky Macho, MD;  Location: [facility];  Service: General;  Laterality: Right;   JOINT REPLACEMENT Right    hip    PERIPHERAL VASCULAR INTERVENTION Right 10/18/2020   Procedure: PERIPHERAL VASCULAR INTERVENTION;  Surgeon: Maeola Harman, MD;  Location: [facility] INVASIVE CV LAB;  Service: Cardiovascular;  Laterality: Right;   POLYPECTOMY  11/15/2017   Procedure: POLYPECTOMY;  Surgeon: Corbin Ade, MD;  Location: [facility];  Service: Endoscopy;;  polyp cs splenic flexure times 2,  cecal polyp   SHOULDER SURGERY Right    x 3     Family History  Problem Relation Age of Onset   Prostate cancer Father    Prostate cancer Brother    Colon cancer Neg Hx    Social History   Tobacco Use   Smoking status: Some Days    Packs/day: 0.10    Years: 47.00    Pack years: 4.70    Types: Cigarettes   Smokeless tobacco: Never  Substance Use Topics   Alcohol use: No   Drug use: Yes    Types: Marijuana    Allergies  Allergen Reactions   Ibuprofen Other (See Comments)    Causes bleeding   Nsaids Other (See Comments)    Not to take - "busted intestines open"    No outpatient medications have been marked as taking for the 10/05/21 encounter (Office Visit) with Oliver Barre, MD.    Objective: There were no vitals taken for this visit.  Physical Exam:  General: Alert and oriented. and No acute distress. Gait: Normal gait.  Evaluation of the right shoulder demonstrates multiple anterior based surgical incisions, which are well-healed.  No surrounding  erythema or redness.  No tenderness to palpation in line with the incision.  He has diffuse tenderness to palpation over the anterior shoulder.  He also has some tenderness to palpation over the posterior aspect of the shoulder, around the scapula.  Active forward flexion to 110 degrees.  Passively, he can abduct to 90 degrees.  His shoulder is essentially locked in a neutral position.  0 degrees of external rotation.  Fingers are warm and well-perfused.  2+ radial pulse.  IMAGING: I personally reviewed images previously obtained in clinic  Severe right glenohumeral arthritis, with flattening of the humeral head.  Multiple anchors are seen in the anterior glenoid.  There is a screw present in the proximal humerus.   New Medications:  No orders of the defined types were placed in this encounter.     Oliver Barre, MD  10/06/2021 9:24 AM

## 2021-10-08 ENCOUNTER — Other Ambulatory Visit: Payer: Self-pay | Admitting: Vascular Surgery

## 2021-10-26 ENCOUNTER — Ambulatory Visit (HOSPITAL_COMMUNITY)
Admission: RE | Admit: 2021-10-26 | Discharge: 2021-10-26 | Disposition: A | Discharge status: Home / Self Care | Payer: Medicare HMO | Source: Ambulatory Visit | Attending: Orthopedic Surgery | Admitting: Orthopedic Surgery

## 2021-10-26 ENCOUNTER — Other Ambulatory Visit: Payer: Self-pay

## 2021-10-26 ENCOUNTER — Ambulatory Visit: Payer: Medicare HMO | Admitting: Orthopedic Surgery

## 2021-10-26 DIAGNOSIS — M19011 Primary osteoarthritis, right shoulder: Secondary | ICD-10-CM | POA: Insufficient documentation

## 2021-11-03 ENCOUNTER — Encounter: Payer: Self-pay | Admitting: Orthopedic Surgery

## 2021-11-03 ENCOUNTER — Ambulatory Visit: Payer: Medicare HMO | Admitting: Orthopedic Surgery

## 2021-11-03 VITALS — Ht 70.0 in | Wt 165.0 lb

## 2021-11-03 DIAGNOSIS — Z01818 Encounter for other preprocedural examination: Secondary | ICD-10-CM

## 2021-11-03 DIAGNOSIS — M19011 Primary osteoarthritis, right shoulder: Secondary | ICD-10-CM

## 2021-11-03 NOTE — Progress Notes (Signed)
FOLLOW UP  ? ?Encounter Diagnoses  ?Name Primary?  ? Pre-op evaluation   ? Glenohumeral arthritis, right Yes  ? ? ? ?Chief Complaint  ?Patient presents with  ? Shoulder Pain  ?  Rt shoulder pain CT results  ? ? ? ?Stephen Sullivan came in today for results related to his CT scan right shoulder in preparation for shoulder surgery ? ?Dr. Rosey Bath has reviewed the CT I have seen it as well he has glenohumeral arthritis ? ?Dr. Rosey Bath is planning a reverse shoulder arthroplasty I showed the patient the difference between the 2 I told him that he cannot get full range of motion after the surgery as he is already very stiff ? ? ?I independently  Read the CT scan as follows ?The CT scan shows severe arthritis of the shoulder with multiple osteophytes there is also old hardware in the humerus the glenohumeral joint shows severe narrowing the glenoid does not appear to have posterior erosion ? ?The patient is sent for CBC with differential sed rate C-reactive protein basic metabolic panel ? ?Dr. Rosey Bath will call the patient tomorrow ?

## 2021-11-05 LAB — CBC WITH DIFFERENTIAL/PLATELET
Absolute Monocytes: 668 cells/uL (ref 200–950)
Basophils Absolute: 18 cells/uL (ref 0–200)
Basophils Relative: 0.2 %
Eosinophils Absolute: 62 cells/uL (ref 15–500)
Eosinophils Relative: 0.7 %
HCT: 46.2 % (ref 38.5–50.0)
Hemoglobin: 15.5 g/dL (ref 13.2–17.1)
Lymphs Abs: 2136 cells/uL (ref 850–3900)
MCH: 30.7 pg (ref 27.0–33.0)
MCHC: 33.5 g/dL (ref 32.0–36.0)
MCV: 91.5 fL (ref 80.0–100.0)
MPV: 10.5 fL (ref 7.5–12.5)
Monocytes Relative: 7.5 %
Neutro Abs: 6016 cells/uL (ref 1500–7800)
Neutrophils Relative %: 67.6 %
Platelets: 222 10*3/uL (ref 140–400)
RBC: 5.05 10*6/uL (ref 4.20–5.80)
RDW: 13.6 % (ref 11.0–15.0)
Total Lymphocyte: 24 %
WBC: 8.9 10*3/uL (ref 3.8–10.8)

## 2021-11-05 LAB — BASIC METABOLIC PANEL
BUN: 10 mg/dL (ref 7–25)
CO2: 26 mmol/L (ref 20–32)
Calcium: 9.8 mg/dL (ref 8.6–10.3)
Chloride: 102 mmol/L (ref 98–110)
Creat: 1.2 mg/dL (ref 0.70–1.35)
Glucose, Bld: 92 mg/dL (ref 65–139)
Potassium: 3.8 mmol/L (ref 3.5–5.3)
Sodium: 138 mmol/L (ref 135–146)

## 2021-11-05 LAB — SEDIMENTATION RATE: Sed Rate: 6 mm/h (ref 0–20)

## 2021-11-07 ENCOUNTER — Telehealth: Payer: Self-pay | Admitting: Orthopedic Surgery

## 2021-11-07 NOTE — Telephone Encounter (Signed)
Patient called relaying he may have missed the Dr's call . Last visit note indicates: "Return for dr Amedeo Kinsman will call the patient " - patient's phone # is [phone] ?

## 2021-11-08 NOTE — Telephone Encounter (Signed)
noted 

## 2021-11-09 ENCOUNTER — Encounter: Payer: Self-pay | Admitting: Orthopedic Surgery

## 2021-11-09 ENCOUNTER — Ambulatory Visit (INDEPENDENT_AMBULATORY_CARE_PROVIDER_SITE_OTHER): Payer: Medicare HMO | Admitting: Orthopedic Surgery

## 2021-11-09 DIAGNOSIS — M19011 Primary osteoarthritis, right shoulder: Secondary | ICD-10-CM | POA: Diagnosis not present

## 2021-11-09 NOTE — Progress Notes (Signed)
[facility] Return - Virtual Telephone Visit ?  ?**Visit was conducted via telephone at the patient's request.  No vital signs or physical exam were completed.  All previous medical records were readily available during my conversation with the patient.  In total, I was on the phone with the patient for 15 minutes** ?  ?Location: ?Patient: Home ?Provider: Clinic ? ?[id number] - 11-20 minutes  ? ?Assessment: ?NORMON PETTIJOHN is a 66 y.o. male with the following: ?Right shoulder glenohumeral arthritis; in setting of prior surgeries ? ? ?Plan: ?I had an extensive discussion with Mr. Gacek today via telephone in regards to his right shoulder.  He continues to have pain, and severely limited mobility.  His labs are normal, with very little concern for a smoldering infection.  We have obtained a CT scan which demonstrates severe arthritis, without severe deformity.  We discussed the presence of existing hardware, and the challenges of that this can create.  He is aware. ? ?Risks and benefits of the surgery, including, but not limited to infection, bleeding, persistent pain, need for further surgery, fracture, dislocation, instability, non-union, malunion and more severe complications associated with anesthesia were discussed with the patient.  The patient has elected to proceed. ? ?The patient is at an increased risk for fracture, infection, bleeding, instability due to multiple prior surgeries on the right shoulder. ? ?He would like to proceed with surgery on December 01, 2021.  I will ask Dr. Aline Brochure to assist with surgery.  All questions were answered, and he is amenable this plan.  We will confirm medical clearance prior to surgery.  In addition, he is to stop Plavix 5 days prior to surgery.  I will contact him in the week before surgery, to serve as a reminder. ? ?Follow-up: ?Return for After surgery. ? ? ?Subjective: ? ?Chief Complaint  ?Patient presents with  ? right shoulder pain  ?  Discuss plan for OR    ? ? ?History of Present Illness: ?I connected with  Camelia Eng on 11/09/21 via telephone and verified that I am speaking with the correct person using two identifiers. ?  ?I discussed the limitations of evaluation and management by telemedicine. The patient expressed understanding and agreed to proceed. ? ? ?BRENNAN LITZINGER is a 66 y.o. male who presents with right shoulder pain.  He has a history of multiple procedures on his right shoulder.  He has gone on to develop severe right glenohumeral arthritis.  He continues to have pain.  He has limited motion.  The pain is constant.  Small movements create sharp pains.  He has obtained a CT scan, and completed basic labs.  He is ready to proceed with surgery.  He would like to get this done as quickly as possible. ? ?Review of Systems: ?No fevers or chills ?No numbness or tingling ?No chest pain ?No shortness of breath ?No bowel or bladder dysfunction ?No GI distress ?No headaches ? ? ?Objective: ?No vital signs ? ?Physical Exam: ? ?Telephone consult - No exam  ? ?IMAGING: ?I personally ordered and reviewed the following images: ? ?No new imaging today ? ?Mordecai Rasmussen, MD ?11/09/2021 ?10:45 PM ? ? ? ?

## 2021-11-14 ENCOUNTER — Encounter (HOSPITAL_COMMUNITY): Payer: Self-pay | Admitting: Anesthesiology

## 2021-11-21 ENCOUNTER — Telehealth: Payer: Self-pay | Admitting: Radiology

## 2021-11-21 NOTE — Telephone Encounter (Signed)
Patient called, wanted to clarify when he was to d/c the plavix. He mentioned you had said 5 days prior which he says is this Sunday.  Please call him to advise on when to d/c plavix.  Thanks. ?

## 2021-11-22 ENCOUNTER — Other Ambulatory Visit: Payer: Self-pay

## 2021-11-22 DIAGNOSIS — M19011 Primary osteoarthritis, right shoulder: Secondary | ICD-10-CM

## 2021-11-24 ENCOUNTER — Telehealth: Payer: Self-pay

## 2021-11-24 NOTE — Telephone Encounter (Signed)
Called pt and let him know when to stop Plavix, pt verbalized understanding and states he's righting it on his calendar. No more questions or concerns at this time.  ?

## 2021-11-24 NOTE — Telephone Encounter (Signed)
[employer] left message stating that they need information fax to them for review ?The number left was [phone] ?

## 2021-11-25 NOTE — Telephone Encounter (Signed)
Information faxed to # listed. ?

## 2021-11-28 ENCOUNTER — Telehealth: Payer: Self-pay

## 2021-11-28 DIAGNOSIS — M19011 Primary osteoarthritis, right shoulder: Secondary | ICD-10-CM

## 2021-11-28 NOTE — Patient Instructions (Addendum)
? Your procedure is scheduled on: 6:00 ? Report to [facility] Entr[facility] at     6:00 AM. ? Call this number if you have problems the morning of surgery: [phone] ? ? Remember: ? ? Do not Eat after midnight, You may have Clear liquids until 3:30 am ? ?Drink 1 bottle clear carbohydrate  Ensure at 3:30 am ? ?      No Smoking the morning of surgery ? : ? Take these medicines the morning of surgery with A SIP OF WATER: Amlodipine and Pantoprazole ? ? Do not wear jewelry, make-up or nail polish. ? Do not wear lotions, powders, or perfumes. You may wear deodorant. ? Do not shave 48 hours prior to surgery. Men may shave face and neck. ? Do not bring valuables to the hospital. ? Contacts, dentures or bridgework may not be worn into surgery. ? Leave suitcase in the car. After surgery it may be brought to your room. ? For patients admitted to the hospital, checkout time is 11:00 AM the day of discharge. ? ? Patients discharged the day of surgery will not be allowed to drive home. ?  ? Special Instructions: Shower using CHG night before surgery and shower the day of surgery use CHG.  Use special wash - you have one bottle of CHG for all showers.  You should use approximately 1/2 of the bottle for each shower.  ?How to Use Chlorhexidine for Bathing ?Chlorhexidine gluconate (CHG) is a germ-killing (antiseptic) solution that is used to clean the skin. It can get rid of the bacteria that normally live on the skin and can keep them away for about 24 hours. To clean your skin with CHG, you may be given: ?A CHG solution to use in the shower or as part of a sponge bath. ?A prepackaged cloth that contains CHG. ?Cleaning your skin with CHG may help lower the risk for infection: ?While you are staying in the intensive care unit of the hospital. ?If you have a vascular access, such as a central line, to provide short-term or long-term access to your veins. ?If you have a catheter to drain urine from your bladder. ?If you are on a  ventilator. A ventilator is a machine that helps you breathe by moving air in and out of your lungs. ?After surgery. ?What are the risks? ?Risks of using CHG include: ?A skin reaction. ?Hearing loss, if CHG gets in your ears and you have a perforated eardrum. ?Eye injury, if CHG gets in your eyes and is not rinsed out. ?The CHG product catching fire. ?Make sure that you avoid smoking and flames after applying CHG to your skin. ?Do not use CHG: ?If you have a chlorhexidine allergy or have previously reacted to chlorhexidine. ?On babies younger than 73 months of age. ?How to use CHG solution ?Use CHG only as told by your health care provider, and follow the instructions on the label. ?Use the full amount of CHG as directed. Usually, this is one bottle. ?During a shower ?Follow these steps when using CHG solution during a shower (unless your health care provider gives you different instructions): ?Start the shower. ?Use your normal soap and shampoo to wash your face and hair. ?Turn off the shower or move out of the shower stream. ?Pour the CHG onto a clean washcloth. Do not use any type of brush or rough-edged sponge. ?Starting at your neck, lather your body down to your toes. Make sure you follow these instructions: ?If you will  be having surgery, pay special attention to the part of your body where you will be having surgery. Scrub this area for at least 1 minute. ?Do not use CHG on your head or face. If the solution gets into your ears or eyes, rinse them well with water. ?Avoid your genital area. ?Avoid any areas of skin that have broken skin, cuts, or scrapes. ?Scrub your back and under your arms. Make sure to wash skin folds. ?Let the lather sit on your skin for 1-2 minutes or as long as told by your health care provider. ?Thoroughly rinse your entire body in the shower. Make sure that all body creases and crevices are rinsed well. ?Dry off with a clean towel. Do not put any substances on your body afterward--such  as powder, lotion, or perfume--unless you are told to do so by your health care provider. Only use lotions that are recommended by the manufacturer. ?Put on clean clothes or pajamas. ?If it is the night before your surgery, sleep in clean sheets. ? ?During a sponge bath ?Follow these steps when using CHG solution during a sponge bath (unless your health care provider gives you different instructions): ?Use your normal soap and shampoo to wash your face and hair. ?Pour the CHG onto a clean washcloth. ?Starting at your neck, lather your body down to your toes. Make sure you follow these instructions: ?If you will be having surgery, pay special attention to the part of your body where you will be having surgery. Scrub this area for at least 1 minute. ?Do not use CHG on your head or face. If the solution gets into your ears or eyes, rinse them well with water. ?Avoid your genital area. ?Avoid any areas of skin that have broken skin, cuts, or scrapes. ?Scrub your back and under your arms. Make sure to wash skin folds. ?Let the lather sit on your skin for 1-2 minutes or as long as told by your health care provider. ?Using a different clean, wet washcloth, thoroughly rinse your entire body. Make sure that all body creases and crevices are rinsed well. ?Dry off with a clean towel. Do not put any substances on your body afterward--such as powder, lotion, or perfume--unless you are told to do so by your health care provider. Only use lotions that are recommended by the manufacturer. ?Put on clean clothes or pajamas. ?If it is the night before your surgery, sleep in clean sheets. ?How to use CHG prepackaged cloths ?Only use CHG cloths as told by your health care provider, and follow the instructions on the label. ?Use the CHG cloth on clean, dry skin. ?Do not use the CHG cloth on your head or face unless your health care provider tells you to. ?When washing with the CHG cloth: ?Avoid your genital area. ?Avoid any areas of skin  that have broken skin, cuts, or scrapes. ?Before surgery ?Follow these steps when using a CHG cloth to clean before surgery (unless your health care provider gives you different instructions): ?Using the CHG cloth, vigorously scrub the part of your body where you will be having surgery. Scrub using a back-and-forth motion for 3 minutes. The area on your body should be completely wet with CHG when you are done scrubbing. ?Do not rinse. Discard the cloth and let the area air-dry. Do not put any substances on the area afterward, such as powder, lotion, or perfume. ?Put on clean clothes or pajamas. ?If it is the night before your surgery, sleep in clean sheets. ? ?  For general bathing ?Follow these steps when using CHG cloths for general bathing (unless your health care provider gives you different instructions). ?Use a separate CHG cloth for each area of your body. Make sure you wash between any folds of skin and between your fingers and toes. Wash your body in the following order, switching to a new cloth after each step: ?The front of your neck, shoulders, and chest. ?Both of your arms, under your arms, and your hands. ?Your stomach and groin area, avoiding the genitals. ?Your right leg and foot. ?Your left leg and foot. ?The back of your neck, your back, and your buttocks. ?Do not rinse. Discard the cloth and let the area air-dry. Do not put any substances on your body afterward--such as powder, lotion, or perfume--unless you are told to do so by your health care provider. Only use lotions that are recommended by the manufacturer. ?Put on clean clothes or pajamas. ?Contact a health care provider if: ?Your skin gets irritated after scrubbing. ?You have questions about using your solution or cloth. ?You swallow any chlorhexidine. Call your local poison control center (1-838-337-1332 in the [place] ?Get help right away if: ?Your eyes itch badly, or they become very red or swollen. ?Your skin itches badly and is red or  swollen. ?Your hearing changes. ?You have trouble seeing. ?You have swelling or tingling in your mouth or throat. ?You have trouble breathing. ?These symptoms may represent a serious problem that is an

## 2021-11-28 NOTE — Telephone Encounter (Signed)
Message left on voicemail stating that they need more information. ?Asking about codes [id number], [id number]. ?Stated that a P2P could be scheduled by Wednesday 4/26 and this is the number that was left  [phone] ?

## 2021-11-29 ENCOUNTER — Other Ambulatory Visit (HOSPITAL_COMMUNITY): Payer: Medicare HMO | Attending: Orthopedic Surgery

## 2021-11-29 ENCOUNTER — Encounter (HOSPITAL_COMMUNITY)
Admission: RE | Admit: 2021-11-29 | Discharge: 2021-11-29 | Disposition: A | Discharge status: Home / Self Care | Payer: Medicare HMO | Source: Ambulatory Visit | Attending: Orthopedic Surgery | Admitting: Orthopedic Surgery

## 2021-11-29 VITALS — BP 109/76 | HR 56 | Temp 97.7°F | Resp 18 | Ht 70.0 in | Wt 170.0 lb

## 2021-11-29 DIAGNOSIS — Z01810 Encounter for preprocedural cardiovascular examination: Secondary | ICD-10-CM | POA: Insufficient documentation

## 2021-11-29 DIAGNOSIS — Z01818 Encounter for other preprocedural examination: Secondary | ICD-10-CM

## 2021-11-29 LAB — TYPE AND SCREEN
ABO/RH(D): O POS
Antibody Screen: NEGATIVE

## 2021-11-29 NOTE — Telephone Encounter (Signed)
P2P done and case was denied (Pt needs 6 weeks of OT, or evaluation stating pt can not complete therapy) , attempted to call [facility] to get patient in to an appointment, still awaiting return call. Called pt and let him know surgery was denied and the reasoning. Pt stated he's been having this problem for a while and that he's upset because he can't do anything with the arm. Asked if he should start back taking his blood thinner at this time. Wasn't sure what to advise so I let the patient know that I will discuss with Dr. Amedeo Kinsman and call him in the morning. Pt verbalized understanding.  ?

## 2021-11-30 NOTE — Telephone Encounter (Signed)
Advised pt to take his Plavix, and that we will update him on information as it comes to [place].  ?

## 2021-12-01 ENCOUNTER — Ambulatory Visit (HOSPITAL_COMMUNITY): Admission: RE | Admit: 2021-12-01 | Payer: Medicare HMO | Source: Ambulatory Visit | Admitting: Orthopedic Surgery

## 2021-12-01 ENCOUNTER — Encounter (HOSPITAL_COMMUNITY): Admission: RE | Payer: Self-pay | Source: Ambulatory Visit

## 2021-12-01 SURGERY — ARTHROPLASTY, SHOULDER, TOTAL, REVERSE
Anesthesia: Choice | Site: Shoulder | Laterality: Right

## 2021-12-02 ENCOUNTER — Ambulatory Visit (HOSPITAL_COMMUNITY): Payer: Medicare HMO | Attending: Orthopedic Surgery | Admitting: Occupational Therapy

## 2021-12-02 ENCOUNTER — Encounter (HOSPITAL_COMMUNITY): Payer: Self-pay | Admitting: Occupational Therapy

## 2021-12-02 DIAGNOSIS — M25611 Stiffness of right shoulder, not elsewhere classified: Secondary | ICD-10-CM | POA: Insufficient documentation

## 2021-12-02 DIAGNOSIS — M25511 Pain in right shoulder: Secondary | ICD-10-CM | POA: Insufficient documentation

## 2021-12-02 DIAGNOSIS — R29898 Other symptoms and signs involving the musculoskeletal system: Secondary | ICD-10-CM | POA: Insufficient documentation

## 2021-12-02 NOTE — Therapy (Addendum)
OUTPATIENT OCCUPATIONAL THERAPY ORTHO EVALUATION  Patient Name: Stephen Sullivan MRN: [id number] DOB:1956/06/19, 66 y.o., male Today's Date: 12/02/2021  PCP: Sharlyne Cai, NP REFERRING PROVIDER: Dr. Larena Glassman    OCCUPATIONAL THERAPY DISCHARGE SUMMARY  Visits from Start of Care: 1  Current functional level related to goals / functional outcomes: Pt did not return after initial evaluation, has now had hardware removal and TSA. Has new referral to return to therapy.    Remaining deficits: unknown   Education / Equipment: HEP at evaluation   Patient agrees to discharge. Patient goals were not met. Patient is being discharged due to not returning since the last visit..      OT End of Session - 12/02/21 1340     Visit Number 1    Number of Visits 2    Date for OT Re-Evaluation 01/01/22    Authorization Type [employer]    Progress Note Due on Visit 10    OT Start Time 1302    OT Stop Time 1329    OT Time Calculation (min) 27 min    Activity Tolerance Patient tolerated treatment well    Behavior During Therapy WFL for tasks assessed/performed             Past Medical History:  Diagnosis Date   Arthritis    Back pain    Cataracts, bilateral    immature   GERD (gastroesophageal reflux disease)    using Baking Soda and Water per pt   Headache(784.0)    related to neck issues   History of bronchitis    History of gastric ulcer 20+yrs ago   Hypertension    takes Benazepril some days   Joint pain    Muscle spasm    takes Flexeril daily   Shortness of breath    with exertion   Weakness    and numbness in left fingers   Past Surgical History:  Procedure Laterality Date   ABDOMINAL AORTOGRAM W/LOWER EXTREMITY N/A 10/18/2020   Procedure: ABDOMINAL AORTOGRAM W/LOWER EXTREMITY;  Surgeon: Waynetta Sandy, MD;  Location: [facility];  Service: Cardiovascular;  Laterality: N/A;   ANTERIOR CERVICAL DECOMP/DISCECTOMY FUSION N/A 12/18/2013    Procedure: ANTERIOR CERVICAL DECOMPRESSION/DISCECTOMY FUSION CERVICAL THREE-FOUR, FOUR-FIVE, FIVE-SIX W/ BONEGRAFT;  Surgeon: Hosie Spangle, MD;  Location: [facility];  Service: Neurosurgery;  Laterality: N/A;  ANTERIOR CERVICAL DECOMPRESSION/DISCECTOMY FUSION CERVICAL THREE-FOUR, FOUR-FIVE, FIVE-SIX W/ BONEGRAFT   arm surgery Left    COLONOSCOPY WITH PROPOFOL N/A 11/15/2017   Procedure: COLONOSCOPY WITH PROPOFOL;  Surgeon: Daneil Dolin, MD;  Location: [facility];  Service: Endoscopy;  Laterality: N/A;  12:30pm   ESOPHAGOGASTRODUODENOSCOPY (EGD) WITH PROPOFOL N/A 10/05/2016   Procedure: ESOPHAGOGASTRODUODENOSCOPY (EGD) WITH PROPOFOL;  Surgeon: Daneil Dolin, MD;  Location: [facility];  Service: Endoscopy;  Laterality: N/A;   ESOPHAGOGASTRODUODENOSCOPY (EGD) WITH PROPOFOL N/A 02/15/2017   Procedure: ESOPHAGOGASTRODUODENOSCOPY (EGD) WITH PROPOFOL;  Surgeon: Daneil Dolin, MD;  Location: [facility];  Service: Endoscopy;  Laterality: N/A;  Ball Ground Right 08/18/2016   Procedure: RIGHT INGUINAL HERNORRHAPHY WITH MESH;  Surgeon: Aviva Signs, MD;  Location: [facility];  Service: General;  Laterality: Right;   JOINT REPLACEMENT Right    hip    PERIPHERAL VASCULAR INTERVENTION Right 10/18/2020   Procedure: PERIPHERAL VASCULAR INTERVENTION;  Surgeon: Waynetta Sandy, MD;  Location: [facility];  Service: Cardiovascular;  Laterality: Right;   POLYPECTOMY  11/15/2017   Procedure:  POLYPECTOMY;  Surgeon: Daneil Dolin, MD;  Location: [facility];  Service: Endoscopy;;  polyp cs splenic flexure times 2,  cecal polyp   SHOULDER SURGERY Right    x 3    Patient Active Problem List   Diagnosis Date Noted   Glenohumeral arthritis, right 11/03/2021   GERD (gastroesophageal reflux disease) 10/15/2017   Constipation 01/09/2017   Gastrointestinal hemorrhage    Acute gastric ulcer    Acute esophagitis    GI bleed 10/04/2016   Polysubstance abuse ([place]) 10/04/2016    Tobacco dependence 10/04/2016   Cervical spondylosis 12/18/2013   Adjustment disorder 12/20/2011    ONSET DATE: August 2022  REFERRING DIAG: right shoulder pain  THERAPY DIAG:  Acute pain of right shoulder - Plan: Ot plan of care cert/re-cert  Stiffness of right shoulder, not elsewhere classified - Plan: Ot plan of care cert/re-cert  Other symptoms and signs involving the musculoskeletal system - Plan: Ot plan of care cert/re-cert  SUBJECTIVE:   SUBJECTIVE STATEMENT: S: I was supposed to have surgery yesterday.    PERTINENT HISTORY: Pt is a 66 y/o male presenting with right shoulder pain present since August 2022. Pt with a hx of right RCR approximately 40 year ago.   PRECAUTIONS: None  WEIGHT BEARING RESTRICTIONS No  PAIN:  Are you having pain? Yes: NPRS scale: 6/10 Pain location: right shoulder Pain description: throbbing, toothache Aggravating factors: weather, any movement  Relieving factors: rest, sitting still  FALLS: Has patient fallen in last 6 months? No  PATIENT GOALS To have surgery and decrease the pain  OBJECTIVE:   HAND DOMINANCE: Right  ADLs: Overall ADLs: Pt is having difficulty with dressing, reaching up and behind back, peri-care, unable to lift items with right arm. Sneezing and hiccups cause pain. Pt with difficulty sleeping.   FUNCTIONAL OUTCOME MEASURES: FOTO: N/A  UE ROM     Active ROM Right 12/02/2021  Shoulder flexion 63  Shoulder abduction 56  Shoulder internal rotation 90  Shoulder external rotation 13  (Blank rows = not tested)  Passive ROM Right 12/02/2021  Shoulder flexion 58  Shoulder abduction 40  Shoulder internal rotation 90  Shoulder external rotation -11  (Blank rows = not tested)   UE MMT:     MMT Right 12/02/2021  Shoulder flexion 2-/5  Shoulder abduction 2-/5  Shoulder internal rotation 3-/5  Shoulder external rotation 2-/5  (Blank rows = not tested)    COGNITION: Overall cognitive status: Within  functional limits for tasks assessed    PATIENT EDUCATION: Education details: table slides Person educated: Patient Education method: Explanation, Demonstration, and Handouts Education comprehension: verbalized understanding and returned demonstration   HOME EXERCISE PROGRAM:  Eval: table slides-flexion, er/IR  GOALS: Goals reviewed with patient? Yes  SHORT TERM GOALS: Target date: 12/30/2021  Pt will be provided with and educated on HEP to improve mobility of RUE required for use as dominant during ADL completion.   Goal status: INITIAL  2.  Pt will decrease pain in RUE to 6/10 or less to improve ability to use RUE as assist during dressing and bathing tasks.   Goal status: INITIAL     ASSESSMENT:  CLINICAL IMPRESSION: Patient is a 66 y.o. male who was seen today for occupational therapy evaluation for right shoulder pain. Pt with remote hx of right RCR approximately 40 years ago. Reports he pulled on a lawnmower cord in August 2022 and has been having pain since that time. Pt presents with arm hanging by his  side, OT notes pain and weakness when lifting RUE to wash hands at sink. Pt with poor ROM and strength, pain limited in all tasks. Pt with mod to max fascial restrictions throughout RUE, guarding during all testing. Pt was scheduled for reverse TSA surgery on 12/01/21, insurance did not approve as pt had not been to therapy yet. At this time, recommend follow up with MD and insurance and proceed with scheduled surgery, therapy after reverse TSA completion.   PERFORMANCE DEFICITS in functional skills including ADLs, IADLs, ROM, strength, pain, fascial restrictions, muscle spasms, endurance, and UE functional use  IMPAIRMENTS are limiting patient from ADLs, IADLs, rest and sleep, and leisure.   COMORBIDITIES has no other co-morbidities that affects occupational performance. Patient will benefit from skilled OT to address above impairments and improve overall  function.  MODIFICATION OR ASSISTANCE TO COMPLETE EVALUATION: No modification of tasks or assist necessary to complete an evaluation.  OT OCCUPATIONAL PROFILE AND HISTORY: Problem focused assessment: Including review of records relating to presenting problem.  CLINICAL DECISION MAKING: LOW - limited treatment options, no task modification necessary  REHAB POTENTIAL: Poor Recommend pt proceed with reverse TSA prior to therapy  EVALUATION COMPLEXITY: Low      PLAN: OT FREQUENCY: one time visit  OT DURATION: other: 1x visit  PLANNED INTERVENTIONS: self care/ADL training, therapeutic exercise, therapeutic activity, manual therapy, passive range of motion, electrical stimulation, ultrasound, patient/family education, and DME and/or AE instructions  RECOMMENDED OTHER SERVICES: proceed with right reverse TSA  CONSULTED AND AGREED WITH PLAN OF CARE: Patient  PLAN FOR NEXT SESSION: One time visit as pt will not tolerate therapy at this time due to pain, ROM and strength deficits needing right reverse TSA. Pt to return to MD, therapy will resume post sx.    Guadelupe Sabin, OTR/L  [phone] 12/02/2021, 1:41 PM

## 2021-12-02 NOTE — Patient Instructions (Signed)
1) SHOULDER: Flexion On Table ? ? ?Place hands on towel placed on table, elbows straight. Lean forward with you upper body, pushing towel away from body.  _10__ reps per set, _2__ sets per day ? ? ? ? ?2) Internal Rotation (Assistive) ? ? ?Seated with elbow bent at right angle and held against side, slide arm on table surface in an inward arc keeping elbow anchored in place. ?Repeat __10__ times. Do _2___ sessions per day. ?Activity: Use this motion to brush crumbs off the table. ? ?Copyright ? [employer]. All rights reserved.  ? ?

## 2021-12-05 ENCOUNTER — Telehealth: Payer: Self-pay

## 2021-12-05 NOTE — Telephone Encounter (Signed)
Pt called in to inquire about getting his surgery done. He states that he got a letter in the mail saying that it was denied and that he went to do a test that checked his motion and had him stretching and was told by a lady that she would send in a letter stating that he was a good candidate for surgery. Please look into this. ?

## 2021-12-07 NOTE — Telephone Encounter (Signed)
Called and let pt know that his case has been submitted again with insurance and we're awaiting a response to schedule his new surgery date. Also let him know that new date will include time needed to stop his blood thinner before hand. Pt verbalized understanding. ?

## 2021-12-15 ENCOUNTER — Telehealth: Payer: Self-pay | Admitting: Radiology

## 2021-12-15 ENCOUNTER — Ambulatory Visit: Payer: Self-pay | Admitting: Orthopedic Surgery

## 2021-12-15 NOTE — Telephone Encounter (Signed)
Surgery approved, I called patient and advised scheduled for 12/22/21.  Dr Amedeo Kinsman to call and post the case.  Patient advised per Dr Amedeo Kinsman, no more plavix after Saturday 12/17/21. ?

## 2021-12-19 NOTE — Patient Instructions (Signed)
? ? ? ? ? ? ? ? Stephen Sullivan ? 12/19/2021  ?  ? '@PREFPERIOPPHARMACY'$ @ ? ? Your procedure is scheduled on  12/22/2021. ? ? Report to [name] at  0600  A.M. ? ? Call this number if you have problems the morning of surgery: ? [phone] ? ? Remember: ? Do not eat after midnight.  You may have clear liquids until 0330 AM on 12/22/2021. ? ?  At 0330 am on 12/22/2021 drink your carb drink. You can have nothing else to drink after this. ?  ? ?  Your last dose of plavix should be 12/17/2021. ? ?  ? Take these medicines the morning of surgery with A SIP OF WATER  ? ?          amlodipine, hydrocodone(if needed),protonix. ?  ? ? Do not wear jewelry, make-up or nail polish. ? Do not wear lotions, powders, or perfumes, or deodorant. ? Do not shave 48 hours prior to surgery.  Men may shave face and neck. ? Do not bring valuables to the hospital. ? [facility] is not responsible for any belongings or valuables. ? ?Contacts, dentures or bridgework may not be worn into surgery.  Leave your suitcase in the car.  After surgery it may be brought to your room. ? ?For patients admitted to the hospital, discharge time will be determined by your treatment team. ? ?Patients discharged the day of surgery will not be allowed to drive home and must  have someone with them for 24 hours.  ? ? ?Special instructions:   DO NOT smoke tobacco or vape for 24 hours before your procedure. ? ?Please read over the following fact sheets that you were given. ?Coughing and Deep Breathing, Surgical Site Infection Prevention, Anesthesia Post-op Instructions, and Care and Recovery After Surgery ?  ? ? ? Reverse Total Shoulder Replacement ?Reverse total shoulder replacement is a surgery to replace the shoulder joint. You may need this surgery if your rotator cuff is torn and cannot be repaired. The rotator cuff is a group of muscles and strong tissues that connect muscle to bone (tendons) in the shoulder joint. The rotator cuff helps you lift your arm. You  may also need a reverse total shoulder replacement if you have: ?A previously unsuccessful normal shoulder replacement. ?Severe pain that keeps you from lifting your arm. ?A severe fracture of your shoulder joint. ?Repeated dislocations of your shoulder joint. ?A tumor in your shoulder joint. ?The shoulder is a ball-and-socket joint. The top of the upper arm bone (humerus) is shaped like a ball, and it fits into the socket of the shoulder blade (scapula). During a normal shoulder replacement, a plastic cup replaces the socket, and a metal ball replaces the ball of the humerus. This allows the rotator cuff to lift the arm, like it normally does. ?During a reverse total shoulder replacement, the plastic socket is placed into the top of the humerus, and the metal ball is placed into the shoulder socket. This means that the positions of the ball and socket are reversed. This lets you use other shoulder muscles to lift your arm, instead of using the rotator cuff. ?Tell a health care provider about: ?Any allergies you have. ?All medicines you are taking, including vitamins, herbs, eye drops, creams, and over-the-counter medicines. ?Any problems you or family members have had with anesthetic medicines. ?Any blood disorders you have. ?Any surgeries you have had. ?Any medical conditions you have. ?Whether you are pregnant or may be  pregnant. ?What are the risks? ?Generally, this is a safe procedure. However, problems may occur, including: ?Infection. ?Bleeding. ?Blood clots. ?Allergic reactions to medicines. ?Damage to nearby structures or organs, such as blood vessels or nerves. ?Problems with the shoulder, such as: ?Loosening of the new shoulder parts over time, which may require replacement. ?Poor return of shoulder movement. ?Shoulder pain. ?The ball and socket separating (dislocation). ?What happens before the procedure? ?Staying hydrated ?Follow instructions from your health care provider about hydration, which may  include: ?Up to 2 hours before the procedure - you may continue to drink clear liquids, such as water, clear fruit juice, black coffee, and plain tea. ? ?Eating and drinking restrictions ?Follow instructions from your health care provider about eating and drinking, which may include: ?8 hours before the procedure - stop eating heavy meals or foods, such as meat, fried foods, or fatty foods. ?6 hours before the procedure - stop eating light meals or foods, such as toast or cereal. ?6 hours before the procedure - stop drinking milk or drinks that contain milk. ?2 hours before the procedure - stop drinking clear liquids. ?Medicines ?Ask your health care provider about: ?Changing or stopping your regular medicines. This is especially important if you are taking diabetes medicines or blood thinners. ?Taking medicines such as aspirin and ibuprofen. These medicines can thin your blood. Do not take these medicines unless your health care provider tells you to take them. ?Taking over-the-counter medicines, vitamins, herbs, and supplements. ?Tests ?You may have tests, such as: ?Blood tests. ?Chest X-rays. ?Heart tests. ?General instructions ?Do not use any products that contain nicotine or tobacco for at least 4 weeks before the procedure. These products include cigarettes, e-cigarettes, and chewing tobacco. If you need help quitting, ask your health care provider. ?Keep your body and teeth clean. Germs from anywhere in your body can travel to your new joint and infect it. Tell your health care provider if you: ?Plan to have dental care and routine cleanings. ?Develop any skin infections. ?Ask your health care provider: ?How your surgery site will be marked. ?What steps will be taken to help prevent infection. These steps may include: ?Removing hair at the surgery site. ?Washing skin with a germ-killing soap. ?Receiving antibiotic medicine. ?Plan to have a responsible adult take you home from the hospital or clinic. ?Plan to  have someone help around the house for a few weeks after your procedure. ?What happens during the procedure? ?An IV will be inserted into one of your veins. ?You will be given one or more of the following: ?A medicine to help you relax (sedative). ?A medicine to numb the area (local anesthetic). ?A medicine to make you fall asleep (general anesthetic). ?A medicine that is injected into an area of your body to numb everything below the injection site (regional anesthetic). ?An incision will be made in the front or the top of your shoulder. ?Your shoulder joint will be opened and cleaned, and the ball of the humerus will be removed from the socket. ?A metal ball will be screwed into your scapula. ?The plastic socket will be inserted into the top of your humerus and held in place. ?The plastic socket will be positioned onto the metal ball and fixed into place. ?Your incision will be closed with stitches (sutures) or staples. ?Your incision will be covered with a bandage (dressing) or other wound covering. ?Your arm will be put in a sling. This will keep your arm still while it heals. ?The procedure may  vary among health care providers and hospitals. ?What happens after the procedure? ?Your blood pressure, heart rate, breathing rate, and blood oxygen level will be monitored until you leave the hospital or clinic. ?You may continue to receive fluids and medicines, such as pain medicines or antibiotics, through an IV. ?You will be shown exercises to do at home to improve movement and strength in your shoulder (physical therapy). ?If you were given a sedative during the procedure, it can affect you for several hours. Do not drive or operate machinery until your health care provider says that it is safe. ?Summary ?A reverse total shoulder replacement may be done if you have shoulder pain due to a rotator cuff tear that cannot be repaired, a severe fracture or repeated dislocations, or a previous unsuccessful shoulder  replacement. ?Follow instructions from your health care provider about eating and drinking before the surgery. ?Plan to have someone take you home and help around the house for a few weeks after your procedure.

## 2021-12-20 ENCOUNTER — Other Ambulatory Visit: Payer: Self-pay

## 2021-12-20 ENCOUNTER — Encounter (HOSPITAL_COMMUNITY): Payer: Self-pay

## 2021-12-20 ENCOUNTER — Encounter (HOSPITAL_COMMUNITY)
Admission: RE | Admit: 2021-12-20 | Discharge: 2021-12-20 | Disposition: A | Discharge status: Home / Self Care | Payer: Medicare HMO | Source: Ambulatory Visit | Attending: Orthopedic Surgery | Admitting: Orthopedic Surgery

## 2021-12-22 ENCOUNTER — Observation Stay (HOSPITAL_COMMUNITY): Payer: Medicare HMO

## 2021-12-22 ENCOUNTER — Encounter (HOSPITAL_COMMUNITY)
Admission: RE | Disposition: A | Discharge status: Home / Self Care | Payer: Self-pay | Source: Home / Self Care | Attending: Orthopedic Surgery

## 2021-12-22 ENCOUNTER — Other Ambulatory Visit: Payer: Self-pay

## 2021-12-22 ENCOUNTER — Ambulatory Visit (HOSPITAL_BASED_OUTPATIENT_CLINIC_OR_DEPARTMENT_OTHER): Payer: Medicare HMO | Admitting: Anesthesiology

## 2021-12-22 ENCOUNTER — Observation Stay (HOSPITAL_COMMUNITY)
Admission: RE | Admit: 2021-12-22 | Discharge: 2021-12-23 | Disposition: A | Discharge status: Home / Self Care | Payer: Medicare HMO | Attending: Orthopedic Surgery | Admitting: Orthopedic Surgery

## 2021-12-22 ENCOUNTER — Ambulatory Visit (HOSPITAL_COMMUNITY): Payer: Medicare HMO | Admitting: Anesthesiology

## 2021-12-22 DIAGNOSIS — K209 Esophagitis, unspecified without bleeding: Secondary | ICD-10-CM

## 2021-12-22 DIAGNOSIS — Z79899 Other long term (current) drug therapy: Secondary | ICD-10-CM | POA: Diagnosis not present

## 2021-12-22 DIAGNOSIS — M19011 Primary osteoarthritis, right shoulder: Secondary | ICD-10-CM

## 2021-12-22 DIAGNOSIS — M47812 Spondylosis without myelopathy or radiculopathy, cervical region: Secondary | ICD-10-CM

## 2021-12-22 DIAGNOSIS — K5900 Constipation, unspecified: Secondary | ICD-10-CM

## 2021-12-22 DIAGNOSIS — I10 Essential (primary) hypertension: Secondary | ICD-10-CM | POA: Diagnosis not present

## 2021-12-22 DIAGNOSIS — F17200 Nicotine dependence, unspecified, uncomplicated: Secondary | ICD-10-CM

## 2021-12-22 DIAGNOSIS — K253 Acute gastric ulcer without hemorrhage or perforation: Secondary | ICD-10-CM

## 2021-12-22 DIAGNOSIS — F4320 Adjustment disorder, unspecified: Secondary | ICD-10-CM

## 2021-12-22 DIAGNOSIS — F1910 Other psychoactive substance abuse, uncomplicated: Secondary | ICD-10-CM

## 2021-12-22 DIAGNOSIS — K219 Gastro-esophageal reflux disease without esophagitis: Secondary | ICD-10-CM

## 2021-12-22 DIAGNOSIS — K922 Gastrointestinal hemorrhage, unspecified: Secondary | ICD-10-CM

## 2021-12-22 HISTORY — PX: REVERSE SHOULDER ARTHROPLASTY: SHX5054

## 2021-12-22 HISTORY — PX: HARDWARE REMOVAL: SHX979

## 2021-12-22 LAB — HEMOGLOBIN AND HEMATOCRIT, BLOOD
HCT: 40.5 % (ref 39.0–52.0)
Hemoglobin: 13.6 g/dL (ref 13.0–17.0)

## 2021-12-22 SURGERY — ARTHROPLASTY, SHOULDER, TOTAL, REVERSE
Anesthesia: General | Site: Shoulder | Laterality: Right

## 2021-12-22 MED ORDER — SODIUM CHLORIDE 0.9 % IR SOLN
Status: DC | PRN
  Administered 2021-12-22: 3000 mL

## 2021-12-22 MED ORDER — SUGAMMADEX SODIUM 200 MG/2ML IV SOLN
INTRAVENOUS | Status: DC | PRN
  Administered 2021-12-22: 154.2 mg via INTRAVENOUS

## 2021-12-22 MED ORDER — MORPHINE SULFATE (PF) 2 MG/ML IV SOLN
0.5000 mg | INTRAVENOUS | Status: DC | PRN
  Administered 2021-12-22: 1 mg via INTRAVENOUS
  Filled 2021-12-22: qty 1

## 2021-12-22 MED ORDER — VANCOMYCIN HCL 1000 MG IV SOLR
INTRAVENOUS | Status: AC
  Filled 2021-12-22: qty 20

## 2021-12-22 MED ORDER — ONDANSETRON HCL 4 MG/2ML IJ SOLN: 4.0000 mg | Freq: Once | INTRAMUSCULAR | Status: DC | PRN

## 2021-12-22 MED ORDER — BUPIVACAINE-EPINEPHRINE (PF) 0.5% -1:200000 IJ SOLN
INTRAMUSCULAR | Status: AC
  Filled 2021-12-22: qty 30

## 2021-12-22 MED ORDER — TRANEXAMIC ACID-NACL 1000-0.7 MG/100ML-% IV SOLN
INTRAVENOUS | Status: DC | PRN
  Administered 2021-12-22: 1000 mg via INTRAVENOUS

## 2021-12-22 MED ORDER — LOSARTAN POTASSIUM-HCTZ 50-12.5 MG PO TABS: 1.0000 | ORAL_TABLET | Freq: Every day | ORAL | Status: DC

## 2021-12-22 MED ORDER — HYDROMORPHONE HCL 1 MG/ML IJ SOLN
0.2500 mg | INTRAMUSCULAR | Status: DC | PRN
  Administered 2021-12-22 (×2): 0.5 mg via INTRAVENOUS
  Filled 2021-12-22 (×2): qty 0.5

## 2021-12-22 MED ORDER — PHENYLEPHRINE HCL-NACL 20-0.9 MG/250ML-% IV SOLN
INTRAVENOUS | Status: DC | PRN
  Administered 2021-12-22: 40 ug/min via INTRAVENOUS

## 2021-12-22 MED ORDER — CHLORHEXIDINE GLUCONATE 0.12 % MT SOLN
15.0000 mL | Freq: Once | OROMUCOSAL | Status: AC
  Administered 2021-12-22: 15 mL via OROMUCOSAL

## 2021-12-22 MED ORDER — LACTATED RINGERS IV SOLN: INTRAVENOUS | Status: DC

## 2021-12-22 MED ORDER — OXYCODONE HCL 5 MG PO TABS: 5.0000 mg | ORAL_TABLET | ORAL | Status: DC | PRN

## 2021-12-22 MED ORDER — ONDANSETRON HCL 4 MG/2ML IJ SOLN
4.0000 mg | Freq: Four times a day (QID) | INTRAMUSCULAR | Status: DC | PRN
  Administered 2021-12-22: 4 mg via INTRAVENOUS
  Filled 2021-12-22: qty 2

## 2021-12-22 MED ORDER — FENTANYL CITRATE (PF) 100 MCG/2ML IJ SOLN
100.0000 ug | Freq: Once | INTRAMUSCULAR | Status: AC
  Administered 2021-12-22: 50 ug via INTRAVENOUS
  Administered 2021-12-22: 100 ug via INTRAVENOUS
  Administered 2021-12-22 (×2): 50 ug via INTRAVENOUS
  Administered 2021-12-22: 100 ug via INTRAVENOUS
  Administered 2021-12-22 (×3): 50 ug via INTRAVENOUS

## 2021-12-22 MED ORDER — BUPIVACAINE HCL (PF) 0.5 % IJ SOLN
INTRAMUSCULAR | Status: AC
  Filled 2021-12-22: qty 30

## 2021-12-22 MED ORDER — 0.9 % SODIUM CHLORIDE (POUR BTL) OPTIME
TOPICAL | Status: DC | PRN
  Administered 2021-12-22: 1000 mL

## 2021-12-22 MED ORDER — PROPOFOL 10 MG/ML IV BOLUS
INTRAVENOUS | Status: AC
  Filled 2021-12-22: qty 20

## 2021-12-22 MED ORDER — VANCOMYCIN HCL 1000 MG IV SOLR
INTRAVENOUS | Status: DC | PRN
  Administered 2021-12-22: 1000 mg

## 2021-12-22 MED ORDER — TRANEXAMIC ACID-NACL 1000-0.7 MG/100ML-% IV SOLN
INTRAVENOUS | Status: AC
  Filled 2021-12-22: qty 100

## 2021-12-22 MED ORDER — MIDAZOLAM HCL 2 MG/2ML IJ SOLN
2.0000 mg | Freq: Once | INTRAMUSCULAR | Status: AC
  Administered 2021-12-22: 2 mg via INTRAVENOUS

## 2021-12-22 MED ORDER — PROPOFOL 10 MG/ML IV BOLUS
INTRAVENOUS | Status: DC | PRN
  Administered 2021-12-22: 150 mg via INTRAVENOUS
  Administered 2021-12-22: 50 mg via INTRAVENOUS

## 2021-12-22 MED ORDER — HYDROCHLOROTHIAZIDE 12.5 MG PO TABS
12.5000 mg | ORAL_TABLET | Freq: Every day | ORAL | Status: DC
  Administered 2021-12-23: 12.5 mg via ORAL
  Filled 2021-12-22: qty 1

## 2021-12-22 MED ORDER — CLOPIDOGREL BISULFATE 75 MG PO TABS
75.0000 mg | ORAL_TABLET | Freq: Every day | ORAL | Status: DC
  Administered 2021-12-23: 75 mg via ORAL
  Filled 2021-12-22: qty 1

## 2021-12-22 MED ORDER — CEFAZOLIN SODIUM-DEXTROSE 2-4 GM/100ML-% IV SOLN
2.0000 g | INTRAVENOUS | Status: AC
  Administered 2021-12-22: 2 g via INTRAVENOUS

## 2021-12-22 MED ORDER — LIDOCAINE HCL (PF) 1 % IJ SOLN
INTRAMUSCULAR | Status: AC
  Filled 2021-12-22: qty 30

## 2021-12-22 MED ORDER — LOSARTAN POTASSIUM 50 MG PO TABS
50.0000 mg | ORAL_TABLET | Freq: Every day | ORAL | Status: DC
  Administered 2021-12-23: 50 mg via ORAL
  Filled 2021-12-22: qty 1

## 2021-12-22 MED ORDER — MEPERIDINE HCL 50 MG/ML IJ SOLN: 6.2500 mg | INTRAMUSCULAR | Status: DC | PRN

## 2021-12-22 MED ORDER — CEFAZOLIN SODIUM-DEXTROSE 2-4 GM/100ML-% IV SOLN
INTRAVENOUS | Status: AC
  Filled 2021-12-22: qty 100

## 2021-12-22 MED ORDER — AMLODIPINE BESYLATE 5 MG PO TABS
10.0000 mg | ORAL_TABLET | Freq: Every day | ORAL | Status: DC
  Administered 2021-12-23: 10 mg via ORAL
  Filled 2021-12-22: qty 2

## 2021-12-22 MED ORDER — PROPOFOL 10 MG/ML IV BOLUS
INTRAVENOUS | Status: AC
Start: 2021-12-22 — End: ?
  Filled 2021-12-22: qty 20

## 2021-12-22 MED ORDER — ACETAMINOPHEN 325 MG PO TABS: 325.0000 mg | ORAL_TABLET | Freq: Four times a day (QID) | ORAL | Status: DC | PRN

## 2021-12-22 MED ORDER — FENTANYL CITRATE (PF) 100 MCG/2ML IJ SOLN
INTRAMUSCULAR | Status: AC
  Filled 2021-12-22: qty 2

## 2021-12-22 MED ORDER — ORAL CARE MOUTH RINSE: 15.0000 mL | Freq: Once | OROMUCOSAL | Status: AC

## 2021-12-22 MED ORDER — LIDOCAINE HCL (CARDIAC) PF 100 MG/5ML IV SOSY
PREFILLED_SYRINGE | INTRAVENOUS | Status: DC | PRN
  Administered 2021-12-22: 40 mg via INTRATRACHEAL

## 2021-12-22 MED ORDER — DEXAMETHASONE SODIUM PHOSPHATE 4 MG/ML IJ SOLN
INTRAMUSCULAR | Status: AC
  Filled 2021-12-22: qty 1

## 2021-12-22 MED ORDER — PHENYLEPHRINE HCL-NACL 20-0.9 MG/250ML-% IV SOLN
INTRAVENOUS | Status: AC
  Filled 2021-12-22: qty 250

## 2021-12-22 MED ORDER — PANTOPRAZOLE SODIUM 40 MG PO TBEC
40.0000 mg | DELAYED_RELEASE_TABLET | Freq: Two times a day (BID) | ORAL | Status: DC
Start: 2021-12-22 — End: 2021-12-23
  Administered 2021-12-22 – 2021-12-23 (×2): 40 mg via ORAL
  Filled 2021-12-22 (×2): qty 1

## 2021-12-22 MED ORDER — ROCURONIUM BROMIDE 100 MG/10ML IV SOLN
INTRAVENOUS | Status: DC | PRN
  Administered 2021-12-22: 50 mg via INTRAVENOUS
  Administered 2021-12-22: 30 mg via INTRAVENOUS
  Administered 2021-12-22: 50 mg via INTRAVENOUS

## 2021-12-22 MED ORDER — MIDAZOLAM HCL 2 MG/2ML IJ SOLN
INTRAMUSCULAR | Status: AC
  Filled 2021-12-22: qty 2

## 2021-12-22 MED ORDER — DEXMEDETOMIDINE (PRECEDEX) IN NS 20 MCG/5ML (4 MCG/ML) IV SYRINGE
PREFILLED_SYRINGE | INTRAVENOUS | Status: DC | PRN
Start: 2021-12-22 — End: 2021-12-22
  Administered 2021-12-22: 32 ug via INTRAVENOUS

## 2021-12-22 MED ORDER — ONDANSETRON HCL 4 MG/2ML IJ SOLN
INTRAMUSCULAR | Status: DC | PRN
Start: 2021-12-22 — End: 2021-12-22
  Administered 2021-12-22: 4 mg via INTRAVENOUS

## 2021-12-22 MED ORDER — DEXAMETHASONE SODIUM PHOSPHATE 4 MG/ML IJ SOLN
INTRAMUSCULAR | Status: DC | PRN
  Administered 2021-12-22: 4 mg via INTRAVENOUS

## 2021-12-22 MED ORDER — ATORVASTATIN CALCIUM 20 MG PO TABS
20.0000 mg | ORAL_TABLET | Freq: Every day | ORAL | Status: DC
  Administered 2021-12-22: 20 mg via ORAL
  Filled 2021-12-22: qty 1

## 2021-12-22 MED ORDER — LACTATED RINGERS IV BOLUS
1000.0000 mL | Freq: Once | INTRAVENOUS | Status: AC
  Administered 2021-12-22: 1000 mL via INTRAVENOUS

## 2021-12-22 MED ORDER — FENTANYL CITRATE (PF) 250 MCG/5ML IJ SOLN
INTRAMUSCULAR | Status: AC
  Filled 2021-12-22: qty 5

## 2021-12-22 MED ORDER — STERILE WATER FOR IRRIGATION IR SOLN
Status: DC | PRN
  Administered 2021-12-22: 1000 mL

## 2021-12-22 MED ORDER — PROMETHAZINE HCL 12.5 MG PO TABS: 25.0000 mg | ORAL_TABLET | Freq: Four times a day (QID) | ORAL | Status: DC | PRN

## 2021-12-22 MED ORDER — ACETAMINOPHEN 500 MG PO TABS
1000.0000 mg | ORAL_TABLET | Freq: Three times a day (TID) | ORAL | Status: DC
  Administered 2021-12-22 – 2021-12-23 (×3): 1000 mg via ORAL
  Filled 2021-12-22 (×3): qty 2

## 2021-12-22 MED ORDER — DEXAMETHASONE SODIUM PHOSPHATE 4 MG/ML IJ SOLN
INTRAMUSCULAR | Status: DC | PRN
  Administered 2021-12-22: 10 mg via INTRAVENOUS

## 2021-12-22 MED ORDER — BUPIVACAINE HCL (PF) 0.5 % IJ SOLN
INTRAMUSCULAR | Status: DC | PRN
  Administered 2021-12-22: 24 mL via PERINEURAL

## 2021-12-22 MED ORDER — DIPHENHYDRAMINE HCL 12.5 MG/5ML PO ELIX: 12.5000 mg | ORAL_SOLUTION | ORAL | Status: DC | PRN

## 2021-12-22 MED ORDER — OXYCODONE HCL 5 MG PO TABS
10.0000 mg | ORAL_TABLET | ORAL | Status: DC | PRN
  Administered 2021-12-22 – 2021-12-23 (×2): 10 mg via ORAL
  Filled 2021-12-22 (×2): qty 2

## 2021-12-22 MED ORDER — ONDANSETRON HCL 4 MG PO TABS: 4.0000 mg | ORAL_TABLET | Freq: Four times a day (QID) | ORAL | Status: DC | PRN

## 2021-12-22 SURGICAL SUPPLY — 90 items
APL PRP STRL LF DISP 70% ISPRP (MISCELLANEOUS) ×2
BANDAGE ESMARK 4X12 BL STRL LF (DISPOSABLE) ×1 IMPLANT
BASEPLATE AUG FULL 24X20 +2 (Plate) ×1 IMPLANT
BLADE SAW SGTL 83.5X18.5 (BLADE) ×2 IMPLANT
BLADE SURG 15 STRL LF DISP TIS (BLADE) ×1 IMPLANT
BLADE SURG 15 STRL SS (BLADE) ×2
BLADE SURG SZ10 CARB STEEL (BLADE) ×4 IMPLANT
BNDG ESMARK 4X12 BLUE STRL LF (DISPOSABLE) ×2
BNDG GAUZE ELAST 4 BULKY (GAUZE/BANDAGES/DRESSINGS) ×3 IMPLANT
CALIBRATOR GLENOID VIP 5-D (SYSTAGENIX WOUND MANAGEMENT) ×1 IMPLANT
CHLORAPREP W/TINT 26 (MISCELLANEOUS) ×4 IMPLANT
CLOTH BEACON ORANGE TIMEOUT ST (SAFETY) ×2 IMPLANT
COOLER ICEMAN CLASSIC (MISCELLANEOUS) ×2 IMPLANT
COVER LIGHT HANDLE STERIS (MISCELLANEOUS) ×4 IMPLANT
CUFF CRYO UNI SHDR 32X48 (MISCELLANEOUS) ×2 IMPLANT
CUP SUT UNIV REVERS 36 NEUTRAL (Cup) ×1 IMPLANT
DECANTER SPIKE VIAL GLASS SM (MISCELLANEOUS) ×2 IMPLANT
DRAPE HALF SHEET 40X57 (DRAPES) ×2 IMPLANT
DRAPE INCISE IOBAN 44X35 STRL (DRAPES) ×2 IMPLANT
DRAPE SHOULDER BEACH CHAIR (DRAPES) ×2 IMPLANT
DRESSING AQUACEL AG ADV 3.5X12 (MISCELLANEOUS) ×1 IMPLANT
DRSG AQUACEL AG ADV 3.5X12 (MISCELLANEOUS) ×2
DRSG PAD ABDOMINAL 8X10 ST (GAUZE/BANDAGES/DRESSINGS) ×4 IMPLANT
ELECT REM PT RETURN 9FT ADLT (ELECTROSURGICAL) ×2
ELECTRODE REM PT RTRN 9FT ADLT (ELECTROSURGICAL) ×1 IMPLANT
FIBERTAPE CERCLAGE TLINK SUT (SUTURE) ×1 IMPLANT
GLENOSPHERE 36 +4 LAT/24 (Joint) ×1 IMPLANT
GLOVE BIOGEL PI IND STRL 7.0 (GLOVE) ×2 IMPLANT
GLOVE BIOGEL PI IND STRL 8 (GLOVE) IMPLANT
GLOVE BIOGEL PI IND STRL 8.5 (GLOVE) IMPLANT
GLOVE BIOGEL PI INDICATOR 7.0 (GLOVE) ×4
GLOVE BIOGEL PI INDICATOR 8 (GLOVE) ×1
GLOVE BIOGEL PI INDICATOR 8.5 (GLOVE) ×1
GLOVE SKINSENSE NS SZ8.0 LF (GLOVE) ×2
GLOVE SKINSENSE STRL SZ8.0 LF (GLOVE) IMPLANT
GLOVE SRG 8 PF TXTR STRL LF DI (GLOVE) ×1 IMPLANT
GLOVE SURG POLYISO LF SZ8 (GLOVE) ×2 IMPLANT
GLOVE SURG SS PI 7.0 STRL IVOR (GLOVE) ×1 IMPLANT
GLOVE SURG SS PI 8.0 STRL IVOR (GLOVE) ×4 IMPLANT
GLOVE SURG UNDER POLY LF SZ8 (GLOVE) ×2
GOWN STRL REUS W/ TWL XL LVL3 (GOWN DISPOSABLE) ×1 IMPLANT
GOWN STRL REUS W/TWL LRG LVL3 (GOWN DISPOSABLE) ×3 IMPLANT
GOWN STRL REUS W/TWL XL LVL3 (GOWN DISPOSABLE) ×5 IMPLANT
HANDPIECE INTERPULSE COAX TIP (DISPOSABLE) ×2
HOOD W/PEELAWAY (MISCELLANEOUS) ×6 IMPLANT
INSERT HUMERAL 36 +6 (Shoulder) ×1 IMPLANT
INST SET MINOR BONE (KITS) ×2 IMPLANT
IV NS IRRIG 3000ML ARTHROMATIC (IV SOLUTION) ×2 IMPLANT
K-WIRE SNGL END TROCAR 9X.062 (WIRE) ×4
KIT BLADEGUARD II DBL (SET/KITS/TRAYS/PACK) ×1 IMPLANT
KIT POSITION SHOULDER SCHLEI (MISCELLANEOUS) ×2 IMPLANT
KIT STABILIZATION SHOULDER (MISCELLANEOUS) ×2 IMPLANT
KIT TURNOVER KIT A (KITS) ×2 IMPLANT
KWIRE SNGL END TROCAR 9X.062 (WIRE) IMPLANT
MANIFOLD NEPTUNE II (INSTRUMENTS) ×2 IMPLANT
MARKER SKIN DUAL TIP RULER LAB (MISCELLANEOUS) ×2 IMPLANT
NS IRRIG 1000ML POUR BTL (IV SOLUTION) ×2 IMPLANT
PACK BASIC III (CUSTOM PROCEDURE TRAY) ×2
PACK BASIC LIMB (CUSTOM PROCEDURE TRAY) ×2 IMPLANT
PACK SRG BSC III STRL LF ECLPS (CUSTOM PROCEDURE TRAY) IMPLANT
PACK TOTAL JOINT (CUSTOM PROCEDURE TRAY) ×2 IMPLANT
PAD ABD 5X9 TENDERSORB (GAUZE/BANDAGES/DRESSINGS) ×2 IMPLANT
PAD ARMBOARD 7.5X6 YLW CONV (MISCELLANEOUS) ×4 IMPLANT
PAD CAST 4YDX4 CTTN HI CHSV (CAST SUPPLIES) ×1 IMPLANT
PADDING CAST COTTON 4X4 STRL (CAST SUPPLIES) ×2
POST MODULAR 35 (Post) ×1 IMPLANT
REAMER ANGLED HEAD SMALL (DRILL) ×1 IMPLANT
SCREW PERI LOCK 5.5X32 (Screw) ×2 IMPLANT
SCREW PERI NL (Screw) ×1 IMPLANT
SET BASIN LINEN APH (SET/KITS/TRAYS/PACK) ×2 IMPLANT
SET HNDPC FAN SPRY TIP SCT (DISPOSABLE) IMPLANT
SLING ULTRA II L (ORTHOPEDIC SUPPLIES) ×1 IMPLANT
SPONGE T-LAP 18X18 ~~LOC~~+RFID (SPONGE) ×5 IMPLANT
STEM HUMERAL UNI REVERS SZ9 (Stem) ×1 IMPLANT
STRIP CLOSURE SKIN 1/2X4 (GAUZE/BANDAGES/DRESSINGS) ×4 IMPLANT
SUT FIBERWIRE #2 38 T-5 BLUE (SUTURE) ×6
SUT MNCRL AB 4-0 PS2 18 (SUTURE) ×2 IMPLANT
SUT MON AB 2-0 CT1 36 (SUTURE) ×2 IMPLANT
SUT VIC AB 0 CT1 27 (SUTURE) ×2
SUT VIC AB 0 CT1 27XBRD ANTBC (SUTURE) ×1 IMPLANT
SUTURE FIBERWR #2 38 T-5 BLUE (SUTURE) IMPLANT
SUTURETAPE 1.3 40 W/NDL BLK/WH (SUTURE) ×5 IMPLANT
SYR 30ML LL (SYRINGE) ×2 IMPLANT
SYR BULB IRRIG 60ML STRL (SYRINGE) ×2 IMPLANT
TENSIONER FIBERTAPE CERCLAGE (DISPOSABLE) ×1 IMPLANT
TOWEL OR 17X26 4PK STRL BLUE (TOWEL DISPOSABLE) ×2 IMPLANT
TRAY FOLEY W/BAG SLVR 16FR (SET/KITS/TRAYS/PACK) ×2
TRAY FOLEY W/BAG SLVR 16FR ST (SET/KITS/TRAYS/PACK) ×1 IMPLANT
WATER STERILE IRR 1000ML POUR (IV SOLUTION) ×2 IMPLANT
YANKAUER SUCT 12FT TUBE ARGYLE (SUCTIONS) ×2 IMPLANT

## 2021-12-22 NOTE — Transfer of Care (Signed)
Immediate Anesthesia Transfer of Care Note  Patient: Stephen Sullivan  Procedure(s) Performed: REVERSE SHOULDER ARTHROPLASTY (Right: Shoulder) HARDWARE REMOVAL (Right: Shoulder)  Patient Location: PACU  Anesthesia Type:General  Level of Consciousness: awake, alert  and oriented  Airway & Oxygen Therapy: Patient Spontanous Breathing  Post-op Assessment: Report given to RN and Post -op Vital signs reviewed and stable  Post vital signs: Reviewed and stable  Last Vitals:  Vitals Value Taken Time  BP 113/87 12/22/21 1205  Temp    Pulse 70 12/22/21 1211  Resp 18 12/22/21 1211  SpO2 98 % 12/22/21 1211  Vitals shown include unvalidated device data.  Last Pain:  Vitals:   12/22/21 0656  TempSrc: Oral  PainSc: 0-No pain      Patients Stated Pain Goal: 6 (60/04/59 9774)  Complications: No notable events documented.

## 2021-12-22 NOTE — Brief Op Note (Signed)
12/22/2021  12:08 PM  PATIENT:  Stephen Sullivan  66 y.o. male  PRE-OPERATIVE DIAGNOSIS:  Right shoulder glenohumeral arthritis  POST-OPERATIVE DIAGNOSIS:  Right shoulder glenohumeral arthritis  PROCEDURE:  Procedure(s): REVERSE SHOULDER ARTHROPLASTY (Right) HARDWARE REMOVAL (Right)  SURGEON:  Surgeon(s) and Role:    Mordecai Rasmussen, MD - Primary    * Carole Civil, MD - Assisting  PHYSICIAN ASSISTANT:  N/A  ASSISTANTS: none   ANESTHESIA:   regional and general  EBL:  300 mL   BLOOD ADMINISTERED:none  DRAINS: none   LOCAL MEDICATIONS USED:  NONE  SPECIMEN:  No Specimen  DISPOSITION OF SPECIMEN:  N/A  COUNTS:  YES  TOURNIQUET:  * No tourniquets in log *  DICTATION: .Note written in EPIC  PLAN OF CARE: Admit for overnight observation  PATIENT DISPOSITION:  PACU - hemodynamically stable.   Delay start of Pharmacological VTE agent (>24hrs) due to surgical blood loss or risk of bleeding: yes

## 2021-12-22 NOTE — Anesthesia Procedure Notes (Signed)
Procedure Name: Intubation Date/Time: 12/22/2021 7:50 AM Performed by: Minerva Ends, CRNA Pre-anesthesia Checklist: Patient identified, Emergency Drugs available, Suction available and Patient being monitored Patient Re-evaluated:Patient Re-evaluated prior to induction Oxygen Delivery Method: Circle system utilized Preoxygenation: Pre-oxygenation with 100% oxygen Induction Type: IV induction Ventilation: Mask ventilation without difficulty Laryngoscope Size: Mac and 3 Grade View: Grade I Tube type: Oral Tube size: 7.0 mm Number of attempts: 1 Airway Equipment and Method: Stylet and Oral airway Placement Confirmation: ETT inserted through vocal cords under direct vision, positive ETCO2 and breath sounds checked- equal and bilateral Secured at: 23 cm Tube secured with: Tape Dental Injury: Teeth and Oropharynx as per pre-operative assessment

## 2021-12-22 NOTE — H&P (Signed)
Below is the most recent clinic note for Stephen Sullivan; any pertinent information regarding their recent medical history will be updated on the day of surgery.     Orthopaedic Clinic Return - Virtual Telephone Visit   **Visit was conducted via telephone at the patient's request.  No vital signs or physical exam were completed.  All previous medical records were readily available during my conversation with the patient.  In total, I was on the phone with the patient for 15 minutes**   Location: Patient: Home Provider: [place] - 11-20 minutes    Assessment: Stephen Sullivan is a 66 y.o. male with the following: Right shoulder glenohumeral arthritis; in setting of prior surgeries     Plan: I had an extensive discussion with Stephen Sullivan today via telephone in regards to his right shoulder.  He continues to have pain, and severely limited mobility.  His labs are normal, with very little concern for a smoldering infection.  We have obtained a CT scan which demonstrates severe arthritis, without severe deformity.  We discussed the presence of existing hardware, and the challenges of that this can create.  He is aware.   Risks and benefits of the surgery, including, but not limited to infection, bleeding, persistent pain, need for further surgery, fracture, dislocation, instability, non-union, malunion and more severe complications associated with anesthesia were discussed with the patient.  The patient has elected to proceed.   The patient is at an increased risk for fracture, infection, bleeding, instability due to multiple prior surgeries on the right shoulder.   He would like to proceed with surgery on December 01, 2021.  I will ask Dr. Aline Brochure to assist with surgery.  All questions were answered, and he is amenable this plan.  We will confirm medical clearance prior to surgery.  In addition, he is to stop Plavix 5 days prior to surgery.  I will contact him in the week before surgery,  to serve as a reminder.   Follow-up: Return for After surgery.     Subjective:       Chief Complaint  Patient presents with   right shoulder pain      Discuss plan for OR       History of Present Illness: I connected with  Stephen Sullivan on 11/09/21 via telephone and verified that I am speaking with the correct person using two identifiers.   I discussed the limitations of evaluation and management by telemedicine. The patient expressed understanding and agreed to proceed.     Stephen Sullivan is a 66 y.o. male who presents with right shoulder pain.  He has a history of multiple procedures on his right shoulder.  He has gone on to develop severe right glenohumeral arthritis.  He continues to have pain.  He has limited motion.  The pain is constant.  Small movements create sharp pains.  He has obtained a CT scan, and completed basic labs.  He is ready to proceed with surgery.  He would like to get this done as quickly as possible.   Review of Systems: No fevers or chills No numbness or tingling No chest pain No shortness of breath No bowel or bladder dysfunction No GI distress No headaches     Objective: No vital signs   Physical Exam:   Telephone consult - No exam   IMAGING: I personally ordered and reviewed the following images:  No new imaging today

## 2021-12-22 NOTE — Op Note (Signed)
Orthopaedic Surgery Operative Note (CSN: [id number])  GORJE IYER  12-10-1955 Date of Surgery: 12/22/2021   Diagnoses:  Right shoulder glenohumeral arthritis  Procedure: Removal of hardware from proximal right humerus CPT 20680 Right reverse shoulder arthroplasty CPT 23472 AS    Operative Finding Successful completion of the planned procedure.  Metal post removed from proximal humerus without issue.  Patient has significant scarring from prior surgeries, which made exposure more difficult.  There were no complications.  Placed a well fitting reverse shoulder arthroplasty.  Once cerclage tape was used prophylactically.   Post-Op Diagnosis: Same Surgeons:Primary: Mordecai Rasmussen, MD Assisting: Carole Civil, MD Assistants: Carole Civil, MD Location: [facility] OR ROOM 4 Anesthesia: General with regional anesthesia Antibiotics: Ancef 2 g with local vancomycin powder 1 g at the surgical site Tourniquet time: N/A Estimated Blood Loss: 818 cc Complications: None Specimens: None Implants: Implant Name Type Inv. Item Serial No. Manufacturer Lot No. LRB No. Used Action  Screw      Right 1 Explanted  BASEPLATE AUG FULL 29H37 +2 - [id number] Plate BASEPLATE AUG FULL 10F75 +2  [employer] [id number] Right 1 Implanted  SCREW PERI NL - [id number] Screw SCREW PERI NL  [employer] [id number] Right 1 Implanted  POST MODULAR 35 - [id number] Post POST MODULAR 35  [employer] [id number] Right 1 Implanted  SCREW PERI LOCK 5.5X32 - [id number] Screw SCREW PERI LOCK 5.5X32  [employer] [id number] Right 1 Implanted  SCREW PERI LOCK 5.5X32 - [id number] Screw SCREW PERI LOCK 5.5X32  [employer] [id number] Right 1 Implanted  GLENOSPHERE 36 +4 LAT/24 - [id number] Joint GLENOSPHERE 36 +4 LAT/24  [employer] [id number] Right 1 Implanted  FiberTape Cerclage Suture    [employer] [id number] Right 1 Implanted  CUP SUT UNIV REVERS 36 NEUTRAL - [id number] Cup CUP SUT UNIV REVERS 36 NEUTRAL  [employer] [id number] Right 1  Implanted  STEM HUMERAL UNI REVERS SZ9 - [id number] Stem STEM HUMERAL UNI REVERS SZ9  [employer] [id number] Right 1 Implanted  INSERT HUMERAL 36 +6 - [id number] Shoulder INSERT HUMERAL 36 +6  [employer] [id number] Right 1 Implanted    Indications for Surgery:   KAUAN KLOOSTERMAN is a 66 y.o. male who has a remote history of multiple shoulder surgeries, who is gone on to develop severe glenohumeral joint arthritis.  He had done well until recently, and now has severe functional deficits, with limited range of motion.  He has attempted nonoperative management of his shoulder pain.  Benefits and risks of operative and nonoperative management were discussed prior to surgery with the patient and informed consent form was completed.  Specific risks including infection, need for additional surgery, bleeding, damage to surrounding structures, poor healing of the incision, poor integration around the prostheses, dislocation, persistent pain and more severe complications associated with anesthesia.  He elected to proceed.  He obtained surgical clearance from his medical providers.  He has stopped his Plavix 5 days prior to surgery.   Procedure:   The patient was identified properly. Informed consent was obtained and the surgical site was marked. The patient was taken up to suite where general anesthesia was induced.  The patient was positioned in beachchair position, with his arm in an arm holder.  The right arm was prepped and draped in the usual sterile fashion.  Timeout was performed before the beginning of the case.  He received 2 g of Ancef prior to making incision.  He also received a gram of TXA prior  to starting.  He had multiple prior surgical incisions which were all well-healed.  None of them were directly in line with the standard deltopectoral approach, plan for this procedure.  We made an incision starting at the coracoid extending distally approximately 15 cm towards the mid humeral line.  We dissected  sharply through skin.  There was minimal subcutaneous tissue.  We did not encounter the cephalic vein.  The deltopectoral interval was identified, and split bluntly.  We then used electrocautery to incise the clavipectoral fascia.  We used blunt dissection to disrupt adhesions underneath the deltoid.  At this point, we placed a cold retractor.  Overall, the patient had very little external rotation.  This made most of our exposure very difficult.  We identified the subscapularis tendon, and then dissected laterally to expose the metal post from prior surgeries.  He is electrocautery, the surface of this post was cleared.  Soft tissue was cleared circumferentially around the post.  Using a vice grip type instrument, we are able to get a nice firm grasp on the post.  We gently twisted and we will do post free.  There was no bone loss.  There were no identifiable fractures.  The post came out cleanly.  We then irrigated the wound copiously.  There were no metal fragments.  Next, we turned our attention to exposure of the glenohumeral joint.  Due to the extensive scarring and limited range of motion.  We started in the inferior aspect of the subscapularis.  We used electrocautery to expose the leading edge of the subscapularis.  Multiple sutures were placed within the tendon of the subscapularis to gain control of the tendon, and for retraction purposes.  Very deliberately, we continued to release the subscapularis tendon from its footprint.  We continued the dissection up through the rotator interval.  We did not encounter remnants of the biceps.  There were several large osteophytes off the anterior and inferior aspect of the humeral head.  This made our dye exposure even more difficult.  Eventually, we were able to completely release the subscapularis, and the inferior capsule of the humeral head.  Once again, we had limited external rotation.  We used the cutting guide to Omeka Holben out the first cut.  This was  essentially done in situ.  The humeral head cut was completed with a saw, followed by an osteotome.  We were satisfied with the orientation of the cup.  However, due to the extensive wear and tear on the humeral head, which consisted of bare cartilage, as well as firm subchondral bone, as well as many osteophytes, it made exposure to the glenoid very difficult.  We proceeded to make a second humeral head cut, to allow more excursion, and greater exposure to the glenoid.  We continued to dissect the inferior capsule of the joint from the inferior aspect of the humeral head.  We encountered several osteophytes which were removed with rongeurs.  We then dissected bluntly on the anterior aspect of the subscapularis.  We identified the axillary nerve, then proceeded to release the inferior glenohumeral ligament from the glenoid, as well as remaining capsule off the inferior aspect of the glenoid.  This improved our exposure.  We then evaluated the humeral head once again, and noted significant osteophytes which had essentially grown over the bicipital groove, with a small amount of biceps tendon remaining within the groove.  These osteophytes were removed, and a small portion of the biceps tendon was excised.  A cut  protector was placed on the humeral head.  Next, we turned our attention to greater exposure to the glenoid.  We used a curved male to bluntly dissect the subscapularis off the anterior aspect of the glenoid.  This gave [place] full control over the remaining subscapularis.  Retractors were placed in the posterior and superior aspect of the glenoid.  At this point, we had excellent visibility of the entire glenoid.  There was a large, loose inferior osteophyte with diminutive labrum.  This was excised with a rondure.  Due to the extensive anterior and inferior osteophyte, we plan to adjust our preoperative templating.  Using the preoperative template, the drill guide was placed in a center center position.   There was slight inferior tilt.  The pin position was evaluated closely, and once we are satisfied, we used the eccentric reamer for a augmented baseplate.  Greatest portion of the defect was at the 10 o'clock position.  The center post was then drilled to a depth of 35 mm.  The baseplate was then fashioned on the back table, and press-fit into place.  Once again, the augment was at the approximate 10 o'clock position.  We had an excellent fit.  Next, we placed a single nonlocking screw in the superior and posterior position, followed by multiple nonlocking screws.  We did encounter a suture anchor, just anterior to the position of her post, but we did not proceed to remove this, as it would have required potentially extensive bone loss.  Once the baseplate was in position, we noted that the anterior lip of the glenoid was prominent.  This would prevent appropriate positioning of the glenosphere as a result, we used the outer reamer to remove excess bone.  Remaining osteophytes were removed with a rondure.  Once were satisfied with the clearance for the glenosphere, we selected a 36 mm glenosphere and this was secured onto the baseplate.  The central screw was tightened to secure the glenosphere to the baseplate.  Retractors were removed, and we turned our attention to the humeral side.  The arm was externally rotated, and retractors were placed.  We had excellent visibility of the humerus.  The cut protector was removed.  Due to the positioning of the prior implant, which was removed earlier in the case, we made the decision to place a single cerclage tape.  This was positioned around the proximal aspect of the humerus.  The cerclage tape was subsequently tightened to maintain the circumferential forces during reaming.  A canal finder was introduced and we sequentially increased the size of the canal.  There was a large cyst at the entry site of the canal finder.  We then sequentially broached up to a size 9.   This achieved excellent fit.  The post was placed on the broach.  We then reamed in a neutral position.  A trial cup was placed, followed by 3 mm poly-.  Retractors were removed, and the shoulder was reduced.  The positioning of the implants was confirmed to be excellent, however there was a little bit too much laxity.  We then dislocated the shoulder, and placed a 6 mm poly-.  The shoulder was once again reduced, and he had excellent fit.  He easily reached 100 degrees of forward flexion.  90 degrees of abduction.  He had at least 45 degrees of external rotation without concern for dislocation.  The shoulder was once again dislocated.  The trial implants were removed.  We then irrigated the humeral  canal with a Pulsavac.  The appropriately sized implants were opened on the back table.  We then placed a size 9 stem and neutral cup with a 6 mm poly.  The shoulder was reduced, and was deemed to be stable.  The shoulder joint was irrigated copiously with normal saline.  We then placed 1 g of Vancomycin powder overlying the glenohumeral joint.  At this point, we critically evaluated the remaining subscapularis tendon.  Overall, the muscle and tendon were both diminutive.  We did not feel as though we could get a good repair, and the tendon did not easily reach the footprint.  As result, we made the decision not to proceed with a subscapularis repair.  We closed the incision in a multilayer fashion with #1 Vicryl in the deltopectoral interval, followed by 2-0 Monocryl and 4-0 Monocryl in the skin.  Sterile dressing was placed followed by a Polar Care ice machine and a shoulder immobilizer.  The patient was awoken taken to PACU in stable condition.  Dr. Aline Brochure assisted with this case, and was crucial for assistance with the exposure and appropriate retraction due to the extensive scarring.   Post-operative plan:  The patient will be admitted to the floor for overnight observation. He is to remain  nonweightbearing on the right shoulder.  Sling at all times.   DVT prophylaxis -he will resume Plavix 24 hours following surgery.   Pain control with PRN pain medication preferring oral medicines.   Follow up plan will be scheduled in approximately 7-10 days for incision check and XR.

## 2021-12-22 NOTE — Anesthesia Procedure Notes (Signed)
Anesthesia Regional Block: Interscalene brachial plexus block   Pre-Anesthetic Checklist: , timeout performed,  Correct Patient, Correct Site, Correct Laterality,  Correct Procedure, Correct Position, site marked,  Risks and benefits discussed,  Surgical consent,  Pre-op evaluation,  At surgeon's request and post-op pain management  Laterality: Upper and Right  Prep: chloraprep       Needles:  Injection technique: Single-shot  Needle Type: Echogenic Stimulator Needle     Needle Length: 8.3cm  Needle Gauge: 22   Needle insertion depth: 5 cm   Additional Needles:   Procedures:,,,, ultrasound used (permanent image in chart),,    Narrative:  Start time: 12/22/2021 7:20 AM End time: 12/22/2021 7:32 AM Injection made incrementally with aspirations every 5 mL.  Performed by: Personally  Anesthesiologist: Denese Killings, MD  Additional Notes: Block assessed prior to start of surgery

## 2021-12-22 NOTE — Anesthesia Preprocedure Evaluation (Signed)
Anesthesia Evaluation  Patient identified by MRN, date of birth, ID band Patient awake    Reviewed: Allergy & Precautions, NPO status , Patient's Chart, lab work & pertinent test results  Airway Mallampati: II  TM Distance: >3 FB Neck ROM: Full    Dental  (+) Dental Advisory Given, Missing   Pulmonary shortness of breath and with exertion, Current Smoker and Patient abstained from smoking.,    Pulmonary exam normal breath sounds clear to auscultation       Cardiovascular Exercise Tolerance: Good hypertension, Pt. on medications + Peripheral Vascular Disease  Normal cardiovascular exam Rhythm:Regular Rate:Normal     Neuro/Psych  Headaches, PSYCHIATRIC DISORDERS Anxiety    GI/Hepatic Neg liver ROS, PUD, GERD  Medicated,  Endo/Other  negative endocrine ROS  Renal/GU Renal diseaseCreatinine 1.3 in 2022  negative genitourinary   Musculoskeletal  (+) Arthritis , Osteoarthritis,    Abdominal   Peds negative pediatric ROS (+)  Hematology negative hematology ROS (+)   Anesthesia Other Findings On plavix   Reproductive/Obstetrics negative OB ROS                           Anesthesia Physical Anesthesia Plan  ASA: 2  Anesthesia Plan: General   Post-op Pain Management: Regional block* and Dilaudid IV   Induction: Intravenous  PONV Risk Score and Plan: 2 and Ondansetron and Dexamethasone  Airway Management Planned: Oral ETT  Additional Equipment:   Intra-op Plan:   Post-operative Plan: Extubation in OR  Informed Consent: I have reviewed the patients History and Physical, chart, labs and discussed the procedure including the risks, benefits and alternatives for the proposed anesthesia with the patient or authorized representative who has indicated his/her understanding and acceptance.     Dental advisory given  Plan Discussed with: CRNA and Surgeon  Anesthesia Plan Comments:          Anesthesia Quick Evaluation

## 2021-12-22 NOTE — Interval H&P Note (Signed)
History and Physical Interval Note:  12/22/2021 7:19 AM  Stephen Sullivan  has presented today for surgery, with the diagnosis of Right shoulder glenohumeral arthritis.  The various methods of treatment have been discussed with the patient and family. After consideration of risks, benefits and other options for treatment, the patient has consented to  Procedure(s): REVERSE SHOULDER ARTHROPLASTY (Right) HARDWARE REMOVAL (Right) as a surgical intervention.  The patient's history has been reviewed, patient examined, no change in status, stable for surgery.  I have reviewed the patient's chart and labs.  Questions were answered to the patient's satisfaction.    No Plavix for the past 5 days.  Ready for surgery   Mordecai Rasmussen

## 2021-12-22 NOTE — Anesthesia Postprocedure Evaluation (Signed)
Anesthesia Post Note  Patient: Stephen Sullivan  Procedure(s) Performed: REVERSE SHOULDER ARTHROPLASTY (Right: Shoulder) HARDWARE REMOVAL (Right: Shoulder)  Patient location during evaluation: PACU Anesthesia Type: General Level of consciousness: awake and alert and oriented Pain management: pain level controlled Vital Signs Assessment: post-procedure vital signs reviewed and stable Respiratory status: spontaneous breathing, nonlabored ventilation and respiratory function stable Cardiovascular status: blood pressure returned to baseline and stable Postop Assessment: no apparent nausea or vomiting Anesthetic complications: no   No notable events documented.   Last Vitals:  Vitals:   12/22/21 1300 12/22/21 1315  BP: 92/73 99/77  Pulse: 64 63  Resp: 18 (!) 21  Temp:    SpO2: 100% 98%    Last Pain:  Vitals:   12/22/21 1250  TempSrc:   PainSc: 6                  [name] C Briza Bark

## 2021-12-23 DIAGNOSIS — M19011 Primary osteoarthritis, right shoulder: Secondary | ICD-10-CM | POA: Diagnosis not present

## 2021-12-23 MED ORDER — ONDANSETRON HCL 4 MG PO TABS: 4.0000 mg | ORAL_TABLET | Freq: Three times a day (TID) | ORAL | 0 refills | Status: AC | PRN

## 2021-12-23 MED ORDER — OXYCODONE HCL 5 MG PO TABS: 5.0000 mg | ORAL_TABLET | ORAL | 0 refills | Status: AC | PRN

## 2021-12-23 MED ORDER — ACETAMINOPHEN 500 MG PO TABS: 1000.0000 mg | ORAL_TABLET | Freq: Three times a day (TID) | ORAL | 0 refills | Status: AC

## 2021-12-23 NOTE — Care Management Obs Status (Signed)
[facility] NOTIFICATION   Patient Details  Name: Stephen Sullivan MRN: [id number] Date of Birth: [birth year]   Medicare Observation Status Notification Given:  Yes    Tommy Medal 12/23/2021, 8:47 AM

## 2021-12-23 NOTE — Evaluation (Signed)
Physical Therapy Evaluation Patient Details Name: Stephen Sullivan MRN: [id number] DOB: 06-26-1956 Today's Date: 12/23/2021  History of Present Illness  Stephen Sullivan is a 66 y.o. male who presents with right shoulder pain.  He has a history of multiple procedures on his right shoulder.  He has gone on to develop severe right glenohumeral arthritis.  He continues to have pain.  He has limited motion.  The pain is constant.  Small movements create sharp pains.  He has obtained a CT scan, and completed basic labs.  He is ready to proceed with surgery.  He would like to get this done as quickly as possible.    Clinical Impression  Patient functioning near baseline for mobility and gait other than restricted use of RUE s/p R reverse TSA. Patient does not require assist for mobility although does demonstrate slight unsteadiness with ambulation intermittently. He returns to bed at end of session. Patient discharged to care of nursing for ambulation daily as tolerated for length of stay.        Recommendations for follow up therapy are one component of a multi-disciplinary discharge planning process, led by the attending physician.  Recommendations may be updated based on patient status, additional functional criteria and insurance authorization.  Follow Up Recommendations No PT follow up    Assistance Recommended at Discharge PRN  Patient can return home with the following  A little help with bathing/dressing/bathroom;Assistance with cooking/housework    Equipment Recommendations None recommended by PT  Recommendations for Other Services       Functional Status Assessment Patient has had a recent decline in their functional status and demonstrates the ability to make significant improvements in function in a reasonable and predictable amount of time.     Precautions / Restrictions Precautions Precautions: Fall Restrictions Weight Bearing Restrictions: Yes RUE Weight Bearing: Non  weight bearing Other Position/Activity Restrictions: sling at all times      Mobility  Bed Mobility Overal bed mobility: Modified Independent             General bed mobility comments: HOB elevated    Transfers Overall transfer level: Independent Equipment used: None                    Ambulation/Gait Ambulation/Gait assistance: Independent Gait Distance (Feet): 40 Feet Assistive device: None Gait Pattern/deviations: Step-through pattern, Decreased stride length Gait velocity: decreased     General Gait Details: slightly unsteady at times without loss of balance  Stairs            Wheelchair Mobility    Modified Rankin (Stroke Patients Only)       Balance Overall balance assessment: Mild deficits observed, not formally tested                                           Pertinent Vitals/Pain Pain Assessment Faces Pain Scale: Hurts little more Pain Location: neck Pain Descriptors / Indicators: Discomfort, Grimacing Pain Intervention(s): Limited activity within patient's tolerance, Monitored during session, Repositioned    Home Living Family/patient expects to be discharged to:: Private residence Living Arrangements: Alone Available Help at Discharge: Family;Available PRN/intermittently Type of Home: House Home Access: Stairs to enter Entrance Stairs-Rails: Right Entrance Stairs-Number of Steps: 3   Home Layout: One level Home Equipment: Grab bars - toilet;Grab bars - tub/shower;BSC/3in1;Rolling Walker (2 wheels)      Prior  Function Prior Level of Function : Independent/Modified Independent             Mobility Comments: community ambulator without AD ADLs Comments: Independent     Hand Dominance   Dominant Hand: Right    Extremity/Trunk Assessment   Upper Extremity Assessment Upper Extremity Assessment: Defer to OT evaluation RUE Deficits / Details: in sling with NWB status; adjusted sling to 90* at elbow  and for comfort around shoulder. RUE Coordination: decreased fine motor;decreased gross motor    Lower Extremity Assessment Lower Extremity Assessment: Overall WFL for tasks assessed    Cervical / Trunk Assessment Cervical / Trunk Assessment: Normal  Communication   Communication: No difficulties  Cognition Arousal/Alertness: Awake/alert Behavior During Therapy: WFL for tasks assessed/performed Overall Cognitive Status: Within Functional Limits for tasks assessed                                          General Comments      Exercises     Assessment/Plan    PT Assessment Patient does not need any further PT services  PT Problem List         PT Treatment Interventions      PT Goals (Current goals can be found in the Care Plan section)  Acute Rehab PT Goals Patient Stated Goal: return home PT Goal Formulation: With patient Time For Goal Achievement: 12/23/21 Potential to Achieve Goals: Good    Frequency       Co-evaluation PT/OT/SLP Co-Evaluation/Treatment: Yes Reason for Co-Treatment: To address functional/ADL transfers PT goals addressed during session: Mobility/safety with mobility;Balance;Strengthening/ROM;Proper use of DME OT goals addressed during session: ADL's and self-care       AM-PAC PT "6 Clicks" Mobility  Outcome Measure Help needed turning from your back to your side while in a flat bed without using bedrails?: None Help needed moving from lying on your back to sitting on the side of a flat bed without using bedrails?: None Help needed moving to and from a bed to a chair (including a wheelchair)?: None Help needed standing up from a chair using your arms (e.g., wheelchair or bedside chair)?: None Help needed to walk in hospital room?: None Help needed climbing 3-5 steps with a railing? : None 6 Click Score: 24    End of Session   Activity Tolerance: Patient tolerated treatment well Patient left: in bed;with nursing/sitter  in room;with call bell/phone within reach Nurse Communication: Mobility status PT Visit Diagnosis: Other abnormalities of gait and mobility (R26.89)    Time: 0811-0826 PT Time Calculation (min) (ACUTE ONLY): 15 min   Charges:   PT Evaluation $PT Eval Low Complexity: 1 Low          9:20 AM, 12/23/21 Mearl Latin PT, DPT Physical Therapist at [facility]

## 2021-12-23 NOTE — Discharge Summary (Signed)
Patient ID: Stephen Sullivan MRN: [id number] DOB/AGE: [birth year] 66 y.o.  Admit date: 12/22/2021 Discharge date: 12/23/2021  Admission Diagnoses: Right shoulder glenohumeral arthritis  Discharge Diagnoses:  Principal Problem:   Glenohumeral arthritis, right   Past Medical History:  Diagnosis Date   Arthritis    Back pain    Cataracts, bilateral    immature   GERD (gastroesophageal reflux disease)    using Baking Soda and Water per pt   Headache(784.0)    related to neck issues   History of bronchitis    History of gastric ulcer 20+yrs ago   Hypertension    takes Benazepril some days   Joint pain    Muscle spasm    takes Flexeril daily   Shortness of breath    with exertion   Weakness    and numbness in left fingers     Procedures Performed: Right reverse shoulder arthroplasty and removal of hardware  Discharged Condition: good  Hospital Course: Patient brought in as an outpatient for surgery.  Tolerated procedure well.  He was kept for monitoring overnight for pain control and medical monitoring postop and was found to be stable for DC home the morning after surgery.  He was evaluated by PT and OT prior to discharge.  Patient was instructed on specific activity restrictions and all questions were answered.  He was stable for discharge on postoperative day #1.  Consults: None  Significant Diagnostic Studies: No additional pertinent studies  Treatments: Surgery  Discharge Exam:  Vitals:   12/22/21 1431 12/22/21 1810  BP: (!) 110/59 124/85  Pulse: (!) 102 82  Resp: 18 20  Temp: 98.5 F (36.9 C) (!) 97.4 F (36.3 C)  SpO2: 98% 98%     Alert and oriented.  No acute distress.  Sling is fitting appropriately.  Ice pack is in appropriate position. Fingers are warm and well-perfused. He is able to extend his thumb, and flex all of his fingers. Sensation is intact in the median, ulnar and superficial radial nerve distribution. Sensation is intact in the  axillary nerve distribution. Surgical dressing is clean, dry and intact      Latest Ref Rng & Units 12/22/2021   12:20 PM 11/04/2021    8:36 AM 10/18/2020   11:46 AM  CBC  WBC 3.8 - 10.8 Thousand/uL  8.9     Hemoglobin 13.0 - 17.0 g/dL 13.6   15.5   15.0    Hematocrit 39.0 - 52.0 % 40.5   46.2   44.0    Platelets 140 - 400 Thousand/uL  222         Disposition: Discharge disposition: 01-Home or Self Care        Allergies as of 12/23/2021       Reactions   Ibuprofen Other (See Comments)   Causes bleeding   Nsaids Other (See Comments)   Not to take - "busted intestines open"        Medication List     STOP taking these medications    HYDROcodone-acetaminophen 10-325 MG tablet Commonly known as: NORCO       TAKE these medications    acetaminophen 500 MG tablet Commonly known as: TYLENOL Take 2 tablets (1,000 mg total) by mouth every 8 (eight) hours for 14 days.   amLODipine 10 MG tablet Commonly known as: NORVASC Take 10 mg by mouth daily.   atorvastatin 20 MG tablet Commonly known as: LIPITOR Take 20 mg by mouth daily.   CALCIUM PO Take  1 tablet by mouth daily.   clopidogrel 75 MG tablet Commonly known as: PLAVIX TAKE 1 TABLET BY MOUTH EVERY DAY   losartan-hydrochlorothiazide 50-12.5 MG tablet Commonly known as: HYZAAR Take 1 tablet by mouth daily.   ondansetron 4 MG tablet Commonly known as: Zofran Take 1 tablet (4 mg total) by mouth every 8 (eight) hours as needed for up to 14 days for nausea or vomiting.   oxyCODONE 5 MG immediate release tablet Commonly known as: Roxicodone Take 1 tablet (5 mg total) by mouth every 4 (four) hours as needed for up to 7 days.   pantoprazole 40 MG tablet Commonly known as: PROTONIX Take 1 tablet (40 mg total) by mouth daily. What changed: when to take this        Follow-up Information     Mordecai Rasmussen, MD. Call.   Specialties: Orthopedic Surgery, Sports Medicine Why: 10-14 days for suture  removal/incision check Contact information: [address] [place] [address] [phone]

## 2021-12-23 NOTE — Evaluation (Signed)
Occupational Therapy Evaluation Patient Details Name: Stephen Sullivan MRN: [id number] DOB: 11/09/1955 Today's Date: 12/23/2021   History of Present Illness Stephen Sullivan is a 66 y.o. male who presents with right shoulder pain.  He has a history of multiple procedures on his right shoulder.  He has gone on to develop severe right glenohumeral arthritis.  He continues to have pain.  He has limited motion.  The pain is constant.  Small movements create sharp pains.  He has obtained a CT scan, and completed basic labs.  He is ready to proceed with surgery.  He would like to get this done as quickly as possible. (per MD)   Clinical Impression   Pt agreeable to OT evaluation. Pt post R removal of hardware operation. Pt donning sling which was adjusted with education for donning sling and keeping at 90*. Pt able to adjust socks seated at EOB and ambulate in hall without assist. Pt recommended for outpatient OT and PRN assist at home. Pt is not recommended for further acute OT services and will be discharged to care of nursing staff for remaining length of stay.      Recommendations for follow up therapy are one component of a multi-disciplinary discharge planning process, led by the attending physician.  Recommendations may be updated based on patient status, additional functional criteria and insurance authorization.   Follow Up Recommendations  Outpatient OT    Assistance Recommended at Discharge PRN        Functional Status Assessment  Patient has had a recent decline in their functional status and demonstrates the ability to make significant improvements in function in a reasonable and predictable amount of time.  Equipment Recommendations  None recommended by OT    Recommendations for Other Services       Precautions / Restrictions Precautions Precautions: Fall Restrictions Weight Bearing Restrictions: Yes RUE Weight Bearing: Non weight bearing Other Position/Activity  Restrictions: sling at all times      Mobility Bed Mobility Overal bed mobility: Modified Independent             General bed mobility comments: HOB elevated    Transfers Overall transfer level: Independent Equipment used: None               General transfer comment: able to ambulate in hall from EOB and return to EOB independently      Balance Overall balance assessment: Mild deficits observed, not formally tested                                         ADL either performed or assessed with clinical judgement   ADL Overall ADL's : Modified independent                                       General ADL Comments: Mild difficulty but able to reach to B LE to adjust socks seated at EOB. Good mobility without physical assist. Likely able to complete ADL's without assist but with extended time and adaptation of typical routine.     Vision Baseline Vision/History: 1 Wears glasses Ability to See in Adequate Light: 1 Impaired Patient Visual Report: Blurring of vision (Pt reported blurry vision recently that has moslty resolved.) Vision Assessment?: No apparent visual deficits (per observation during functional tasks.)  Pertinent Vitals/Pain Pain Assessment Pain Assessment: Faces Faces Pain Scale: Hurts little more Pain Location: neck Pain Descriptors / Indicators: Discomfort, Grimacing Pain Intervention(s): Limited activity within patient's tolerance, Monitored during session, Repositioned     Hand Dominance Right   Extremity/Trunk Assessment Upper Extremity Assessment Upper Extremity Assessment: RUE deficits/detail RUE Deficits / Details: in sling with NWB status; adjusted sling to 90* at elbow and for comfort around shoulder. RUE Coordination: decreased fine motor;decreased gross motor   Lower Extremity Assessment Lower Extremity Assessment: Defer to PT evaluation   Cervical / Trunk Assessment Cervical /  Trunk Assessment: Normal   Communication Communication Communication: No difficulties   Cognition Arousal/Alertness: Awake/alert Behavior During Therapy: WFL for tasks assessed/performed Overall Cognitive Status: Within Functional Limits for tasks assessed                                                        Home Living Family/patient expects to be discharged to:: Private residence Living Arrangements: Alone Available Help at Discharge: Family;Available PRN/intermittently Type of Home: House Home Access: Stairs to enter [employer] of Steps: 3 Entrance Stairs-Rails: Right Home Layout: One level     Bathroom Shower/Tub: Teacher, early years/pre: Handicapped height Bathroom Accessibility: No   Home Equipment: Grab bars - toilet;Grab bars - tub/shower;BSC/3in1;Rolling Walker (2 wheels)          Prior Functioning/Environment Prior Level of Function : Independent/Modified Independent             Mobility Comments: community ambulator without AD ADLs Comments: Independent        OT Problem List: Decreased strength;Decreased range of motion;Decreased coordination;Impaired UE functional use      OT Treatment/Interventions:      OT Goals(Current goals can be found in the care plan section) Acute Rehab OT Goals Patient Stated Goal: return home  OT Frequency:      Co-evaluation PT/OT/SLP Co-Evaluation/Treatment: Yes Reason for Co-Treatment: To address functional/ADL transfers   OT goals addressed during session: ADL's and self-care      AM-PAC OT "6 Clicks" Daily Activity     Outcome Measure Help from another person eating meals?: None Help from another person taking care of personal grooming?: None Help from another person toileting, which includes using toliet, bedpan, or urinal?: None Help from another person bathing (including washing, rinsing, drying)?: None Help from another person to put on and taking off  regular upper body clothing?: None Help from another person to put on and taking off regular lower body clothing?: None 6 Click Score: 24   End of Session Equipment Utilized During Treatment: Other (comment) (R donjoy sling)  Activity Tolerance: Patient tolerated treatment well Patient left: in bed;with call bell/phone within reach  OT Visit Diagnosis: Muscle weakness (generalized) (M62.81);Other (comment) (Post R shoulder removal of hardware.)                Time: 0810-0826 OT Time Calculation (min): 16 min Charges:  OT General Charges $OT Visit: 1 Visit OT Evaluation $OT Eval Low Complexity: 1 Low  Ashland Osmer OT, MOT  Larey Seat 12/23/2021, 9:03 AM

## 2021-12-23 NOTE — Discharge Instructions (Addendum)
  Merland Holness A. Amedeo Kinsman, MD [facility] [address] [place],  [place]  [address] Phone: [phone] Fax: [phone]    [place] may leave the operative dressing in place until your follow-up appointment. KEEP THE INCISIONS CLEAN AND DRY. There may be a small amount of fluid/bleeding leaking at the surgical site. This is normal after surgery.  If it fills with liquid or blood please call us immediately to change it for you. Use the provided ice machine or Ice packs as often as possible for the first 3-4 days, then as needed for pain relief.  Keep a layer of cloth or a shirt between your skin and the cooling unit to prevent frost bite as it can get very cold.  SHOWERING: - You may shower on Post-Op Day #3.  - The dressing is water resistant but do not scrub it as it may start to peel up.   - You may remove the sling for showering, but keep a water resistant pillow under the arm to keep both the  elbow and shoulder away from the body (mimicking the abduction sling).  - Gently pat the area dry.  - Do not soak the shoulder in water. Do not go swimming in the pool or ocean until your sutures are removed. - KEEP THE INCISIONS CLEAN AND DRY.  EXERCISES Wear the sling at all times except when doing your exercises. You may remove the sling for showering, but keep the arm across the chest or in a secondary sling.    Accidental/Purposeful External Rotation and shoulder flexion (reaching behind you) is to be avoided at all costs for the first month. It is ok to come out of your sling if your are sitting and have assistance for eating.  Do not lift anything heavier than 1 pound until we discuss it further in clinic. Please perform the exercises:   Elbow / Hand / Wrist  Range of Motion Exercises Grip strengthening   REGIONAL ANESTHESIA (NERVE BLOCKS) The anesthesia team may have performed a nerve block  for you if safe in the setting of your care.  This is a great tool used to minimize pain.  Typically the block may start wearing off overnight but the long acting medicine may last for 3-4 days.  The nerve block wearing off can be a challenging period but please utilize your as needed pain medications to try and manage this period.    POST-OP MEDICATIONS- Multimodal approach to pain control In general your pain will be controlled with a combination of substances.  Prescriptions unless otherwise discussed are electronically sent to your pharmacy.  This is a carefully made plan we use to minimize narcotic use.     Acetaminophen - Non-narcotic pain medicine taken on a scheduled basis  Oxycodone - This is a strong narcotic, to be used only on an "as needed" basis for pain. Resume Plavix per your usual schedule Zofran -  take as needed for nausea   FOLLOW-UP If you develop a Fever (>101.5), Redness or Drainage from the surgical incision site, please call our office to arrange for an evaluation. Please call the office to schedule a follow-up appointment for a wound check, 7-10 days post-operatively.

## 2021-12-23 NOTE — Patient Instructions (Signed)
[employer] UltraSling PRO ER/IR A C SIZING AND CONFIGURATION: D BEFORE USING THE DEVICE, PLEASE READ THE INSTRUCTIONS FOR USE COMPLETELY AND CAREFULLY. CORRECT APPLICATION IS VITAL TO THE PROPER FUNCTIONING OF THE DEVICE. B E Arm envelope is shipped in L/XL configuration. To adjust arm envelope to the S/M size, unbuckle both  quick release buckles. Fold in arm envelope and adhere hook and loop tabs on inside of envelope. Arm Envelope: To size the arm envelope, measure elbow crease to base of index finger. APPLICATION: ARM SLING SIZE MEASUREMENT S/M 10.5"-15" (26.7cm-38.1cm) L/XL 15+" (38.1+cm) To ensure proper fit, if possible, product should be sized on patient pre-operatively. Step 1. Open Arm Sling Release quick-release buckle (i) on cushion and forearm strap (ii) on arm envelope. Unfasten  shoulder ring by releasing quick release buckles (iii) at front of arm envelope. (Fig. A) Step 2. Apply Waist Belt Align cushion on the injured side at waist/hip level with elbow fixed at 90. Place cushion so line  on top of cushion is parallel to the front of your body (if you stand at a table, the line should  be parallel to the table). Wrap the waist belt around the waist, attaching to quick-release buckle at  front of cushion cushion. Attach the sling to the cushion. (Fig B) Adjust strap and trim as necessary  for proper fit. Step 3. Apply Arm Sling Place forearm into arm envelope as far back as possible. Secure thumb strap (iv) between thumb  and forefinger at front of sling. Secure forearm strap to sling and cushion. (Fig. C) Step 4. Apply Shoulder Ring Place arm through shoulder ring of healthy shoulder. Adjust shoulder ring circumference for  proper fit. (Fig. D) NOTE: The hook and loop closure of shoulder ring should be facing up and  away from patient. Step 5. Fasten the Shoulder Strap Fasten shoulder strap to arm envelope by using quick release buckles (iii). Adjust shoulder strap  for proper fit. (Fig. E) BRACE REMOVAL: Detach quick release buckles, remove forearm strap, thumb strap and waist belt.  Slowly remove brace. Clinician Application Instructions [employer] UltraSling PRO ER/IR Individual results may vary. Neither [employer] nor any of its subsidiaries dispense medical advice. The  contents of this document do not constitute medical, legal, or any other type of professional advice.  Rather, please consult your healthcare professional for information on the courses of treatment, if any,  which may be appropriate for you. [place] [id number] [place] [phone] F [phone] [place], [place] [phone] [place] [email or web address] Hand wash in water (57F/30C), using mild soap. Air dry. NOTE: If not rinsed thoroughly, residual soap may cause irritation and deteriorate material. Do NOT iron. Do NOT tumble dry. Do NOT bleach. MATERIAL CONTENTS: Nylon, Polypropylene, Polyurethane Foam, Cotton, Laminate Foam/Tricot, Polyacetal, Polyester, Acetal. WARRANTY:  [employer] will repair or replace all or part of the unit and its accessories for material or workmanship defects for a period of six months from the date of sale. To the extent the terms of this warranty are inconsistent with local regulations, the provisions of  such local regulations will apply. INTENDED FOR SINGLE PATIENT USE. NOT MADE WITH NATURAL RUBBER LATEX. CLEANING & STORAGE INSTRUCTIONS: INTENDED USER PROFILE: The user should be able to: Read and understand the directions, warnings and cautions. INTENDED USE/INDICATIONS: Post-operative rotator cuff repairs, Bankart procedures, Capsular shifts, Glenohumeral dislocation/sublaxation, Soft tissue  strains/repairs. CONTRAINDICATIONS: None. WARNINGS AND CAUTIONS:  If you experience any pain, swelling, sensation changes, or any unusual reactions while using this product, consult  your medical  professional immediately. WAIST BELT:  The UltraSling  PRO fits waist sizes 25"- 63" (63.5cm-160cm). Adjust waist belt and trim as necessaryfor proper fit. CONVERSION TO SUPPORT LEFT SHOULDER  The UltraSling  PRO is shipped in a right arm injury configuration. To adjust the shoulder ring to a left arm injury configuration, release  quick release buckles at front of arm envelope and detach cushion from arm envelope. Apply cushion to left side of body by flipping  cushion over so smaller end is pointing to back of left elbow. Wrap waist belt around waist and attach arm envelope to cushion. Slip  the shoulder ring over right shoulder. Place arm in sling and reattach quick release buckles. OPTIONAL CUSHION:  The UltraSling  PRO can be worn without the cushion. Remove back of waist belt from cushion and adhere y-strap to both sides of the  arm sling envelope at the elbow. For front of the waist belt, remove quick-release buckle from cushion and adhere y-strap to arm sling  envelope. Trim the waist belt as needed

## 2021-12-26 ENCOUNTER — Encounter (HOSPITAL_COMMUNITY): Payer: Self-pay | Admitting: Orthopedic Surgery

## 2021-12-28 ENCOUNTER — Other Ambulatory Visit: Payer: Self-pay

## 2021-12-28 DIAGNOSIS — Z96611 Presence of right artificial shoulder joint: Secondary | ICD-10-CM

## 2022-01-03 ENCOUNTER — Telehealth: Payer: Self-pay | Admitting: Radiology

## 2022-01-03 MED ORDER — OXYCODONE HCL 5 MG PO TABS: 5.0000 mg | ORAL_TABLET | ORAL | 0 refills | Status: AC | PRN

## 2022-01-03 NOTE — Telephone Encounter (Signed)
Refill provided.  Sent to pharmacy on file.    Coy Vandoren A. Amedeo Kinsman, MD [facility] [address] [place],  [place]  [address] Phone: [phone] Fax: [phone]

## 2022-01-04 ENCOUNTER — Telehealth: Payer: Self-pay

## 2022-01-04 NOTE — Telephone Encounter (Signed)
Pt called saying he is having pain and burning from his left hip to his knee which he only feels when laying down.  He is recovering from shoulder surgery so has been laying down a lot.  Per Gwenette Greet, [place], get pt in with ABI's and a left arterial duplex.  Pt verbalizes understanding and agreed to take the appt offered.

## 2022-01-05 ENCOUNTER — Other Ambulatory Visit: Payer: Self-pay | Admitting: *Deleted

## 2022-01-05 DIAGNOSIS — I739 Peripheral vascular disease, unspecified: Secondary | ICD-10-CM

## 2022-01-05 DIAGNOSIS — I70213 Atherosclerosis of native arteries of extremities with intermittent claudication, bilateral legs: Secondary | ICD-10-CM

## 2022-01-06 ENCOUNTER — Ambulatory Visit (INDEPENDENT_AMBULATORY_CARE_PROVIDER_SITE_OTHER): Payer: Medicare HMO

## 2022-01-06 ENCOUNTER — Ambulatory Visit (INDEPENDENT_AMBULATORY_CARE_PROVIDER_SITE_OTHER): Payer: Medicare HMO | Admitting: Orthopedic Surgery

## 2022-01-06 ENCOUNTER — Encounter: Payer: Self-pay | Admitting: Orthopedic Surgery

## 2022-01-06 DIAGNOSIS — Z96611 Presence of right artificial shoulder joint: Secondary | ICD-10-CM

## 2022-01-06 MED ORDER — GABAPENTIN 100 MG PO CAPS: 100.0000 mg | ORAL_CAPSULE | Freq: Three times a day (TID) | ORAL | 0 refills | Status: DC

## 2022-01-06 NOTE — Patient Instructions (Signed)
Continue to use the sling for 2 more weeks  Ok to remove the pillow  Work with PT  Follow up in 4 weeks.

## 2022-01-06 NOTE — Progress Notes (Signed)
Orthopaedic Postop Note  Assessment: Stephen Sullivan is a 66 y.o. male s/p Right Reverse Shoulder Arthroplasty  DOS: 12/22/2021  Plan: Sutures were trimmed, steri strips were placed Ok to remove the abduction pillow; can stop using the sling around 4 weeks postop Physical therapy prescription and protocol provided Anticipated progression discussed, XR reviewed in clinic Anterior left thigh burning pain, could be related to positioning during surgery.  We will continue to monitor.  Provided gabapentin to help with this pain.  Follow up 4 weeks      Follow-up: Return in about 4 weeks (around 02/03/2022).  XR at next visit: Left shoulder  Subjective:  Chief Complaint  Patient presents with   Shoulder Pain    DO 12/22/21 R Shoulder doing good.    History of Present Illness: Stephen Sullivan is a 66 y.o. male who presents following the above stated procedure.  Surgery was 2 weeks ago.  He has done well.  He has not started working with physical therapy yet.  His first appointment is next week.  He reports burning, numbness and tingling pain in the anterior left thigh.  He did have this prior to surgery, but he says it is worse.  It does not bother him when he is walking.  He has not been using his right arm, but notes some pain when he moves it.  Review of Systems: No fevers or chills No numbness or tingling No Chest Pain No shortness of breath   Objective: There were no vitals taken for this visit.  Physical Exam:  Alert and oriented.  No acute distress.  Surgical incisions healing well.  No surrounding erythema or drainage.  He does have ecchymosis over the upper arm, but this is clearly improving.  Sensation is intact in the axillary nerve distribution.  Full sensation throughout the right hand.  Active motion in the AIN/PIN/U nerve distribution of the right hand.  2+ radial pulse.  Pain with passive motion, but he does tolerate forward flexion to 70 degrees.  Abduction  to 60 degrees.  IMAGING: I personally ordered and reviewed the following images:  XR of the Right shoulder was obtained in clinic today and demonstrates reverse shoulder arthroplasty with implants in good position.  No evidence of acute injury or subsidence of implants.  Previous suture anchors visible.   Impression: Right reverse shoulder arthroplasty in good position   Mordecai Rasmussen, MD 01/06/2022 9:32 AM

## 2022-01-11 ENCOUNTER — Encounter (HOSPITAL_COMMUNITY): Payer: Self-pay | Admitting: Occupational Therapy

## 2022-01-11 ENCOUNTER — Encounter: Payer: Self-pay | Admitting: Physician Assistant

## 2022-01-11 ENCOUNTER — Ambulatory Visit (HOSPITAL_COMMUNITY): Payer: Medicare HMO | Attending: Orthopedic Surgery | Admitting: Occupational Therapy

## 2022-01-11 ENCOUNTER — Encounter (INDEPENDENT_AMBULATORY_CARE_PROVIDER_SITE_OTHER): Payer: Self-pay

## 2022-01-11 ENCOUNTER — Ambulatory Visit (INDEPENDENT_AMBULATORY_CARE_PROVIDER_SITE_OTHER)
Admission: RE | Admit: 2022-01-11 | Discharge: 2022-01-11 | Disposition: A | Discharge status: Home / Self Care | Payer: Medicare HMO | Source: Ambulatory Visit | Attending: Physician Assistant | Admitting: Physician Assistant

## 2022-01-11 ENCOUNTER — Ambulatory Visit: Payer: Medicare HMO | Admitting: Physician Assistant

## 2022-01-11 ENCOUNTER — Ambulatory Visit (HOSPITAL_COMMUNITY)
Admission: RE | Admit: 2022-01-11 | Discharge: 2022-01-11 | Disposition: A | Discharge status: Home / Self Care | Payer: Medicare HMO | Source: Ambulatory Visit | Attending: Physician Assistant | Admitting: Physician Assistant

## 2022-01-11 VITALS — BP 101/70 | HR 63 | Temp 97.3°F | Resp 20 | Ht 70.0 in | Wt 167.0 lb

## 2022-01-11 DIAGNOSIS — R29898 Other symptoms and signs involving the musculoskeletal system: Secondary | ICD-10-CM

## 2022-01-11 DIAGNOSIS — M25611 Stiffness of right shoulder, not elsewhere classified: Secondary | ICD-10-CM

## 2022-01-11 DIAGNOSIS — I739 Peripheral vascular disease, unspecified: Secondary | ICD-10-CM

## 2022-01-11 DIAGNOSIS — I70213 Atherosclerosis of native arteries of extremities with intermittent claudication, bilateral legs: Secondary | ICD-10-CM | POA: Diagnosis not present

## 2022-01-11 DIAGNOSIS — M25511 Pain in right shoulder: Secondary | ICD-10-CM

## 2022-01-11 NOTE — Progress Notes (Signed)
History of Present Illness:  Patient is a 66 y.o. year old male who presents for evaluation of PAD with claudication on the right LE.  S/P endovascular intervention with SFA stent and Common femoral angioplasty on 10/18/20 by Dr. Donzetta Matters.  No history of left LE endovascular/open intervention. He called our triage phone and reported ne left thigh pain.  He states it started 4 weeks ago out of the blue.  The pain is sudden and sharp lateral anterior thigh.  He has no rest pain at night when laying flat or pain with ambulation.  The pain occurs with sitting.  If he stand up from a seated position the pain subsides.   He denies non healing wounds, claudication or true rest pain.   He does stay active and takes daily Plavix and Statin for maximum medical management.   Of note he is recovering from right shoulder surgery since 12/22/21.  He seems to be doing well post op.    Past Medical History:  Diagnosis Date   Arthritis    Back pain    Cataracts, bilateral    immature   GERD (gastroesophageal reflux disease)    using Baking Soda and Water per pt   Headache(784.0)    related to neck issues   History of bronchitis    History of gastric ulcer 20+yrs ago   Hypertension    takes Benazepril some days   Joint pain    Muscle spasm    takes Flexeril daily   Shortness of breath    with exertion   Weakness    and numbness in left fingers    Past Surgical History:  Procedure Laterality Date   ABDOMINAL AORTOGRAM W/LOWER EXTREMITY N/A 10/18/2020   Procedure: ABDOMINAL AORTOGRAM W/LOWER EXTREMITY;  Surgeon: Waynetta Sandy, MD;  Location: [facility];  Service: Cardiovascular;  Laterality: N/A;   ANTERIOR CERVICAL DECOMP/DISCECTOMY FUSION N/A 12/18/2013   Procedure: ANTERIOR CERVICAL DECOMPRESSION/DISCECTOMY FUSION CERVICAL THREE-FOUR, FOUR-FIVE, FIVE-SIX W/ BONEGRAFT;  Surgeon: Hosie Spangle, MD;  Location: [facility];  Service: Neurosurgery;  Laterality: N/A;  ANTERIOR  CERVICAL DECOMPRESSION/DISCECTOMY FUSION CERVICAL THREE-FOUR, FOUR-FIVE, FIVE-SIX W/ BONEGRAFT   arm surgery Left    COLONOSCOPY WITH PROPOFOL N/A 11/15/2017   Procedure: COLONOSCOPY WITH PROPOFOL;  Surgeon: Daneil Dolin, MD;  [facility];  Service: Endoscopy;  Laterality: N/A;  12:30pm   ESOPHAGOGASTRODUODENOSCOPY (EGD) WITH PROPOFOL N/A 10/05/2016   Procedure: ESOPHAGOGASTRODUODENOSCOPY (EGD) WITH PROPOFOL;  Surgeon: Daneil Dolin, MD;  [facility];  Service: Endoscopy;  Laterality: N/A;   ESOPHAGOGASTRODUODENOSCOPY (EGD) WITH PROPOFOL N/A 02/15/2017   Procedure: ESOPHAGOGASTRODUODENOSCOPY (EGD) WITH PROPOFOL;  Surgeon: Daneil Dolin, MD;  [facility];  Service: Endoscopy;  Laterality: N/A;  [place] Right 12/22/2021   Procedure: HARDWARE REMOVAL;  Surgeon: Mordecai Rasmussen, MD;  Location: [facility];  Service: Orthopedics;  Laterality: Right;   INGUINAL HERNIA REPAIR Right 08/18/2016   Procedure: RIGHT INGUINAL HERNORRHAPHY WITH MESH;  Surgeon: Aviva Signs, MD;  Location: [facility];  Service: General;  Laterality: Right;   JOINT REPLACEMENT Right    hip    PERIPHERAL VASCULAR INTERVENTION Right 10/18/2020   Procedure: PERIPHERAL VASCULAR INTERVENTION;  Surgeon: Waynetta Sandy, MD;  Location: [facility];  Service: Cardiovascular;  Laterality: Right;   POLYPECTOMY  11/15/2017   Procedure: POLYPECTOMY;  Surgeon: Daneil Dolin, MD;  [facility];  Service: Endoscopy;;  polyp  cs splenic flexure times 2,  cecal polyp   REVERSE SHOULDER ARTHROPLASTY Right 12/22/2021   Procedure: REVERSE SHOULDER ARTHROPLASTY;  Surgeon: Mordecai Rasmussen, MD;  Location: [facility];  Service: Orthopedics;  Laterality: Right;   SHOULDER SURGERY Right    x 3     ROS:   General:  No weight loss, Fever, chills  HEENT: No recent headaches, no nasal bleeding, no visual changes, no sore throat  Neurologic: No dizziness, blackouts, seizures. No recent  symptoms of stroke or mini- stroke. No recent episodes of slurred speech, or temporary blindness.  Cardiac: No recent episodes of chest pain/pressure, no shortness of breath at rest.  No shortness of breath with exertion.  Denies history of atrial fibrillation or irregular heartbeat  Vascular: No history of rest pain in feet.  No history of claudication.  No history of non-healing ulcer, No history of DVT   Pulmonary: No home oxygen, no productive cough, no hemoptysis,  No asthma or wheezing  Musculoskeletal:  '[ ]'$  Arthritis, '[ ]'$  Low back pain,  '[ ]'$  Joint pain  Hematologic:No history of hypercoagulable state.  No history of easy bleeding.  No history of anemia  Gastrointestinal: No hematochezia or melena,  No gastroesophageal reflux, no trouble swallowing  Urinary: '[ ]'$  chronic Kidney disease, '[ ]'$  on HD - '[ ]'$  MWF or '[ ]'$  TTHS, '[ ]'$  Burning with urination, '[ ]'$  Frequent urination, '[ ]'$  Difficulty urinating;   Skin: No rashes  Psychological: No history of anxiety,  No history of depression  Social History Social History   Tobacco Use   Smoking status: Some Days    Packs/day: 0.10    Years: 47.00    Pack years: 4.70    Types: Cigarettes    Passive exposure: Never   Smokeless tobacco: Never  Substance Use Topics   Alcohol use: No   Drug use: Yes    Types: Marijuana    Family History Family History  Problem Relation Age of Onset   Prostate cancer Father    Prostate cancer Brother    Colon cancer Neg Hx     Allergies  Allergies  Allergen Reactions   Ibuprofen Other (See Comments)    Causes bleeding   Nsaids Other (See Comments)    Not to take - "busted intestines open"     Current Outpatient Medications  Medication Sig Dispense Refill   amLODipine (NORVASC) 10 MG tablet Take 10 mg by mouth daily.     atorvastatin (LIPITOR) 20 MG tablet Take 20 mg by mouth daily.     CALCIUM PO Take 1 tablet by mouth daily.     clopidogrel (PLAVIX) 75 MG tablet TAKE 1 TABLET BY MOUTH  EVERY DAY 90 tablet 3   losartan-hydrochlorothiazide (HYZAAR) 50-12.5 MG tablet Take 1 tablet by mouth daily.     pantoprazole (PROTONIX) 40 MG tablet Take 1 tablet (40 mg total) by mouth daily. (Patient taking differently: Take 40 mg by mouth 2 (two) times daily.) 90 tablet 3   gabapentin (NEURONTIN) 100 MG capsule Take 1 capsule (100 mg total) by mouth 3 (three) times daily. (Patient not taking: Reported on 01/11/2022) 90 capsule 0   No current facility-administered medications for this visit.    Physical Examination  Vitals:   01/11/22 1100  BP: 101/70  Pulse: 63  Resp: 20  Temp: (!) 97.3 F (36.3 C)  TempSrc: Temporal  SpO2: 100%  Weight: 167 lb (75.8 kg)  Height: '5\' 10"'$  (1.778 m)  Body mass index is 23.96 kg/m.  General:  Alert and oriented, no acute distress HEENT: Normal Neck: No bruit or JVD Pulmonary: Clear to auscultation bilaterally Cardiac: Regular Rate and Rhythm without murmur Abdomen: Soft, non-tender, non-distended, no mass, no scars Skin: No rash Extremity Pulses:   bilateral dorsalis pedis palpable pulse Musculoskeletal: No deformity or edema  Neurologic: Left Upper and B lower extremity motor intact  DATA:       +-----------+--------+-----+-------------+----------+--------+  LEFT       PSV cm/sRatioStenosis     Waveform  Comments  +-----------+--------+-----+-------------+----------+--------+  EIA Mid    600          >50% stenosis                    +-----------+--------+-----+-------------+----------+--------+  EIA Distal 72                                            +-----------+--------+-----+-------------+----------+--------+  CFA Prox   75                        biphasic            +-----------+--------+-----+-------------+----------+--------+  CFA Distal 72                        monophasic          +-----------+--------+-----+-------------+----------+--------+  DFA        68                         monophasic          +-----------+--------+-----+-------------+----------+--------+  SFA Prox   77                        monophasic          +-----------+--------+-----+-------------+----------+--------+  SFA Mid    66                        monophasic          +-----------+--------+-----+-------------+----------+--------+  SFA Distal 35                        monophasic          +-----------+--------+-----+-------------+----------+--------+  POP Mid    28                        biphasic            +-----------+--------+-----+-------------+----------+--------+  ATA Distal 30                        biphasic            +-----------+--------+-----+-------------+----------+--------+  PTA Distal 19                        monophasic          +-----------+--------+-----+-------------+----------+--------+  PERO Distal15                        monophasic          +-----------+--------+-----+-------------+----------+--------+        Summary:  Left: Elevated velocity of 600 cm/s  in the mid-distal segment of the left  external iliac vein is suggestive of a >50% stenosis. Left lower extremity  arterial system appears patent.        ABI Findings:  +---------+------------------+-----+--------+--------+  Right    Rt Pressure (mmHg)IndexWaveformComment   +---------+------------------+-----+--------+--------+  PTA      111               1.18 biphasic          +---------+------------------+-----+--------+--------+  DP       103               1.10 biphasic          +---------+------------------+-----+--------+--------+  Great Toe                       Absent            +---------+------------------+-----+--------+--------+   +---------+------------------+-----+----------+-------+  Left     Lt Pressure (mmHg)IndexWaveform  Comment  +---------+------------------+-----+----------+-------+  Brachial 94                                         +---------+------------------+-----+----------+-------+  PTA      91                0.97 monophasic         +---------+------------------+-----+----------+-------+  DP       83                0.88 biphasic           +---------+------------------+-----+----------+-------+  Great Toe                       Absent             +---------+------------------+-----+----------+-------+   +-------+-----------+-----------+------------+------------+  ABI/TBIToday's ABIToday's TBIPrevious ABIPrevious TBI  +-------+-----------+-----------+------------+------------+  Right  1.18       0          0.93        0             +-------+-----------+-----------+------------+------------+  Left   0.97       0          0.86        0             +-------+-----------+-----------+------------+------------+        Summary:  Right: Resting right ankle-brachial index is within normal range. No  evidence of significant right lower extremity arterial disease.   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   ASSESSMENT/PLAN:  PAD with claudication on the right LE.  S/P endovascular intervention with SFA stent and Common femoral angioplasty on 10/18/20 by Dr. Donzetta Matters.  No history of left LE endovascular/open intervention.  The ABI's are stable with palpable DP pulses B.  There is a reported velocity of 600 cm/s EIA left that we will watch with close follow up.  Left LE arterial system is patent.   New left anterior/lateral thigh pain with sitting.  He denies rest pain or non healing wounds.  If he has the left thigh pain he stands up and the pain goes away.  The pain started suddenly without injury, so it may be lumbar stenosis/disc herniation.  He will f/u with  his PCP for further work up.     F/U in 6 months for repeat studies.   If he has trouble or concerns he will call      Roxy Horseman PA-C [facility]: [phone]  MD in clinic [place]

## 2022-01-11 NOTE — Patient Instructions (Signed)
1) SHOULDER: Flexion On Table   Place hands on towel placed on table, elbows straight. Lean forward with you upper body, pushing towel away from body.  _10-15__ reps per set, __3_ sets per day  2) Abduction (Passive)   With arm out to side, resting on towel placed on table with palm DOWN, keeping trunk away from table, lean to the side while pushing towel away from body.  Repeat _10-15___ times. Do __3__ sessions per day.     3) Internal Rotation (Assistive)   Seated with elbow bent at right angle and held against side, slide arm on table surface in an inward arc keeping elbow anchored in place. Repeat __10-15__ times. Do __3__ sessions per day. Activity: Use this motion to brush crumbs off the table.  Copyright  [employer]. All rights reserved.

## 2022-01-11 NOTE — Therapy (Addendum)
OUTPATIENT OCCUPATIONAL THERAPY ORTHO EVALUATION  Patient Name: Stephen Sullivan MRN: [id number] DOB:10-Mar-1956, 66 y.o., male Today's Date: 01/12/2022  PCP: Sharlyne Cai, NP REFERRING PROVIDER: Dr. Larena Glassman   OT End of Session - 01/11/22 1941     Visit Number 1    Number of Visits 16    Date for OT Re-Evaluation 03/12/22   mini-reassessment 02/08/2022   Authorization Type [employer]    Progress Note Due on Visit 10    OT Start Time 1516    OT Stop Time 1546    OT Time Calculation (min) 30 min    Activity Tolerance Patient tolerated treatment well    Behavior During Therapy WFL for tasks assessed/performed             Past Medical History:  Diagnosis Date   Arthritis    Back pain    Cataracts, bilateral    immature   GERD (gastroesophageal reflux disease)    using Baking Soda and Water per pt   Headache(784.0)    related to neck issues   History of bronchitis    History of gastric ulcer 20+yrs ago   Hypertension    takes Benazepril some days   Joint pain    Muscle spasm    takes Flexeril daily   Shortness of breath    with exertion   Weakness    and numbness in left fingers   Past Surgical History:  Procedure Laterality Date   ABDOMINAL AORTOGRAM W/LOWER EXTREMITY N/A 10/18/2020   Procedure: ABDOMINAL AORTOGRAM W/LOWER EXTREMITY;  Surgeon: Waynetta Sandy, MD;  Location: [place] [facility];  Service: Cardiovascular;  Laterality: N/A;   ANTERIOR CERVICAL DECOMP/DISCECTOMY FUSION N/A 12/18/2013   Procedure: ANTERIOR CERVICAL DECOMPRESSION/DISCECTOMY FUSION CERVICAL THREE-FOUR, FOUR-FIVE, FIVE-SIX W/ BONEGRAFT;  Surgeon: Hosie Spangle, MD;  Location: [facility];  Service: Neurosurgery;  Laterality: N/A;  ANTERIOR CERVICAL DECOMPRESSION/DISCECTOMY FUSION CERVICAL THREE-FOUR, FOUR-FIVE, FIVE-SIX W/ BONEGRAFT   arm surgery Left    COLONOSCOPY WITH PROPOFOL N/A 11/15/2017   Procedure: COLONOSCOPY WITH PROPOFOL;  Surgeon: Daneil Dolin, MD;   [facility];  Service: Endoscopy;  Laterality: N/A;  12:30pm   ESOPHAGOGASTRODUODENOSCOPY (EGD) WITH PROPOFOL N/A 10/05/2016   Procedure: ESOPHAGOGASTRODUODENOSCOPY (EGD) WITH PROPOFOL;  Surgeon: Daneil Dolin, MD;  [facility];  Service: Endoscopy;  Laterality: N/A;   ESOPHAGOGASTRODUODENOSCOPY (EGD) WITH PROPOFOL N/A 02/15/2017   Procedure: ESOPHAGOGASTRODUODENOSCOPY (EGD) WITH PROPOFOL;  Surgeon: Daneil Dolin, MD;  [facility];  Service: Endoscopy;  Laterality: N/A;  [place]vin Right 12/22/2021   Procedure: HARDWARE REMOVAL;  Surgeon: Mordecai Rasmussen, MD;  Location: [facility];  Service: Orthopedics;  Laterality: Right;   INGUINAL HERNIA REPAIR Right 08/18/2016   Procedure: RIGHT INGUINAL HERNORRHAPHY WITH MESH;  Surgeon: Aviva Signs, MD;  Location: [facility];  Service: General;  Laterality: Right;   JOINT REPLACEMENT Right    hip    PERIPHERAL VASCULAR INTERVENTION Right 10/18/2020   Procedure: PERIPHERAL VASCULAR INTERVENTION;  Surgeon: Waynetta Sandy, MD;  Location: [place] [facility];  Service: Cardiovascular;  Laterality: Right;   POLYPECTOMY  11/15/2017   Procedure: POLYPECTOMY;  Surgeon: Daneil Dolin, MD;  [facility];  Service: Endoscopy;;  polyp cs splenic flexure times 2,  cecal polyp   REVERSE SHOULDER ARTHROPLASTY Right 12/22/2021   Procedure: REVERSE SHOULDER ARTHROPLASTY;  Surgeon: Mordecai Rasmussen, MD;  [facility]: [facility];  Service: Orthopedics;  Laterality: Right;  SHOULDER SURGERY Right    x 3    Patient Active Problem List   Diagnosis Date Noted   Glenohumeral arthritis, right 11/03/2021   GERD (gastroesophageal reflux disease) 10/15/2017   Constipation 01/09/2017   Gastrointestinal hemorrhage    Acute gastric ulcer    Acute esophagitis    GI bleed 10/04/2016   Polysubstance abuse ([place]) 10/04/2016   Tobacco dependence 10/04/2016   H/O arthrodesis 10/13/2014   Cervical spondylosis 12/18/2013   Neck pain  11/25/2013   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  Pt accompanied by: self and sister  PERTINENT HISTORY: Pt is a 66 y/o male s/p right reverse TSA on 12/22/21. Pt had been experiencing right shoulder pain present since August 2022. Pt with a hx of right RCR approximately 40 year ago. Pt was referred to occupational therapy for evaluation and treatment by Dr. Larena Glassman.   PRECAUTIONS: Shoulder: see protocol  WEIGHT BEARING RESTRICTIONS Yes NWB  PAIN:  Are you having pain? Yes: NPRS scale: 1/10 Pain location: right shoulder Pain description: aching Aggravating factors: movement Relieving factors: pain medication  FALLS: Has patient fallen in last 6 months? No  LIVING ENVIRONMENT: Lives with: lives alone   PLOF: Independent  PATIENT GOALS To be able to use my right arm.   OBJECTIVE:   HAND DOMINANCE: Right  ADLs: Overall ADLs: Pt is unable to use RUE for any ADLs at this time. Has not attempted to use his RUE or let it remain out of the sling for any length of time.    FUNCTIONAL OUTCOME MEASURES: FOTO: 4/100  UPPER EXTREMITY ROM      Assessed supine, er/IR adducted Passive ROM Right eval  Shoulder flexion 80  Shoulder abduction 65  Shoulder internal rotation 90  Shoulder external rotation 30    UPPER EXTREMITY MMT:     Unable to assess due to pain/precautions MMT Right eval  Shoulder flexion   Shoulder abduction   Shoulder internal rotation   Shoulder external rotation   (Blank rows = not  tested)    COGNITION: Overall cognitive status: Within functional limits for tasks assessed    PATIENT EDUCATION: Education details: table slides Person educated: Patient and sister Education method: Explanation, Demonstration, and Handouts Education comprehension: verbalized understanding and returned demonstration   HOME EXERCISE PROGRAM: Eval: table slides  GOALS: Goals reviewed with patient? Yes  SHORT TERM GOALS: Target date: 02/09/2022    Pt will be provided with and educated on HEP to improve mobility required for use of RUE during ADLs.  Goal status: INITIAL  2.  Pt will increase RUE P/ROM to [facility] to improve ability to donn shirts with minimal compensatory strategies.   Goal status: INITIAL  3.   Pt will increase RUE strength to 3/5 or greater to improve ability to reach for items at waist to head height.  Goal status: INITIAL      LONG TERM GOALS: Target date: 03/09/2022    Pt will return to highest level of functioning using RUE as dominant during ADLs.   Goal status: INITIAL  2.  Pt will  decrease pain in RUE to 3/10 or less to improve ability to sleep for 2+ hours without waking due to pain.   Goal status: INITIAL  3.  Pt will increase RUE A/ROM to [facility] to improve ability to reach overhead and behind back during dressing and bathing tasks.   Goal status: INITIAL  4.  Pt will increase RUE strength to 4+/5 or greater to improve ability to return to gym tasks and workouts.   Goal status: INITIAL  5.  Pt will decrease RUE fascial restrictions to min amounts or less to improve ability to perform functional reaching tasks during daily task completion.  Goal status: INITIAL    ASSESSMENT:  CLINICAL IMPRESSION: Patient is a 66 y.o. male who was seen today for occupational therapy evaluation s/p right reverse TSA on 01/01/22. Pt presents in sling, sister present, reports he has not tried to move his arm at all since the surgery. Pt with max fascial  restrictions, some guarding during passive stretching however was able to relax with cuing and allow for good stretch with improvement in ROM.    PERFORMANCE DEFICITS in functional skills including ADLs, IADLs, ROM, strength, pain, fascial restrictions, muscle spasms, endurance, and UE functional use  IMPAIRMENTS are limiting patient from ADLs, IADLs, rest and sleep, and leisure.   COMORBIDITIES has no other co-morbidities that affects occupational performance. Patient will benefit from skilled OT to address above impairments and improve overall function.  MODIFICATION OR ASSISTANCE TO COMPLETE EVALUATION: No modification of tasks or assist necessary to complete an evaluation.  OT OCCUPATIONAL PROFILE AND HISTORY: Problem focused assessment: Including review of records relating to presenting problem.  CLINICAL DECISION MAKING: LOW - limited treatment options, no task modification necessary  REHAB POTENTIAL: Good  EVALUATION COMPLEXITY: Low      PLAN: OT FREQUENCY: 2x/week  OT DURATION: 8 weeks  PLANNED INTERVENTIONS: self care/ADL training, therapeutic exercise, therapeutic activity, manual therapy, passive range of motion, electrical stimulation, ultrasound, moist heat, cryotherapy, patient/family education, and DME and/or AE instructions   CONSULTED AND AGREED WITH PLAN OF CARE: Patient  PLAN FOR NEXT SESSION: Pt will benefit from skilled OT services to decrease pain and fascial restrictions, increase joint ROM, strength, and functional use of RUE during ADLs.   Guadelupe Sabin, OTR/L  [phone] 01/12/2022, 8:27 AM

## 2022-01-13 ENCOUNTER — Ambulatory Visit (HOSPITAL_COMMUNITY): Payer: Medicare HMO | Admitting: Occupational Therapy

## 2022-01-13 ENCOUNTER — Encounter (HOSPITAL_COMMUNITY): Payer: Self-pay | Admitting: Occupational Therapy

## 2022-01-13 DIAGNOSIS — M25511 Pain in right shoulder: Secondary | ICD-10-CM | POA: Diagnosis not present

## 2022-01-13 DIAGNOSIS — M25611 Stiffness of right shoulder, not elsewhere classified: Secondary | ICD-10-CM

## 2022-01-13 DIAGNOSIS — R29898 Other symptoms and signs involving the musculoskeletal system: Secondary | ICD-10-CM

## 2022-01-13 NOTE — Therapy (Signed)
OUTPATIENT OCCUPATIONAL THERAPY TREATMENT NOTE   Patient Name: Stephen Sullivan MRN: [id number] DOB:Apr 25, 1956, 66 y.o., male Today's Date: 01/13/2022  PCP: Dr. Sharlyne Cai  REFERRING PROVIDER: Dr. Larena Glassman   END OF SESSION:   OT End of Session - 01/13/22 1422     Visit Number 2    Number of Visits 16    Date for OT Re-Evaluation 03/12/22   mini-reassessment 02/08/2022   Authorization Type [employer]    Progress Note Due on Visit 10    OT Start Time 1116    OT Stop Time 1200    OT Time Calculation (min) 44 min    Activity Tolerance Patient tolerated treatment well    Behavior During Therapy WFL for tasks assessed/performed             Past Medical History:  Diagnosis Date   Arthritis    Back pain    Cataracts, bilateral    immature   GERD (gastroesophageal reflux disease)    using Baking Soda and Water per pt   Headache(784.0)    related to neck issues   History of bronchitis    History of gastric ulcer 20+yrs ago   Hypertension    takes Benazepril some days   Joint pain    Muscle spasm    takes Flexeril daily   Shortness of breath    with exertion   Weakness    and numbness in left fingers   Past Surgical History:  Procedure Laterality Date   ABDOMINAL AORTOGRAM W/LOWER EXTREMITY N/A 10/18/2020   Procedure: ABDOMINAL AORTOGRAM W/LOWER EXTREMITY;  Surgeon: Waynetta Sandy, MD;  Location: [place] [facility];  Service: Cardiovascular;  Laterality: N/A;   ANTERIOR CERVICAL DECOMP/DISCECTOMY FUSION N/A 12/18/2013   Procedure: ANTERIOR CERVICAL DECOMPRESSION/DISCECTOMY FUSION CERVICAL THREE-FOUR, FOUR-FIVE, FIVE-SIX W/ BONEGRAFT;  Surgeon: Hosie Spangle, MD;  Location: [place] [facility];  Service: Neurosurgery;  Laterality: N/A;  ANTERIOR CERVICAL DECOMPRESSION/DISCECTOMY FUSION CERVICAL THREE-FOUR, FOUR-FIVE, FIVE-SIX W/ BONEGRAFT   arm surgery Left    COLONOSCOPY WITH PROPOFOL N/A 11/15/2017   Procedure: COLONOSCOPY WITH PROPOFOL;  Surgeon:  Daneil Dolin, MD;  [facility];  Service: Endoscopy;  Laterality: N/A;  12:30pm   ESOPHAGOGASTRODUODENOSCOPY (EGD) WITH PROPOFOL N/A 10/05/2016   Procedure: ESOPHAGOGASTRODUODENOSCOPY (EGD) WITH PROPOFOL;  Surgeon: Daneil Dolin, MD;  [facility];  Service: Endoscopy;  Laterality: N/A;   ESOPHAGOGASTRODUODENOSCOPY (EGD) WITH PROPOFOL N/A 02/15/2017   Procedure: ESOPHAGOGASTRODUODENOSCOPY (EGD) WITH PROPOFOL;  Surgeon: Daneil Dolin, MD;  [facility];  Service: Endoscopy;  Laterality: N/A;  Advance Right 12/22/2021   Procedure: HARDWARE REMOVAL;  Surgeon: Mordecai Rasmussen, MD;  [facility]: [facility];  Service: Orthopedics;  Laterality: Right;   INGUINAL HERNIA REPAIR Right 08/18/2016   Procedure: RIGHT INGUINAL HERNORRHAPHY WITH MESH;  Surgeon: Aviva Signs, MD;  Location: [facility];  Service: General;  Laterality: Right;   JOINT REPLACEMENT Right    hip    PERIPHERAL VASCULAR INTERVENTION Right 10/18/2020   Procedure: PERIPHERAL VASCULAR INTERVENTION;  Surgeon: Waynetta Sandy, MD;  Location: [place] [facility];  Service: Cardiovascular;  Laterality: Right;   POLYPECTOMY  11/15/2017   Procedure: POLYPECTOMY;  Surgeon: Daneil Dolin, MD;  [facility];  Service: Endoscopy;;  polyp cs splenic flexure times 2,  cecal polyp   REVERSE SHOULDER ARTHROPLASTY Right 12/22/2021   Procedure: REVERSE SHOULDER ARTHROPLASTY;  Surgeon: Mordecai Rasmussen, MD;  [facility]: [facility];  Service: Orthopedics;  Laterality: Right;   SHOULDER SURGERY Right    x 3    Patient Active Problem List   Diagnosis Date Noted   Glenohumeral arthritis, right 11/03/2021   GERD (gastroesophageal reflux disease) 10/15/2017   Constipation 01/09/2017   Gastrointestinal hemorrhage    Acute gastric ulcer    Acute esophagitis    GI bleed 10/04/2016   Polysubstance abuse ([place]) 10/04/2016   Tobacco dependence 10/04/2016   H/O arthrodesis 10/13/2014   Cervical spondylosis  12/18/2013   Neck pain 11/25/2013   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 Pt had been experiencing right shoulder pain present since August 2022. Pt with a hx of right RCR approximately 40 year ago. Pt was referred to occupational therapy for evaluation and treatment by Dr. Larena Glassman.     PRECAUTIONS: shoulder, see protocol  SUBJECTIVE: I have had about 30 sharp pains since this morning  PAIN:  Are you having pain? Yes: NPRS scale: 4/10 Pain location: right shoulder Pain description: burning Aggravating factors: using shoulder, movement  Relieving factors: ice, rest      OBJECTIVE:   FUNCTIONAL OUTCOME MEASURES: FOTO: 4/100   UPPER EXTREMITY ROM       Assessed supine, er/IR adducted Passive ROM Right eval  Shoulder flexion 80  Shoulder abduction 65  Shoulder internal rotation 90  Shoulder external rotation 30      UPPER EXTREMITY MMT:      Unable to assess due to pain/precautions MMT Right eval  Shoulder flexion    Shoulder abduction    Shoulder internal rotation    Shoulder external rotation    (Blank rows = not tested)   TODAY'S TREATMENT:  01/13/22 - manual therapy: myofascial release completed to upper arm, front shoulder, pectoralis, and trapezius  - P /ROM: shoulder flexion (50% ROM), abduction (50% ROM), internal/external rotation 10x each direction.  - Therapy ball exercises: seated in chair rollin therapy boll forward into shoulder flexion 10x, rolling therapy ball out to side into shoulder abduction 10x - Isometric  exercises: forwards flexion, external rotation, and abduction (with towel against wall) holding each position for 10 seconds x3 reps each  - pt. Education: educated pt. On donning sling, practiced donning sling   GOALS: Goals reviewed with patient? Yes   SHORT TERM GOALS: Target date: 02/09/2022     Pt will be provided with and educated on HEP to improve mobility required for use of RUE during ADLs.   Goal status: ONGOING   2.  Pt will increase RUE P/ROM to [facility] to improve ability to donn shirts with minimal compensatory strategies.    Goal status: ONGOING   3.   Pt will increase RUE strength to 3/5 or greater to improve ability to reach for items at waist to head height.   Goal status: ONGOING            LONG TERM GOALS: Target date: 03/09/2022     Pt will return to highest level of functioning using RUE as dominant during ADLs.    Goal status: ONGOING    2.  Pt will decrease pain in RUE to 3/10 or less to improve ability to sleep for 2+ hours without waking due to pain.    Goal status: ONGOING    3.  Pt will increase RUE A/ROM to [facility] to improve ability to reach overhead and behind back during dressing and bathing tasks.    Goal status: ONGOING    4.  Pt will increase RUE strength to 4+/5 or greater to improve ability to return to gym tasks and workouts.   Goal status: ONGOING    5.  Pt will decrease RUE fascial restrictions to min amounts or less to improve ability to perform functional reaching tasks during daily task completion.   Goal status: ONGOING       ASSESSMENT:   CLINICAL IMPRESSION: Pt. Reports he has not been wearing his sling due to not knowing how to donn sling correctly. Completed manual therapy at start of session, noted increased fascial restrictions and stiffness. Able to complete 50% P /ROM for shoulder flexion and abduction. Pt. Completed therapy ball stretches while seated in chair to target shoulder flexion and abduction. Educated pt. On donning  and doffing sling, pt. Verbalized understand and showed return demonstration. Verbal cues and tactile cues given throughout session as needed.           PLAN: OT FREQUENCY: 2x/week   OT DURATION: 8 weeks   PLANNED INTERVENTIONS: self care/ADL training, therapeutic exercise, therapeutic activity, manual therapy, passive range of motion, electrical stimulation, ultrasound, moist heat, cryotherapy, patient/family education, and DME and/or AE instructions     CONSULTED AND AGREED WITH PLAN OF CARE: Patient   PLAN FOR NEXT SESSION: P /ROM, isometric exercises, therapy ball exercises   Frederic Jericho, OTR/L 01/13/2022, 2:24 PM

## 2022-01-16 ENCOUNTER — Other Ambulatory Visit: Payer: Self-pay | Admitting: *Deleted

## 2022-01-16 DIAGNOSIS — I70213 Atherosclerosis of native arteries of extremities with intermittent claudication, bilateral legs: Secondary | ICD-10-CM

## 2022-01-16 DIAGNOSIS — I739 Peripheral vascular disease, unspecified: Secondary | ICD-10-CM

## 2022-01-17 ENCOUNTER — Encounter (HOSPITAL_COMMUNITY): Payer: Self-pay | Admitting: Occupational Therapy

## 2022-01-17 ENCOUNTER — Ambulatory Visit (HOSPITAL_COMMUNITY): Payer: Medicare HMO | Attending: Internal Medicine | Admitting: Occupational Therapy

## 2022-01-17 DIAGNOSIS — M19011 Primary osteoarthritis, right shoulder: Secondary | ICD-10-CM | POA: Insufficient documentation

## 2022-01-17 DIAGNOSIS — M25511 Pain in right shoulder: Secondary | ICD-10-CM

## 2022-01-17 DIAGNOSIS — M25611 Stiffness of right shoulder, not elsewhere classified: Secondary | ICD-10-CM

## 2022-01-17 DIAGNOSIS — R29898 Other symptoms and signs involving the musculoskeletal system: Secondary | ICD-10-CM

## 2022-01-17 NOTE — Therapy (Signed)
OUTPATIENT OCCUPATIONAL THERAPY TREATMENT NOTE   Patient Name: Stephen Sullivan MRN: [id number] DOB:1956-08-03, 66 y.o., male Today's Date: 01/17/2022  PCP: Dr. Sharlyne Cai  REFERRING PROVIDER: Dr. Larena Glassman   END OF SESSION:   OT End of Session - 01/17/22 1527     Visit Number 3    Number of Visits 16    Date for OT Re-Evaluation 03/12/22   mini-reassessment 02/08/2022   Authorization Type [employer]    Progress Note Due on Visit 10    OT Start Time 1401   pt arrived late   OT Stop Time 1432    OT Time Calculation (min) 31 min    Activity Tolerance Patient tolerated treatment well    Behavior During Therapy WFL for tasks assessed/performed              Past Medical History:  Diagnosis Date   Arthritis    Back pain    Cataracts, bilateral    immature   GERD (gastroesophageal reflux disease)    using Baking Soda and Water per pt   Headache(784.0)    related to neck issues   History of bronchitis    History of gastric ulcer 20+yrs ago   Hypertension    takes Benazepril some days   Joint pain    Muscle spasm    takes Flexeril daily   Shortness of breath    with exertion   Weakness    and numbness in left fingers   Past Surgical History:  Procedure Laterality Date   ABDOMINAL AORTOGRAM W/LOWER EXTREMITY N/A 10/18/2020   Procedure: ABDOMINAL AORTOGRAM W/LOWER EXTREMITY;  Surgeon: Waynetta Sandy, MD;  Location: [facility];  Service: Cardiovascular;  Laterality: N/A;   ANTERIOR CERVICAL DECOMP/DISCECTOMY FUSION N/A 12/18/2013   Procedure: ANTERIOR CERVICAL DECOMPRESSION/DISCECTOMY FUSION CERVICAL THREE-FOUR, FOUR-FIVE, FIVE-SIX W/ BONEGRAFT;  Surgeon: Hosie Spangle, MD;  Location: [facility];  Service: Neurosurgery;  Laterality: N/A;  ANTERIOR CERVICAL DECOMPRESSION/DISCECTOMY FUSION CERVICAL THREE-FOUR, FOUR-FIVE, FIVE-SIX W/ BONEGRAFT   arm surgery Left    COLONOSCOPY WITH PROPOFOL N/A 11/15/2017   Procedure: COLONOSCOPY WITH  PROPOFOL;  Surgeon: Daneil Dolin, MD;  [facility];  Service: Endoscopy;  Laterality: N/A;  12:30pm   ESOPHAGOGASTRODUODENOSCOPY (EGD) WITH PROPOFOL N/A 10/05/2016   Procedure: ESOPHAGOGASTRODUODENOSCOPY (EGD) WITH PROPOFOL;  Surgeon: Daneil Dolin, MD;  [facility];  Service: Endoscopy;  Laterality: N/A;   ESOPHAGOGASTRODUODENOSCOPY (EGD) WITH PROPOFOL N/A 02/15/2017   Procedure: ESOPHAGOGASTRODUODENOSCOPY (EGD) WITH PROPOFOL;  Surgeon: Daneil Dolin, MD;  [facility];  Service: Endoscopy;  Laterality: N/A;  [place] Right 12/22/2021   Procedure: HARDWARE REMOVAL;  Surgeon: Mordecai Rasmussen, MD;  Location: [facility];  Service: Orthopedics;  Laterality: Right;   INGUINAL HERNIA REPAIR Right 08/18/2016   Procedure: RIGHT INGUINAL HERNORRHAPHY WITH MESH;  Surgeon: Aviva Signs, MD;  Location: [facility];  Service: General;  Laterality: Right;   JOINT REPLACEMENT Right    hip    PERIPHERAL VASCULAR INTERVENTION Right 10/18/2020   Procedure: PERIPHERAL VASCULAR INTERVENTION;  Surgeon: Waynetta Sandy, MD;  Location: [facility];  Service: Cardiovascular;  Laterality: Right;   POLYPECTOMY  11/15/2017   Procedure: POLYPECTOMY;  Surgeon: Daneil Dolin, MD;  [facility];  Service: Endoscopy;;  polyp cs splenic flexure times 2,  cecal polyp   REVERSE SHOULDER ARTHROPLASTY Right 12/22/2021   Procedure: REVERSE SHOULDER ARTHROPLASTY;  Surgeon: Mordecai Rasmussen,  MD;  Location: [facility];  Service: Orthopedics;  Laterality: Right;   SHOULDER SURGERY Right    x 3    Patient Active Problem List   Diagnosis Date Noted   Glenohumeral arthritis, right 11/03/2021   GERD (gastroesophageal reflux disease) 10/15/2017   Constipation 01/09/2017   Gastrointestinal hemorrhage    Acute gastric ulcer    Acute esophagitis    GI bleed 10/04/2016   Polysubstance abuse ([place]) 10/04/2016   Tobacco dependence 10/04/2016   H/O arthrodesis 10/13/2014    Cervical spondylosis 12/18/2013   Neck pain 11/25/2013   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 Pt had been experiencing right shoulder pain present since August 2022. Pt with a hx of right RCR approximately 40 year ago. Pt was referred to occupational therapy for evaluation and treatment by Dr. Larena Glassman.     PRECAUTIONS: shoulder, see protocol  SUBJECTIVE: It was not my fault I was late. I am trying to do my exercises. I felt like that whole thing was going to fall off.   PAIN:  Are you having pain? Yes: NPRS scale: none/10 Pain location: right shoulder Pain description: burning Aggravating factors: using shoulder, movement  Relieving factors: ice, rest      OBJECTIVE:   FUNCTIONAL OUTCOME MEASURES: FOTO: 4/100   UPPER EXTREMITY ROM       Assessed supine, er/IR adducted Passive ROM Right eval  Shoulder flexion 80  Shoulder abduction 65  Shoulder internal rotation 90  Shoulder external rotation 30      UPPER EXTREMITY MMT:      Unable to assess due to pain/precautions MMT Right eval  Shoulder flexion    Shoulder abduction    Shoulder internal rotation    Shoulder external rotation    (Blank rows = not tested)   TODAY'S TREATMENT: 01/17/22 - manual therapy: myofascial release completed to upper arm, front shoulder, pectoralis, and trapezius  - P /ROM: shoulder flexion (50% ROM), abduction (50 to 75 % ROM), internal/external rotation, horizontal abduction and adduction 10x each direction.  - Isometric exercises: forwards  flexion, shoulder adduction, and abduction holding each position for 5 seconds x3 reps each with therapist providing resistance with pt supine on mat.   01/13/22 - manual therapy: myofascial release completed to upper arm, front shoulder, pectoralis, and trapezius  - P /ROM: shoulder flexion (50% ROM), abduction (50% ROM), internal/external rotation 10x each direction.  - Therapy ball exercises: seated in chair rollin therapy boll forward into shoulder flexion 10x, rolling therapy ball out to side into shoulder abduction 10x - Isometric exercises: forwards flexion, external rotation, and abduction (with towel against wall) holding each position for 10 seconds x3 reps each  - pt. Education: educated pt. On donning sling, practiced donning sling   GOALS: Goals reviewed with patient? Yes   SHORT TERM GOALS: Target date: 02/09/2022     Pt will be provided with and educated on HEP to improve mobility required for use of RUE during ADLs.   Goal status: ONGOING   2.  Pt will increase RUE P/ROM to [facility] to improve ability  to donn shirts with minimal compensatory strategies.    Goal status: ONGOING   3.   Pt will increase RUE strength to 3/5 or greater to improve ability to reach for items at waist to head height.   Goal status: ONGOING            LONG TERM GOALS: Target date: 03/09/2022     Pt will return to highest level of functioning using RUE as dominant during ADLs.    Goal status: ONGOING    2.  Pt will decrease pain in RUE to 3/10 or less to improve ability to sleep for 2+ hours without waking due to pain.    Goal status: ONGOING    3.  Pt will increase RUE A/ROM to [facility] to improve ability to reach overhead and behind back during dressing and bathing tasks.    Goal status: ONGOING    4.  Pt will increase RUE strength to 4+/5 or greater to improve ability to return to gym tasks and workouts.   Goal status: ONGOING    5.  Pt will decrease RUE fascial restrictions to min amounts or  less to improve ability to perform functional reaching tasks during daily task completion.   Goal status: ONGOING       ASSESSMENT:   CLINICAL IMPRESSION: Pt reports he has been wearing his sling at night. Pt reports that he is afraid to completed abduction table slides and has not done so much with his HEP. Pt tolerated myofascial release and P/ROM with noted improvement in abduction P/ROM to greater than 50% of available range. Isometrics completed at end of session.         PLAN: OT FREQUENCY: 2x/week   OT DURATION: 8 weeks   PLANNED INTERVENTIONS: self care/ADL training, therapeutic exercise, therapeutic activity, manual therapy, passive range of motion, electrical stimulation, ultrasound, moist heat, cryotherapy, patient/family education, and DME and/or AE instructions     CONSULTED AND AGREED WITH PLAN OF CARE: Patient   PLAN FOR NEXT SESSION: P /ROM, isometric exercises, therapy ball exercises; progress to phase II of protocol.   Eppie Barhorst OT, MOT  Larey Seat, OTR/L 01/17/2022, 3:29 PM

## 2022-01-20 ENCOUNTER — Ambulatory Visit (HOSPITAL_COMMUNITY): Payer: Medicare HMO | Admitting: Occupational Therapy

## 2022-01-20 ENCOUNTER — Encounter (HOSPITAL_COMMUNITY): Payer: Self-pay | Admitting: Occupational Therapy

## 2022-01-20 DIAGNOSIS — M25511 Pain in right shoulder: Secondary | ICD-10-CM

## 2022-01-20 DIAGNOSIS — M25611 Stiffness of right shoulder, not elsewhere classified: Secondary | ICD-10-CM

## 2022-01-20 DIAGNOSIS — R29898 Other symptoms and signs involving the musculoskeletal system: Secondary | ICD-10-CM

## 2022-01-20 NOTE — Therapy (Signed)
OUTPATIENT OCCUPATIONAL THERAPY TREATMENT NOTE   Patient Name: Stephen Sullivan MRN: [id number] DOB:05-20-1956, 66 y.o., male Today's Date: 01/20/2022  PCP: Dr. Sharlyne Cai  REFERRING PROVIDER: Dr. Larena Glassman   END OF SESSION:   OT End of Session - 01/20/22 1359     Visit Number 4    Number of Visits 16    Date for OT Re-Evaluation 03/12/22   mini-reassessment 02/08/2022   Authorization Type [employer]    Progress Note Due on Visit 10    OT Start Time 1303    OT Stop Time 1347    OT Time Calculation (min) 44 min    Activity Tolerance Patient tolerated treatment well    Behavior During Therapy WFL for tasks assessed/performed               Past Medical History:  Diagnosis Date   Arthritis    Back pain    Cataracts, bilateral    immature   GERD (gastroesophageal reflux disease)    using Baking Soda and Water per pt   Headache(784.0)    related to neck issues   History of bronchitis    History of gastric ulcer 20+yrs ago   Hypertension    takes Benazepril some days   Joint pain    Muscle spasm    takes Flexeril daily   Shortness of breath    with exertion   Weakness    and numbness in left fingers   Past Surgical History:  Procedure Laterality Date   ABDOMINAL AORTOGRAM W/LOWER EXTREMITY N/A 10/18/2020   Procedure: ABDOMINAL AORTOGRAM W/LOWER EXTREMITY;  Surgeon: Waynetta Sandy, MD;  Location: [facility];  Service: Cardiovascular;  Laterality: N/A;   ANTERIOR CERVICAL DECOMP/DISCECTOMY FUSION N/A 12/18/2013   Procedure: ANTERIOR CERVICAL DECOMPRESSION/DISCECTOMY FUSION CERVICAL THREE-FOUR, FOUR-FIVE, FIVE-SIX W/ BONEGRAFT;  Surgeon: Hosie Spangle, MD;  Location: [facility];  Service: Neurosurgery;  Laterality: N/A;  ANTERIOR CERVICAL DECOMPRESSION/DISCECTOMY FUSION CERVICAL THREE-FOUR, FOUR-FIVE, FIVE-SIX W/ BONEGRAFT   arm surgery Left    COLONOSCOPY WITH PROPOFOL N/A 11/15/2017   Procedure: COLONOSCOPY WITH PROPOFOL;  Surgeon:  Daneil Dolin, MD;  [facility];  Service: Endoscopy;  Laterality: N/A;  12:30pm   ESOPHAGOGASTRODUODENOSCOPY (EGD) WITH PROPOFOL N/A 10/05/2016   Procedure: ESOPHAGOGASTRODUODENOSCOPY (EGD) WITH PROPOFOL;  Surgeon: Daneil Dolin, MD;  [facility];  Service: Endoscopy;  Laterality: N/A;   ESOPHAGOGASTRODUODENOSCOPY (EGD) WITH PROPOFOL N/A 02/15/2017   Procedure: ESOPHAGOGASTRODUODENOSCOPY (EGD) WITH PROPOFOL;  Surgeon: Daneil Dolin, MD;  [facility];  Service: Endoscopy;  Laterality: N/A;  [place] Right 12/22/2021   Procedure: HARDWARE REMOVAL;  Surgeon: Mordecai Rasmussen, MD;  [facility];  Service: Orthopedics;  Laterality: Right;   INGUINAL HERNIA REPAIR Right 08/18/2016   Procedure: RIGHT INGUINAL HERNORRHAPHY WITH MESH;  Surgeon: Aviva Signs, MD;  Location: [facility];  Service: General;  Laterality: Right;   JOINT REPLACEMENT Right    hip    PERIPHERAL VASCULAR INTERVENTION Right 10/18/2020   Procedure: PERIPHERAL VASCULAR INTERVENTION;  Surgeon: Waynetta Sandy, MD;  Location: [facility];  Service: Cardiovascular;  Laterality: Right;   POLYPECTOMY  11/15/2017   Procedure: POLYPECTOMY;  Surgeon: Daneil Dolin, MD;  [facility];  Service: Endoscopy;;  polyp cs splenic flexure times 2,  cecal polyp   REVERSE SHOULDER ARTHROPLASTY Right 12/22/2021   Procedure: REVERSE SHOULDER ARTHROPLASTY;  Surgeon: Mordecai Rasmussen, MD;  [facility];  Service: Orthopedics;  Laterality: Right;   SHOULDER SURGERY Right    x 3    Patient Active Problem List   Diagnosis Date Noted   Glenohumeral arthritis, right 11/03/2021   GERD (gastroesophageal reflux disease) 10/15/2017   Constipation 01/09/2017   Gastrointestinal hemorrhage    Acute gastric ulcer    Acute esophagitis    GI bleed 10/04/2016   Polysubstance abuse ([place]) 10/04/2016   Tobacco dependence 10/04/2016   H/O arthrodesis 10/13/2014   Cervical spondylosis  12/18/2013   Neck pain 11/25/2013   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 Pt had been experiencing right shoulder pain present since August 2022. Pt with a hx of right RCR approximately 40 year ago. Pt was referred to occupational therapy for evaluation and treatment by Dr. Larena Glassman.     PRECAUTIONS: shoulder, see protocol  SUBJECTIVE: S: I am ready to take my sling off but it is helpful to wear at night.  PAIN:  Are you having pain? Yes: NPRS scale: none/10 Pain location: right shoulder Pain description: burning Aggravating factors: using shoulder, movement  Relieving factors: ice, rest      OBJECTIVE:   FUNCTIONAL OUTCOME MEASURES: FOTO: 4/100   UPPER EXTREMITY ROM       Assessed supine, er/IR adducted Passive ROM Right eval  Shoulder flexion 80  Shoulder abduction 65  Shoulder internal rotation 90  Shoulder external rotation 30      UPPER EXTREMITY MMT:      Unable to assess due to pain/precautions MMT Right eval  Shoulder flexion    Shoulder abduction    Shoulder internal rotation    Shoulder external rotation    (Blank rows = not tested)   PATIENT EDUCATION: Education details: table slides review Person educated: Patient and sister Education method: Consulting civil engineer, Demonstration Education comprehension: verbalized understanding and returned demonstration     HOME EXERCISE PROGRAM: Eval: table slides   TODAY'S TREATMENT:  01/20/22 - manual therapy: myofascial release completed to upper arm, front shoulder,  pectoralis, and trapezius  - P/ROM: shoulder flexion (50% ROM), abduction (50 to 75 % ROM), internal/external rotation, horizontal abduction and adduction 10x each direction.  - Isometric exercises: forwards flexion, shoulder adduction, and abduction holding each position for 5 seconds x5 reps each with therapist providing resistance with pt supine on mat.  -Therapy ball exercises: seated in chair rolling therapy boll forward into shoulder flexion 10x, rolling therapy ball out to side into shoulder abduction 10x   -Scapular A/ROM: row, shoulder elevation/depression, extension, 2x10 reps each movement  -Thumb Tacks, low level, 2x10 repetitions -Table slides: flexion and abduction seated at table, flexion at incline surface, 1x10 repetitions   01/17/22 - manual therapy: myofascial release completed to upper arm, front shoulder, pectoralis, and trapezius  - P/ROM: shoulder flexion (50% ROM), abduction (50 to 75 % ROM), internal/external rotation, horizontal abduction and adduction 10x each direction.  - Isometric exercises: forwards flexion, shoulder adduction, and abduction holding each position for 5 seconds x3 reps each with therapist providing resistance with pt supine on mat.   01/13/22 -  manual therapy: myofascial release completed to upper arm, front shoulder, pectoralis, and trapezius  - P /ROM: shoulder flexion (50% ROM), abduction (50% ROM), internal/external rotation 10x each direction.  - Therapy ball exercises: seated in chair rollin therapy boll forward into shoulder flexion 10x, rolling therapy ball out to side into shoulder abduction 10x - Isometric exercises: forwards flexion, external rotation, and abduction (with towel against wall) holding each position for 10 seconds x3 reps each  - pt. Education: educated pt. On donning sling, practiced donning sling    GOALS: Goals reviewed with patient? Yes   SHORT TERM GOALS: Target date: 02/09/2022     Pt will be provided with and  educated on HEP to improve mobility required for use of RUE during ADLs.   Goal status: ONGOING   2.  Pt will increase RUE P/ROM to [facility] to improve ability to donn shirts with minimal compensatory strategies.    Goal status: ONGOING   3.   Pt will increase RUE strength to 3/5 or greater to improve ability to reach for items at waist to head height.   Goal status: ONGOING            LONG TERM GOALS: Target date: 03/09/2022     Pt will return to highest level of functioning using RUE as dominant during ADLs.    Goal status: ONGOING    2.  Pt will decrease pain in RUE to 3/10 or less to improve ability to sleep for 2+ hours without waking due to pain.    Goal status: ONGOING    3.  Pt will increase RUE A/ROM to [facility] to improve ability to reach overhead and behind back during dressing and bathing tasks.    Goal status: ONGOING    4.  Pt will increase RUE strength to 4+/5 or greater to improve ability to return to gym tasks and workouts.   Goal status: ONGOING    5.  Pt will decrease RUE fascial restrictions to min amounts or less to improve ability to perform functional reaching tasks during daily task completion.   Goal status: ONGOING       ASSESSMENT:   CLINICAL IMPRESSION: Pt reports that he continues to wear his sling at night and is completing exercises at home with some remaining apprehension with abduction table slides. He benefited from myofascial release at start of session with increased P/ROM achieved following. Progressed protocol with introduction of scapular A/ROM with pt tolerating well. Continued isometrics, increased to 5x5 second hold required min verbal at tactile cues for proper position. Completed table slides to review HEP to increase Pt's confidence with exercises.      PLAN: OT FREQUENCY: 2x/week   OT DURATION: 8 weeks   PLANNED INTERVENTIONS: self care/ADL training, therapeutic exercise, therapeutic activity, manual therapy, passive range of  motion, electrical stimulation, ultrasound, moist heat, cryotherapy, patient/family education, and DME and/or AE instructions     CONSULTED AND AGREED WITH PLAN OF CARE: Patient   PLAN FOR NEXT SESSION: P /ROM, isometric exercises, therapy ball exercises; progress to phase II of protocol.    Guadelupe Sabin, OTR/L  [phone]  01/20/2022, 2:00 PM

## 2022-01-24 ENCOUNTER — Encounter (HOSPITAL_COMMUNITY): Payer: Self-pay | Admitting: Occupational Therapy

## 2022-01-24 ENCOUNTER — Encounter (HOSPITAL_COMMUNITY): Payer: Medicare HMO | Admitting: Occupational Therapy

## 2022-01-24 ENCOUNTER — Ambulatory Visit (HOSPITAL_COMMUNITY): Payer: Medicare HMO | Attending: Internal Medicine | Admitting: Occupational Therapy

## 2022-01-24 DIAGNOSIS — M25611 Stiffness of right shoulder, not elsewhere classified: Secondary | ICD-10-CM

## 2022-01-24 DIAGNOSIS — M25511 Pain in right shoulder: Secondary | ICD-10-CM

## 2022-01-24 DIAGNOSIS — R29898 Other symptoms and signs involving the musculoskeletal system: Secondary | ICD-10-CM

## 2022-01-24 DIAGNOSIS — Z96611 Presence of right artificial shoulder joint: Secondary | ICD-10-CM | POA: Insufficient documentation

## 2022-01-24 DIAGNOSIS — Z471 Aftercare following joint replacement surgery: Secondary | ICD-10-CM | POA: Diagnosis not present

## 2022-01-24 NOTE — Patient Instructions (Signed)

## 2022-01-24 NOTE — Therapy (Signed)
OUTPATIENT OCCUPATIONAL THERAPY TREATMENT NOTE   Patient Name: Stephen Sullivan MRN: [id number] DOB:Jul 24, 1956, 66 y.o., male Today's Date: 01/24/2022  PCP: Dr. Sharlyne Cai  REFERRING PROVIDER: Dr. Larena Glassman   END OF SESSION:   OT End of Session - 01/24/22 1627     Visit Number 5    Number of Visits 16    Date for OT Re-Evaluation 03/12/22   mini-reassessment 02/08/2022   Authorization Type [employer]    Progress Note Due on Visit 10    OT Start Time 1344    OT Stop Time 1424    OT Time Calculation (min) 40 min    Activity Tolerance Patient tolerated treatment well    Behavior During Therapy WFL for tasks assessed/performed                Past Medical History:  Diagnosis Date   Arthritis    Back pain    Cataracts, bilateral    immature   GERD (gastroesophageal reflux disease)    using Baking Soda and Water per pt   Headache(784.0)    related to neck issues   History of bronchitis    History of gastric ulcer 20+yrs ago   Hypertension    takes Benazepril some days   Joint pain    Muscle spasm    takes Flexeril daily   Shortness of breath    with exertion   Weakness    and numbness in left fingers   Past Surgical History:  Procedure Laterality Date   ABDOMINAL AORTOGRAM W/LOWER EXTREMITY N/A 10/18/2020   Procedure: ABDOMINAL AORTOGRAM W/LOWER EXTREMITY;  Surgeon: Waynetta Sandy, MD;  Location: [place] [facility];  Service: Cardiovascular;  Laterality: N/A;   ANTERIOR CERVICAL DECOMP/DISCECTOMY FUSION N/A 12/18/2013   Procedure: ANTERIOR CERVICAL DECOMPRESSION/DISCECTOMY FUSION CERVICAL THREE-FOUR, FOUR-FIVE, FIVE-SIX W/ BONEGRAFT;  Surgeon: Hosie Spangle, MD;  Location: [place] [facility];  Service: Neurosurgery;  Laterality: N/A;  ANTERIOR CERVICAL DECOMPRESSION/DISCECTOMY FUSION CERVICAL THREE-FOUR, FOUR-FIVE, FIVE-SIX W/ BONEGRAFT   arm surgery Left    COLONOSCOPY WITH PROPOFOL N/A 11/15/2017   Procedure: COLONOSCOPY WITH PROPOFOL;   Surgeon: Daneil Dolin, MD;  [facility];  Service: Endoscopy;  Laterality: N/A;  12:30pm   ESOPHAGOGASTRODUODENOSCOPY (EGD) WITH PROPOFOL N/A 10/05/2016   Procedure: ESOPHAGOGASTRODUODENOSCOPY (EGD) WITH PROPOFOL;  Surgeon: Daneil Dolin, MD;  [facility];  Service: Endoscopy;  Laterality: N/A;   ESOPHAGOGASTRODUODENOSCOPY (EGD) WITH PROPOFOL N/A 02/15/2017   Procedure: ESOPHAGOGASTRODUODENOSCOPY (EGD) WITH PROPOFOL;  Surgeon: Daneil Dolin, MD;  [facility];  Service: Endoscopy;  Laterality: N/A;  [place] Right 12/22/2021   Procedure: HARDWARE REMOVAL;  Surgeon: Mordecai Rasmussen, MD;  Location: [facility];  Service: Orthopedics;  Laterality: Right;   INGUINAL HERNIA REPAIR Right 08/18/2016   Procedure: RIGHT INGUINAL HERNORRHAPHY WITH MESH;  Surgeon: Aviva Signs, MD;  Location: [facility];  Service: General;  Laterality: Right;   JOINT REPLACEMENT Right    hip    PERIPHERAL VASCULAR INTERVENTION Right 10/18/2020   Procedure: PERIPHERAL VASCULAR INTERVENTION;  Surgeon: Waynetta Sandy, MD;  Location: [place] [facility];  Service: Cardiovascular;  Laterality: Right;   POLYPECTOMY  11/15/2017   Procedure: POLYPECTOMY;  Surgeon: Daneil Dolin, MD;  [facility];  Service: Endoscopy;;  polyp cs splenic flexure times 2,  cecal polyp   REVERSE SHOULDER ARTHROPLASTY Right 12/22/2021   Procedure: REVERSE SHOULDER ARTHROPLASTY;  Surgeon: Mordecai Rasmussen, MD;  [facility]: [facility];  Service: Orthopedics;  Laterality: Right;   SHOULDER SURGERY Right    x 3    Patient Active Problem List   Diagnosis Date Noted   Glenohumeral arthritis, right 11/03/2021   GERD (gastroesophageal reflux disease) 10/15/2017   Constipation 01/09/2017   Gastrointestinal hemorrhage    Acute gastric ulcer    Acute esophagitis    GI bleed 10/04/2016   Polysubstance abuse ([place]) 10/04/2016   Tobacco dependence 10/04/2016   H/O arthrodesis 10/13/2014   Cervical  spondylosis 12/18/2013   Neck pain 11/25/2013   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 Pt had been experiencing right shoulder pain present since August 2022. Pt with a hx of right RCR approximately 40 year ago. Pt was referred to occupational therapy for evaluation and treatment by Dr. Larena Glassman.     PRECAUTIONS: shoulder, see protocol  SUBJECTIVE: S: Everything is real tight.   PAIN:  Are you having pain? Yes: NPRS scale: none/10 Pain location: right shoulder Pain description: burning Aggravating factors: using shoulder, movement  Relieving factors: ice, rest      OBJECTIVE:   FUNCTIONAL OUTCOME MEASURES: FOTO: 4/100   UPPER EXTREMITY ROM       Assessed supine, er/IR adducted Passive ROM Right eval  Shoulder flexion 80  Shoulder abduction 65  Shoulder internal rotation 90  Shoulder external rotation 30      UPPER EXTREMITY MMT:      Unable to assess due to pain/precautions MMT Right eval  Shoulder flexion    Shoulder abduction    Shoulder internal rotation    Shoulder external rotation    (Blank rows = not tested)   PATIENT EDUCATION: Education details: AA/ROM supine but keep table slides for abduction.  Person educated: Patient Education method: Explanation, Demonstration Education comprehension: verbalized understanding and returned demonstration     HOME EXERCISE PROGRAM: Eval: table slides, 6/20: AA/ROM supine but without abduction at this time.    TODAY'S TREATMENT: 01/24/22 - manual therapy: myofascial release  completed to upper arm, front shoulder, pectoralis, and trapezius  - P/ROM: shoulder flexion (50% ROM), abduction (50 to 75 % ROM), internal/external rotation, horizontal abduction and adduction 10x each direction.  -AA/ROM x10 reps supine: flexion, protraction, internal and external rotation, horizontal abduction. No abduction due to pain and difficulty.  -Therapy ball exercises: seated in chair rolling therapy boll out to side into shoulder abduction 10x. Holding for 2" at end Sullivan.   01/20/22 - manual therapy: myofascial release completed to upper arm, front shoulder, pectoralis, and trapezius  - P/ROM: shoulder flexion (50% ROM), abduction (50 to 75 % ROM), internal/external rotation, horizontal abduction and adduction 10x each direction.  - Isometric exercises: forwards flexion, shoulder adduction, and abduction holding each position for 5 seconds x5 reps each with therapist providing resistance with pt supine on mat.  -Therapy ball exercises: seated in chair rolling therapy boll forward into shoulder flexion 10x, rolling therapy ball out to side into shoulder abduction 10x   -Scapular A/ROM: row, shoulder elevation/depression, extension, 2x10 reps each movement  -Thumb Tacks, low level, 2x10 repetitions -  Table slides: flexion and abduction seated at table, flexion at incline surface, 1x10 repetitions   01/17/22 - manual therapy: myofascial release completed to upper arm, front shoulder, pectoralis, and trapezius  - P/ROM: shoulder flexion (50% ROM), abduction (50 to 75 % ROM), internal/external rotation, horizontal abduction and adduction 10x each direction.  - Isometric exercises: forwards flexion, shoulder adduction, and abduction holding each position for 5 seconds x3 reps each with therapist providing resistance with pt supine on mat.   GOALS: Goals reviewed with patient? Yes   SHORT TERM GOALS: Target date: 02/09/2022     Pt will be provided with and educated on HEP to improve  mobility required for use of RUE during ADLs.   Goal status: ONGOING   2.  Pt will increase RUE P/ROM to [facility] to improve ability to donn shirts with minimal compensatory strategies.    Goal status: ONGOING   3.   Pt will increase RUE strength to 3/5 or greater to improve ability to reach for items at waist to head height.   Goal status: ONGOING            LONG TERM GOALS: Target date: 03/09/2022     Pt will return to highest level of functioning using RUE as dominant during ADLs.    Goal status: ONGOING    2.  Pt will decrease pain in RUE to 3/10 or less to improve ability to sleep for 2+ hours without waking due to pain.    Goal status: ONGOING    3.  Pt will increase RUE A/ROM to [facility] to improve ability to reach overhead and behind back during dressing and bathing tasks.    Goal status: ONGOING    4.  Pt will increase RUE strength to 4+/5 or greater to improve ability to return to gym tasks and workouts.   Goal status: ONGOING    5.  Pt will decrease RUE fascial restrictions to min amounts or less to improve ability to perform functional reaching tasks during daily task completion.   Goal status: ONGOING       ASSESSMENT:   CLINICAL IMPRESSION: Pt tolerated myofascial release with less grimacing and guarding this date. P/ROM completed with pt still limited to ~50 to 75% of available Sullivan for flexion and abduction. Progressed pt to AA/ROM to continue to increase shoulder Sullivan of motion. Pt told to keep doing table slides for abduction due to pain and difficulty in brief supine attempt at AA/ROM. Graded to theraball stretches at end of session.      PLAN: OT FREQUENCY: 2x/week   OT DURATION: 8 weeks   PLANNED INTERVENTIONS: self care/ADL training, therapeutic exercise, therapeutic activity, manual therapy, passive Sullivan of motion, electrical stimulation, ultrasound, moist heat, cryotherapy, patient/family education, and DME and/or AE instructions     CONSULTED AND  AGREED WITH PLAN OF CARE: Patient   PLAN FOR NEXT SESSION: myofascial release, P /ROM, progressing to AA/ROM. Follow up on how pt did doing supine AA/ROM exercises at home for all motions except for abduction.     OT End of Session - 01/24/22 1627     Visit Number 5    Number of Visits 16    Date for OT Re-Evaluation 03/12/22   mini-reassessment 02/08/2022   Authorization Type [employer]    Progress Note Due on Visit 10    OT Start Time 1344    OT Stop Time 1424    OT Time Calculation (min) 40 min    Activity  Tolerance Patient tolerated treatment well    Behavior During Therapy [facility] for tasks assessed/performed              [place], MOT  [phone]  01/24/2022, 4:28 PM

## 2022-01-25 ENCOUNTER — Ambulatory Visit (HOSPITAL_COMMUNITY): Payer: Medicare HMO | Admitting: Occupational Therapy

## 2022-01-25 ENCOUNTER — Encounter (HOSPITAL_COMMUNITY): Payer: Self-pay | Admitting: Occupational Therapy

## 2022-01-25 DIAGNOSIS — M25511 Pain in right shoulder: Secondary | ICD-10-CM | POA: Diagnosis not present

## 2022-01-25 DIAGNOSIS — M25611 Stiffness of right shoulder, not elsewhere classified: Secondary | ICD-10-CM

## 2022-01-25 DIAGNOSIS — R29898 Other symptoms and signs involving the musculoskeletal system: Secondary | ICD-10-CM

## 2022-01-25 NOTE — Therapy (Signed)
OUTPATIENT OCCUPATIONAL THERAPY TREATMENT NOTE   Patient Name: Stephen Sullivan MRN: [id number] DOB:14-Sep-1955, 66 y.o., male Today's Date: 01/25/2022  PCP: Dr. Sharlyne Cai  REFERRING PROVIDER: Dr. Larena Glassman   END OF SESSION:   OT End of Session - 01/25/22 1302     Visit Number 6    Number of Visits 16    Date for OT Re-Evaluation 03/12/22   mini-reassessment 02/08/2022   Authorization Type [employer] Medicare    Progress Note Due on Visit 10    OT Start Time 1300    Activity Tolerance Patient tolerated treatment well    Behavior During Therapy [facility] for tasks assessed/performed                Past Medical History:  Diagnosis Date   Arthritis    Back pain    Cataracts, bilateral    immature   GERD (gastroesophageal reflux disease)    using Baking Soda and Water per pt   Headache(784.0)    related to neck issues   History of bronchitis    History of gastric ulcer 20+yrs ago   Hypertension    takes Benazepril some days   Joint pain    Muscle spasm    takes Flexeril daily   Shortness of breath    with exertion   Weakness    and numbness in left fingers   Past Surgical History:  Procedure Laterality Date   ABDOMINAL AORTOGRAM W/LOWER EXTREMITY N/A 10/18/2020   Procedure: ABDOMINAL AORTOGRAM W/LOWER EXTREMITY;  Surgeon: Waynetta Sandy, MD;  Location: [place] [facility];  Service: Cardiovascular;  Laterality: N/A;   ANTERIOR CERVICAL DECOMP/DISCECTOMY FUSION N/A 12/18/2013   Procedure: ANTERIOR CERVICAL DECOMPRESSION/DISCECTOMY FUSION CERVICAL THREE-FOUR, FOUR-FIVE, FIVE-SIX W/ BONEGRAFT;  Surgeon: Hosie Spangle, MD;  Location: [place] [facility];  Service: Neurosurgery;  Laterality: N/A;  ANTERIOR CERVICAL DECOMPRESSION/DISCECTOMY FUSION CERVICAL THREE-FOUR, FOUR-FIVE, FIVE-SIX W/ BONEGRAFT   arm surgery Left    COLONOSCOPY WITH PROPOFOL N/A 11/15/2017   Procedure: COLONOSCOPY WITH PROPOFOL;  Surgeon: Daneil Dolin, MD;  [facility];  Service:  Endoscopy;  Laterality: N/A;  12:30pm   ESOPHAGOGASTRODUODENOSCOPY (EGD) WITH PROPOFOL N/A 10/05/2016   Procedure: ESOPHAGOGASTRODUODENOSCOPY (EGD) WITH PROPOFOL;  Surgeon: Daneil Dolin, MD;  [facility];  Service: Endoscopy;  Laterality: N/A;   ESOPHAGOGASTRODUODENOSCOPY (EGD) WITH PROPOFOL N/A 02/15/2017   Procedure: ESOPHAGOGASTRODUODENOSCOPY (EGD) WITH PROPOFOL;  Surgeon: Daneil Dolin, MD;  [facility];  Service: Endoscopy;  Laterality: N/A;  [place] Right 12/22/2021   Procedure: HARDWARE REMOVAL;  Surgeon: Mordecai Rasmussen, MD;  Location[facility];  Service: Orthopedics;  Laterality: Right;   INGUINAL HERNIA REPAIR Right 08/18/2016   Procedure: RIGHT INGUINAL HERNORRHAPHY WITH MESH;  Surgeon: Aviva Signs, MD;  Location: [facility];  Service: General;  Laterality: Right;   JOINT REPLACEMENT Right    hip    PERIPHERAL VASCULAR INTERVENTION Right 10/18/2020   Procedure: PERIPHERAL VASCULAR INTERVENTION;  Surgeon: Waynetta Sandy, MD;  Location: [place] [facility];  Service: Cardiovascular;  Laterality: Right;   POLYPECTOMY  11/15/2017   Procedure: POLYPECTOMY;  Surgeon: Daneil Dolin, MD;  [facility];  Service: Endoscopy;;  polyp cs splenic flexure times 2,  cecal polyp   REVERSE SHOULDER ARTHROPLASTY Right 12/22/2021   Procedure: REVERSE SHOULDER ARTHROPLASTY;  Surgeon: Mordecai Rasmussen, MD;  Location: [facility];  Service: Orthopedics;  Laterality: Right;   SHOULDER SURGERY Right  x 3    Patient Active Problem List   Diagnosis Date Noted   Glenohumeral arthritis, right 11/03/2021   GERD (gastroesophageal reflux disease) 10/15/2017   Constipation 01/09/2017   Gastrointestinal hemorrhage    Acute gastric ulcer    Acute esophagitis    GI bleed 10/04/2016   Polysubstance abuse ([place]) 10/04/2016   Tobacco dependence 10/04/2016   H/O arthrodesis 10/13/2014   Cervical spondylosis 12/18/2013   Neck pain 11/25/2013   Spondylosis of  cervical spine at multiple levels without myelopathy 11/25/2013   Adjustment disorder 12/20/2011    ONSET DATE: 12/22/2021  REFERRING DIAG: s/p R reversed TSA  THERAPY DIAG:  No diagnosis found.   Rationale for Evaluation and Treatment Rehabilitation  PERTINENT HISTORY: Pt is a 66 y/o male s/p right reverse TSA on 12/22/21. Pt had been experiencing right shoulder pain present since August 2022. Pt with a hx of right RCR approximately 40 year ago. Pt was referred to occupational therapy for evaluation and treatment by Dr. Larena Glassman.     PRECAUTIONS: shoulder, see protocol  SUBJECTIVE: S: I am a little sore after yesterday. I am not sleeping with my sling anymore  PAIN:  Are you having pain? Yes: NPRS scale: 0/10 Pain location: right shoulder Pain description: burning Aggravating factors: using shoulder, movement  Relieving factors: ice, rest      OBJECTIVE:   FUNCTIONAL OUTCOME MEASURES: FOTO: 4/100   UPPER EXTREMITY ROM       Assessed supine, er/IR adducted Passive ROM Right eval  Shoulder flexion 80  Shoulder abduction 65  Shoulder internal rotation 90  Shoulder external rotation 30      UPPER EXTREMITY MMT:      Unable to assess due to pain/precautions MMT Right eval  Shoulder flexion    Shoulder abduction    Shoulder internal rotation    Shoulder external rotation    (Blank rows = not tested)   PATIENT EDUCATION: Education details: AA/ROM supine but keep table slides for abduction.  Person educated: Patient Education method: Explanation, Demonstration Education comprehension: verbalized understanding and returned demonstration     HOME EXERCISE PROGRAM: Eval: table slides, 6/20: AA/ROM supine but without abduction at this time.    TODAY'S TREATMENT:   01/25/22 - manual therapy: myofascial release completed to upper arm, front shoulder, pectoralis, and trapezius  - P/ROM: shoulder flexion (50% ROM), abduction (50 to 75 % ROM),  internal/external rotation, horizontal abduction and adduction 10x each direction.  -AA/ROM x10 reps supine: flexion, protraction, internal and external rotation, horizontal abduction, abduction -Isometrics: shoulder flexion, extension, abduction, 5x5" each direction -Pulleys: 3x10 shoulder flexion, about 120 degrees shoulder flexion   01/24/22 - manual therapy: myofascial release completed to upper arm, front shoulder, pectoralis, and trapezius  - P/ROM: shoulder flexion (50% ROM), abduction (50 to 75 % ROM), internal/external rotation, horizontal abduction and adduction 10x each direction.  -AA/ROM x10 reps supine: flexion, protraction, internal and external rotation, horizontal abduction. No abduction due to pain and difficulty.  -Therapy ball exercises: seated in chair rolling therapy boll out to side into shoulder abduction 10x. Holding for 2" at end range.    01/20/22 - manual therapy: myofascial release completed to upper arm, front shoulder, pectoralis, and trapezius  - P/ROM: shoulder flexion (50% ROM), abduction (50 to 75 % ROM), internal/external rotation, horizontal abduction and adduction 10x each direction.  - Isometric exercises: forwards flexion, shoulder adduction, and abduction holding each position for 5 seconds x5 reps each with therapist providing resistance with pt  supine on mat.  -Therapy ball exercises: seated in chair rolling therapy boll forward into shoulder flexion 10x, rolling therapy ball out to side into shoulder abduction 10x   -Scapular A/ROM: row, shoulder elevation/depression, extension, 2x10 reps each movement  -Thumb Tacks, low level, 2x10 repetitions -Table slides: flexion and abduction seated at table, flexion at incline surface, 1x10 repetitions   GOALS: Goals reviewed with patient? Yes   SHORT TERM GOALS: Target date: 02/09/2022     Pt will be provided with and educated on HEP to improve mobility required for use of RUE during ADLs.   Goal status:  ONGOING   2.  Pt will increase RUE P/ROM to [facility] to improve ability to donn shirts with minimal compensatory strategies.    Goal status: ONGOING   3.   Pt will increase RUE strength to 3/5 or greater to improve ability to reach for items at waist to head height.   Goal status: ONGOING            LONG TERM GOALS: Target date: 03/09/2022     Pt will return to highest level of functioning using RUE as dominant during ADLs.    Goal status: ONGOING    2.  Pt will decrease pain in RUE to 3/10 or less to improve ability to sleep for 2+ hours without waking due to pain.    Goal status: ONGOING    3.  Pt will increase RUE A/ROM to [facility] to improve ability to reach overhead and behind back during dressing and bathing tasks.    Goal status: ONGOING    4.  Pt will increase RUE strength to 4+/5 or greater to improve ability to return to gym tasks and workouts.   Goal status: ONGOING    5.  Pt will decrease RUE fascial restrictions to min amounts or less to improve ability to perform functional reaching tasks during daily task completion.   Goal status: ONGOING       ASSESSMENT:   CLINICAL IMPRESSION: Completed myofascial release to decrease muscle tightness and pain prior to exercises. Continues with P/ROM limited to ~50 to 75% of available range, facial grimacing and guarding with pt reporting to increased pain but a "tightness". He was able to tolerate AA/ROM abduction today with no significant increase in pain reported. Therapist providing initial demonstration and diminishing tactile cues for form. Introduced pulleys today with pt able to achieve about 120 degrees of AA/ROM. Therapist providing mod to max tactile cues for form, block at shoulder to prevent hiking and compensation.      PLAN: OT FREQUENCY: 2x/week   OT DURATION: 8 weeks   PLANNED INTERVENTIONS: self care/ADL training, therapeutic exercise, therapeutic activity, manual therapy, passive range of motion, electrical  stimulation, ultrasound, moist heat, cryotherapy, patient/family education, and DME and/or AE instructions     CONSULTED AND AGREED WITH PLAN OF CARE: Patient   PLAN FOR NEXT SESSION: Continue with myofascial release and AA/ROM. Scapular ROM and strengthening    Guadelupe Sabin, OTR/L  [phone]  01/25/2022, 1:02 PM

## 2022-01-27 ENCOUNTER — Ambulatory Visit (INDEPENDENT_AMBULATORY_CARE_PROVIDER_SITE_OTHER): Payer: Medicare HMO

## 2022-01-27 ENCOUNTER — Ambulatory Visit (INDEPENDENT_AMBULATORY_CARE_PROVIDER_SITE_OTHER): Payer: Medicare HMO | Admitting: Orthopedic Surgery

## 2022-01-27 ENCOUNTER — Encounter: Payer: Self-pay | Admitting: Orthopedic Surgery

## 2022-01-27 DIAGNOSIS — M5442 Lumbago with sciatica, left side: Secondary | ICD-10-CM

## 2022-01-27 DIAGNOSIS — Z96611 Presence of right artificial shoulder joint: Secondary | ICD-10-CM

## 2022-01-27 MED ORDER — PREDNISONE 10 MG (21) PO TBPK: ORAL_TABLET | ORAL | 0 refills | Status: DC

## 2022-01-27 NOTE — Progress Notes (Signed)
Orthopaedic Postop Note  Assessment: Stephen Sullivan is a 66 y.o. male s/p Right Reverse Shoulder Arthroplasty  DOS: 12/22/2021  Plan: Stephen Sullivan is doing well following surgery.  His pain is improving.  He has been working with physical therapy.  His range of motion is improving.  He feels well overall.  He is no longer using the sling.  Encouraged him to continue working on his physical therapy.  Reminded him that this is a several month recovery.  He states his understanding.  Regarding the pain he is having in the left leg, he was unable to tolerate gabapentin.  We will try prednisone Dosepak.  If he continues to have issues, he may require further work-up, including updated x-rays on his lower back.  Follow up 6 weeks   Follow-up: Return in about 6 weeks (around 03/10/2022).  XR at next visit: Left shoulder  Subjective:  Chief Complaint  Patient presents with   Routine Post Op    RT DOS 12/22/21//s/p reverse total shoulder replacement    History of Present Illness: Stephen Sullivan is a 66 y.o. male who presents following the above stated procedure.  Surgery was 5 weeks ago.  He is doing well.  He has less pain in the right shoulder.  He has been working well with therapy.  They are focusing on range of motion.  He does continue to have pain in the left leg.  This is been ongoing since surgery.  He tried some gabapentin, but he had some side effects.  He has now stopped taking gabapentin.  Occasionally, the pain will get severe.  It is intermittent.  It does radiate down the left leg.  Review of Systems: No fevers or chills No numbness or tingling No Chest Pain No shortness of breath   Objective: There were no vitals taken for this visit.  Physical Exam:  Alert and oriented.  No acute distress.  Surgical incision is healing well.  No surrounding erythema or drainage.  No tenderness to palpation along the incision.  He tolerates passive forward flexion to 130 degrees.   Abduction to 90 degrees.  Sensation is intact in the axillary nerve distribution.  Sensation is intact throughout the right hand.  2+ radial pulse.   Negative straight leg raise bilaterally.  IMAGING: I personally ordered and reviewed the following images:  X-ray of the right shoulder was obtained in clinic today.  No acute injuries are noted.  Well-positioned prosthesis.  No evidence of subsidence.  The glenohumeral joint is reduced.  No fractures are appreciated.  Impression: Right reverse shoulder arthroplasty in stable position.  Oliver Barre, MD 01/27/2022 12:14 PM

## 2022-01-31 ENCOUNTER — Ambulatory Visit (HOSPITAL_COMMUNITY): Payer: Medicare HMO | Admitting: Occupational Therapy

## 2022-01-31 ENCOUNTER — Encounter (HOSPITAL_COMMUNITY): Payer: Self-pay | Admitting: Occupational Therapy

## 2022-01-31 DIAGNOSIS — M25511 Pain in right shoulder: Secondary | ICD-10-CM

## 2022-01-31 DIAGNOSIS — R29898 Other symptoms and signs involving the musculoskeletal system: Secondary | ICD-10-CM

## 2022-01-31 DIAGNOSIS — M25611 Stiffness of right shoulder, not elsewhere classified: Secondary | ICD-10-CM

## 2022-01-31 NOTE — Therapy (Signed)
OUTPATIENT OCCUPATIONAL THERAPY TREATMENT NOTE   Patient Name: Stephen Sullivan MRN: [id number] DOB:12-17-1955, 66 y.o., male Today's Date: 01/31/2022  PCP: Dr. Gilman Schmidt  REFERRING PROVIDER: Dr. Thane Edu   END OF SESSION:   OT End of Session - 01/31/22 1608     Visit Number 7    Number of Visits 16    Date for OT Re-Evaluation 03/12/22   mini-reassessment 02/08/2022   Authorization Type [employer]    Progress Note Due on Visit 10    OT Start Time 1451    OT Stop Time 1532    OT Time Calculation (min) 41 min    Activity Tolerance Patient tolerated treatment well    Behavior During Therapy WFL for tasks assessed/performed                 Past Medical History:  Diagnosis Date   Arthritis    Back pain    Cataracts, bilateral    immature   GERD (gastroesophageal reflux disease)    using Baking Soda and Water per pt   Headache(784.0)    related to neck issues   History of bronchitis    History of gastric ulcer 20+yrs ago   Hypertension    takes Benazepril some days   Joint pain    Muscle spasm    takes Flexeril daily   Shortness of breath    with exertion   Weakness    and numbness in left fingers   Past Surgical History:  Procedure Laterality Date   ABDOMINAL AORTOGRAM W/LOWER EXTREMITY N/A 10/18/2020   Procedure: ABDOMINAL AORTOGRAM W/LOWER EXTREMITY;  Surgeon: Maeola Harman, MD;  Location: [facility] INVASIVE CV LAB;  Service: Cardiovascular;  Laterality: N/A;   ANTERIOR CERVICAL DECOMP/DISCECTOMY FUSION N/A 12/18/2013   Procedure: ANTERIOR CERVICAL DECOMPRESSION/DISCECTOMY FUSION CERVICAL THREE-FOUR, FOUR-FIVE, FIVE-SIX W/ BONEGRAFT;  Surgeon: Hewitt Shorts, MD;  Location: [facility];  Service: Neurosurgery;  Laterality: N/A;  ANTERIOR CERVICAL DECOMPRESSION/DISCECTOMY FUSION CERVICAL THREE-FOUR, FOUR-FIVE, FIVE-SIX W/ BONEGRAFT   arm surgery Left    COLONOSCOPY WITH PROPOFOL N/A 11/15/2017   Procedure: COLONOSCOPY WITH PROPOFOL;   Surgeon: Corbin Ade, MD;  Location: [facility];  Service: Endoscopy;  Laterality: N/A;  12:30pm   ESOPHAGOGASTRODUODENOSCOPY (EGD) WITH PROPOFOL N/A 10/05/2016   Procedure: ESOPHAGOGASTRODUODENOSCOPY (EGD) WITH PROPOFOL;  Surgeon: Corbin Ade, MD;  Location: [facility];  Service: Endoscopy;  Laterality: N/A;   ESOPHAGOGASTRODUODENOSCOPY (EGD) WITH PROPOFOL N/A 02/15/2017   Procedure: ESOPHAGOGASTRODUODENOSCOPY (EGD) WITH PROPOFOL;  Surgeon: Corbin Ade, MD;  Location: [facility];  Service: Endoscopy;  Laterality: N/A;  1115   HARDWARE REMOVAL Right 12/22/2021   Procedure: HARDWARE REMOVAL;  Surgeon: Oliver Barre, MD;  Location: [facility];  Service: Orthopedics;  Laterality: Right;   INGUINAL HERNIA REPAIR Right 08/18/2016   Procedure: RIGHT INGUINAL HERNORRHAPHY WITH MESH;  Surgeon: Franky Macho, MD;  Location: [facility];  Service: General;  Laterality: Right;   JOINT REPLACEMENT Right    hip    PERIPHERAL VASCULAR INTERVENTION Right 10/18/2020   Procedure: PERIPHERAL VASCULAR INTERVENTION;  Surgeon: Maeola Harman, MD;  Location: [facility] INVASIVE CV LAB;  Service: Cardiovascular;  Laterality: Right;   POLYPECTOMY  11/15/2017   Procedure: POLYPECTOMY;  Surgeon: Corbin Ade, MD;  Location: [facility];  Service: Endoscopy;;  polyp cs splenic flexure times 2,  cecal polyp   REVERSE SHOULDER ARTHROPLASTY Right 12/22/2021   Procedure: REVERSE SHOULDER ARTHROPLASTY;  Surgeon: Oliver Barre, MD;  Location: [facility];  Service: Orthopedics;  Laterality: Right;   SHOULDER SURGERY Right    x 3    Patient Active Problem List   Diagnosis Date Noted   Glenohumeral arthritis, right 11/03/2021   GERD (gastroesophageal reflux disease) 10/15/2017   Constipation 01/09/2017   Gastrointestinal hemorrhage    Acute gastric ulcer    Acute esophagitis    GI bleed 10/04/2016   Polysubstance abuse (HCC) 10/04/2016   Tobacco dependence 10/04/2016   H/O arthrodesis 10/13/2014   Cervical  spondylosis 12/18/2013   Neck pain 11/25/2013   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 Pt had been experiencing right shoulder pain present since August 2022. Pt with a hx of right RCR approximately 40 year ago. Pt was referred to occupational therapy for evaluation and treatment by Dr. Thane Edu.     PRECAUTIONS: shoulder, see protocol  SUBJECTIVE: S: The doctor felt like I was doing good.   PAIN:  Are you having pain? Yes: NPRS scale: 0/10 Pain location: right shoulder Pain description: burning Aggravating factors: using shoulder, movement  Relieving factors: ice, rest      OBJECTIVE:   FUNCTIONAL OUTCOME MEASURES: FOTO: 4/100   UPPER EXTREMITY ROM       Assessed supine, er/IR adducted Passive ROM Right eval  Shoulder flexion 80  Shoulder abduction 65  Shoulder internal rotation 90  Shoulder external rotation 30      UPPER EXTREMITY MMT:      Unable to assess due to pain/precautions MMT Right eval  Shoulder flexion    Shoulder abduction    Shoulder internal rotation    Shoulder external rotation    (Blank rows = not tested)   PATIENT EDUCATION: Education details: Educated on tucking pillow under R side to keep from sleeping on R side.  Person educated: Patient Education method: Explanation,  Education comprehension: verbalized understanding     HOME EXERCISE PROGRAM: Eval: table slides, 6/20: AA/ROM supine but without abduction at this time.    TODAY'S TREATMENT:  01/31/22 - manual therapy: myofascial release  completed to upper arm, front shoulder, pectoralis, and trapezius  - P/ROM: shoulder flexion (75% ROM), abduction (>75%ROM), internal/external rotation, horizontal abduction and adduction 10x each direction.  -AA/ROM x10 reps supine: flexion, protraction, internal and external rotation, horizontal abduction, abduction -Isometrics: shoulder flexion, extension, abduction, 5x5" each direction -Pulleys: 1 minute shoulder flexion; 1 minute abduction -seated protraction, retraction, elevation, and depression x10 reps each -wall wash flexion 1 minute standing  - thumb tacks at shoulder level x10 reps seated  -scapular strengthening standing: retraction and extension with red band x10 reps    01/25/22 - manual therapy: myofascial release completed to upper arm, front shoulder, pectoralis, and trapezius  - P/ROM: shoulder flexion (50% ROM), abduction (50 to 75 % ROM), internal/external rotation, horizontal abduction and adduction 10x each direction.  -AA/ROM x10 reps supine: flexion, protraction, internal and external rotation, horizontal abduction, abduction -Isometrics supine: shoulder flexion, extension, abduction, 5x5" each direction -Pulleys: 3x10 shoulder flexion, about 120 degrees shoulder flexion     01/24/22 - manual therapy: myofascial release completed  to upper arm, front shoulder, pectoralis, and trapezius  - P/ROM: shoulder flexion (50% ROM), abduction (50 to 75 % ROM), internal/external rotation, horizontal abduction and adduction 10x each direction.  -AA/ROM x10 reps supine: flexion, protraction, internal and external rotation, horizontal abduction. No abduction due to pain and difficulty.  -Therapy ball exercises: seated in chair rolling therapy boll out to side into shoulder abduction 10x. Holding for 2" at end range.     GOALS: Goals reviewed with patient? Yes   SHORT TERM GOALS: Target date: 02/09/2022     Pt will be provided with and educated on HEP to improve mobility required  for use of RUE during ADLs.   Goal status: ONGOING   2.  Pt will increase RUE P/ROM to [facility] to improve ability to donn shirts with minimal compensatory strategies.    Goal status: ONGOING   3.   Pt will increase RUE strength to 3/5 or greater to improve ability to reach for items at waist to head height.   Goal status: ONGOING            LONG TERM GOALS: Target date: 03/09/2022     Pt will return to highest level of functioning using RUE as dominant during ADLs.    Goal status: ONGOING    2.  Pt will decrease pain in RUE to 3/10 or less to improve ability to sleep for 2+ hours without waking due to pain.    Goal status: ONGOING    3.  Pt will increase RUE A/ROM to [facility] to improve ability to reach overhead and behind back during dressing and bathing tasks.    Goal status: ONGOING    4.  Pt will increase RUE strength to 4+/5 or greater to improve ability to return to gym tasks and workouts.   Goal status: ONGOING    5.  Pt will decrease RUE fascial restrictions to min amounts or less to improve ability to perform functional reaching tasks during daily task completion.   Goal status: ONGOING       ASSESSMENT:   CLINICAL IMPRESSION: Completed myofascial release to decrease muscle tightness and pain prior to exercises. PROM demonstrated more near 75% of available range for abduction and flexion today. Pt still limited to ~50% of available range for AA/ROM for abduction and 75% or more for flexion. Pt progressed to scapular strengthening with red band for retraction and extension as well as wall wash in flexion only. Pt reported that his referring doctor was pleased with his progress at his recent visit.    PLAN: OT FREQUENCY: 2x/week   OT DURATION: 8 weeks   PLANNED INTERVENTIONS: self care/ADL training, therapeutic exercise, therapeutic activity, manual therapy, passive range of motion, electrical stimulation, ultrasound, moist heat, cryotherapy, patient/family education,  and DME and/or AE instructions     CONSULTED AND AGREED WITH PLAN OF CARE: Patient   PLAN FOR NEXT SESSION: Continue with myofascial release and AA/ROM. Scapular ROM and strengthening. Continue scapular strengthening with red bands. Add PVC pipe slide specifically for abduction.   [address] OT, [place]  [phone]  01/31/2022, 4:10 PM

## 2022-02-02 ENCOUNTER — Ambulatory Visit (HOSPITAL_COMMUNITY): Payer: Medicare HMO

## 2022-02-02 ENCOUNTER — Encounter (HOSPITAL_COMMUNITY): Payer: Self-pay

## 2022-02-02 DIAGNOSIS — M25611 Stiffness of right shoulder, not elsewhere classified: Secondary | ICD-10-CM

## 2022-02-02 DIAGNOSIS — R29898 Other symptoms and signs involving the musculoskeletal system: Secondary | ICD-10-CM

## 2022-02-02 DIAGNOSIS — M25511 Pain in right shoulder: Secondary | ICD-10-CM | POA: Diagnosis not present

## 2022-02-02 NOTE — Therapy (Signed)
OUTPATIENT OCCUPATIONAL THERAPY TREATMENT NOTE   Patient Name: Stephen Sullivan MRN: [id number] DOB:May 27, 1956, 66 y.o., male Today's Date: 02/02/2022  PCP: Dr. Sharlyne Cai  REFERRING PROVIDER: Dr. Larena Glassman   END OF SESSION:   OT End of Session - 02/02/22 0935     Visit Number 8    Number of Visits 16    Date for OT Re-Evaluation 03/12/22   mini-reassessment 02/08/2022   Authorization Type [employer]    Progress Note Due on Visit 10    OT Start Time 0933    OT Stop Time 1015    OT Time Calculation (min) 42 min    Activity Tolerance Patient tolerated treatment well    Behavior During Therapy WFL for tasks assessed/performed                 Past Medical History:  Diagnosis Date   Arthritis    Back pain    Cataracts, bilateral    immature   GERD (gastroesophageal reflux disease)    using Baking Soda and Water per pt   Headache(784.0)    related to neck issues   History of bronchitis    History of gastric ulcer 20+yrs ago   Hypertension    takes Benazepril some days   Joint pain    Muscle spasm    takes Flexeril daily   Shortness of breath    with exertion   Weakness    and numbness in left fingers   Past Surgical History:  Procedure Laterality Date   ABDOMINAL AORTOGRAM W/LOWER EXTREMITY N/A 10/18/2020   Procedure: ABDOMINAL AORTOGRAM W/LOWER EXTREMITY;  Surgeon: Waynetta Sandy, MD;  Location: [place] CV LAB;  Service: Cardiovascular;  Laterality: N/A;   ANTERIOR CERVICAL DECOMP/DISCECTOMY FUSION N/A 12/18/2013   Procedure: ANTERIOR CERVICAL DECOMPRESSION/DISCECTOMY FUSION CERVICAL THREE-FOUR, FOUR-FIVE, FIVE-SIX W/ BONEGRAFT;  Surgeon: Hosie Spangle, MD;  Location: [place] [facility];  Service: Neurosurgery;  Laterality: N/A;  ANTERIOR CERVICAL DECOMPRESSION/DISCECTOMY FUSION CERVICAL THREE-FOUR, FOUR-FIVE, FIVE-SIX W/ BONEGRAFT   arm surgery Left    COLONOSCOPY WITH PROPOFOL N/A 11/15/2017   Procedure: COLONOSCOPY WITH PROPOFOL;   Surgeon: Daneil Dolin, MD;  [facility]: [facility];  Service: Endoscopy;  Laterality: N/A;  12:30pm   ESOPHAGOGASTRODUODENOSCOPY (EGD) WITH PROPOFOL N/A 10/05/2016   Procedure: ESOPHAGOGASTRODUODENOSCOPY (EGD) WITH PROPOFOL;  Surgeon: Daneil Dolin, MD;  [facility]: [facility];  Service: Endoscopy;  Laterality: N/A;   ESOPHAGOGASTRODUODENOSCOPY (EGD) WITH PROPOFOL N/A 02/15/2017   Procedure: ESOPHAGOGASTRODUODENOSCOPY (EGD) WITH PROPOFOL;  Surgeon: Daneil Dolin, MD;  Location: [facility];  Service: Endoscopy;  Laterality: N/A;  [place] Right 12/22/2021   Procedure: HARDWARE REMOVAL;  Surgeon: Mordecai Rasmussen, MD;  Location: [facility];  Service: Orthopedics;  Laterality: Right;   INGUINAL HERNIA REPAIR Right 08/18/2016   Procedure: RIGHT INGUINAL HERNORRHAPHY WITH MESH;  Surgeon: Aviva Signs, MD;  Location: [facility];  Service: General;  Laterality: Right;   JOINT REPLACEMENT Right    hip    PERIPHERAL VASCULAR INTERVENTION Right 10/18/2020   Procedure: PERIPHERAL VASCULAR INTERVENTION;  Surgeon: Waynetta Sandy, MD;  Location: [place] CV LAB;  Service: Cardiovascular;  Laterality: Right;   POLYPECTOMY  11/15/2017   Procedure: POLYPECTOMY;  Surgeon: Daneil Dolin, MD;  [facility];  Service: Endoscopy;;  polyp cs splenic flexure times 2,  cecal polyp   REVERSE SHOULDER ARTHROPLASTY Right 12/22/2021   Procedure: REVERSE SHOULDER ARTHROPLASTY;  Surgeon: Mordecai Rasmussen, MD;  Location: [facility];  Service: Orthopedics;  Laterality: Right;   SHOULDER SURGERY Right    x 3    Patient Active Problem List   Diagnosis Date Noted   Glenohumeral arthritis, right 11/03/2021   GERD (gastroesophageal reflux disease) 10/15/2017   Constipation 01/09/2017   Gastrointestinal hemorrhage    Acute gastric ulcer    Acute esophagitis    GI bleed 10/04/2016   Polysubstance abuse ([place]) 10/04/2016   Tobacco dependence 10/04/2016   H/O arthrodesis 10/13/2014   Cervical  spondylosis 12/18/2013   Neck pain 11/25/2013   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 Pt had been experiencing right shoulder pain present since August 2022. Pt with a hx of right RCR approximately 40 year ago. Pt was referred to occupational therapy for evaluation and treatment by Dr. Larena Glassman.     PRECAUTIONS: shoulder, see protocol  SUBJECTIVE: S: I feel good, but I want it to get better yet. This leg has been bothering me more than anything.   PAIN:  Are you having pain? Yes: NPRS scale: 0/10 Pain location: right shoulder Pain description: burning Aggravating factors: using shoulder, movement  Relieving factors: ice, rest      OBJECTIVE:   FUNCTIONAL OUTCOME MEASURES: FOTO: 4/100   UPPER EXTREMITY ROM       Assessed supine, er/IR adducted Passive ROM Right eval  Shoulder flexion 80  Shoulder abduction 65  Shoulder internal rotation 90  Shoulder external rotation 30      UPPER EXTREMITY MMT:      Unable to assess due to pain/precautions MMT Right eval  Shoulder flexion    Shoulder abduction    Shoulder internal rotation    Shoulder external rotation    (Blank rows = not tested)   PATIENT EDUCATION: Education details: Sports administrator  Person educated: Patient Education method: Explanation, handout, demonstration  Education comprehension: verbalized understanding, returned demonstration      HOME EXERCISE PROGRAM: Eval: table slides, 6/20: AA/ROM supine but without abduction at this time.  02/02/22: Shoulder  isometrics    TODAY'S TREATMENT: 02/02/22 -Manual therapy: myofascial release completed to upper arm, front shoulder, pectoralis, and trapezius  -P/ROM: shoulder flexion, abduction, IR/er -A/ROM: seated shoulder flexion and abduction, 1x10 reps each direction  -Isometrics: shoulder flexion, extension, abduction, 5x5" each direction -Thumb tacks at shoulder level x10 reps seated -Wall wash flexion 1 min standing  -Pulleys: 1 minute shoulder flexion; 1 minute abduction    01/31/22 - manual therapy: myofascial release completed to upper arm, front shoulder, pectoralis, and trapezius  - P/ROM: shoulder flexion (75% ROM), abduction (>75%ROM), internal/external rotation, horizontal abduction and adduction 10x each direction.  -AA/ROM x10 reps supine: flexion, protraction, internal and external rotation, horizontal abduction, abduction -Isometrics: shoulder flexion, extension, abduction, 5x5" each direction -Pulleys: 1 minute shoulder flexion; 1 minute abduction -seated protraction, retraction, elevation, and depression x10 reps each -wall wash flexion 1 minute standing  - thumb tacks at shoulder level x10 reps seated  -scapular strengthening standing: retraction and extension with red band x10 reps    01/25/22 - manual therapy: myofascial release completed  to upper arm, front shoulder, pectoralis, and trapezius  - P/ROM: shoulder flexion (50% ROM), abduction (50 to 75 % ROM), internal/external rotation, horizontal abduction and adduction 10x each direction.  -AA/ROM x10 reps supine: flexion, protraction, internal and external rotation, horizontal abduction, abduction -Isometrics supine: shoulder flexion, extension, abduction, 5x5" each direction -Pulleys: 3x10 shoulder flexion, about 120 degrees shoulder flexion    GOALS: Goals reviewed with patient? Yes   SHORT TERM GOALS: Target date: 02/09/2022     Pt will be provided with and educated on HEP to improve mobility required for use of RUE  during ADLs.   Goal status: ONGOING   2.  Pt will increase RUE P/ROM to [facility] to improve ability to donn shirts with minimal compensatory strategies.    Goal status: ONGOING   3.   Pt will increase RUE strength to 3/5 or greater to improve ability to reach for items at waist to head height.   Goal status: ONGOING            LONG TERM GOALS: Target date: 03/09/2022     Pt will return to highest level of functioning using RUE as dominant during ADLs.    Goal status: ONGOING    2.  Pt will decrease pain in RUE to 3/10 or less to improve ability to sleep for 2+ hours without waking due to pain.    Goal status: ONGOING    3.  Pt will increase RUE A/ROM to [facility] to improve ability to reach overhead and behind back during dressing and bathing tasks.    Goal status: ONGOING    4.  Pt will increase RUE strength to 4+/5 or greater to improve ability to return to gym tasks and workouts.   Goal status: ONGOING    5.  Pt will decrease RUE fascial restrictions to min amounts or less to improve ability to perform functional reaching tasks during daily task completion.   Goal status: ONGOING       ASSESSMENT:   CLINICAL IMPRESSION: Continued with manual therapy at start of session to address remaining myofascial restrictions, emphasis on bicep with pt continuing with significant tightness. Therapist cuing for deep breathing with P/ROM resulting in pt able to achieve greater ROM. Per protocol, introduced A/ROM today with pt responding well, able to achieve about 90 degrees shoulder flexion and abduction. He reported no increased pain with any movement or exercise today, just a slight "discomfort and pulling."     PLAN: OT FREQUENCY: 2x/week   OT DURATION: 8 weeks   PLANNED INTERVENTIONS: self care/ADL training, therapeutic exercise, therapeutic activity, manual therapy, passive range of motion, electrical stimulation, ultrasound, moist heat, cryotherapy, patient/family education, and DME  and/or AE instructions     CONSULTED AND AGREED WITH PLAN OF CARE: Patient   PLAN FOR NEXT SESSION: Continue with myofascial release and AA/ROM. Scapular ROM and strengthening. Continue scapular strengthening with red bands. Add PVC pipe slide specifically for abduction. Continue with A/ROM to pt tolerance    Flonnie Hailstone, [place], OTR/L [phone]  02/02/2022, 10:24 AM

## 2022-02-02 NOTE — Patient Instructions (Signed)
  Complete the following 2 a day. Hold for 5 seconds. Complete 5 sets for each.   1) SHOULDER - ISOMETRIC FLEXION  Gently push your fist forward into a wall with your elbow bent.    2) SHOULDER - ISOMETRIC EXTENSION  Gently push your a bent elbow back into a wall.    3) SHOULDER - ISOMETRIC INTERNAL ROTATION   Gently press your hand into a wall using the palm side of your hand.  Maintain a bent elbow the entire time.

## 2022-02-08 ENCOUNTER — Encounter (HOSPITAL_COMMUNITY): Payer: Self-pay

## 2022-02-08 ENCOUNTER — Ambulatory Visit (HOSPITAL_COMMUNITY): Payer: Medicare HMO | Attending: Orthopedic Surgery

## 2022-02-08 DIAGNOSIS — M25511 Pain in right shoulder: Secondary | ICD-10-CM | POA: Diagnosis not present

## 2022-02-08 DIAGNOSIS — R29898 Other symptoms and signs involving the musculoskeletal system: Secondary | ICD-10-CM | POA: Diagnosis present

## 2022-02-08 DIAGNOSIS — M25611 Stiffness of right shoulder, not elsewhere classified: Secondary | ICD-10-CM | POA: Diagnosis present

## 2022-02-08 NOTE — Patient Instructions (Signed)

## 2022-02-08 NOTE — Therapy (Signed)
OUTPATIENT OCCUPATIONAL THERAPY TREATMENT NOTE   Patient Name: Stephen Sullivan MRN: [id number] DOB:Sep 12, 1955, 66 y.o., male Today's Date: 02/08/2022  PCP: Dr. Sharlyne Cai  REFERRING PROVIDER: Dr. Larena Glassman   END OF SESSION:   OT End of Session - 02/08/22 1258     Visit Number 9    Number of Visits 16    Date for OT Re-Evaluation 03/12/22   mini-reassessment 02/08/2022   Authorization Type [employer]    Progress Note Due on Visit 10    OT Start Time 1300    OT Stop Time 1340    OT Time Calculation (min) 40 min    Activity Tolerance Patient tolerated treatment well    Behavior During Therapy WFL for tasks assessed/performed                 Past Medical History:  Diagnosis Date   Arthritis    Back pain    Cataracts, bilateral    immature   GERD (gastroesophageal reflux disease)    using Baking Soda and Water per pt   Headache(784.0)    related to neck issues   History of bronchitis    History of gastric ulcer 20+yrs ago   Hypertension    takes Benazepril some days   Joint pain    Muscle spasm    takes Flexeril daily   Shortness of breath    with exertion   Weakness    and numbness in left fingers   Past Surgical History:  Procedure Laterality Date   ABDOMINAL AORTOGRAM W/LOWER EXTREMITY N/A 10/18/2020   Procedure: ABDOMINAL AORTOGRAM W/LOWER EXTREMITY;  Surgeon: Waynetta Sandy, MD;  Location: [place] [facility];  Service: Cardiovascular;  Laterality: N/A;   ANTERIOR CERVICAL DECOMP/DISCECTOMY FUSION N/A 12/18/2013   Procedure: ANTERIOR CERVICAL DECOMPRESSION/DISCECTOMY FUSION CERVICAL THREE-FOUR, FOUR-FIVE, FIVE-SIX W/ BONEGRAFT;  Surgeon: Hosie Spangle, MD;  Location: [place] [facility];  Service: Neurosurgery;  Laterality: N/A;  ANTERIOR CERVICAL DECOMPRESSION/DISCECTOMY FUSION CERVICAL THREE-FOUR, FOUR-FIVE, FIVE-SIX W/ BONEGRAFT   arm surgery Left    COLONOSCOPY WITH PROPOFOL N/A 11/15/2017   Procedure: COLONOSCOPY WITH PROPOFOL;   Surgeon: Daneil Dolin, MD;  [facility];  Service: Endoscopy;  Laterality: N/A;  12:30pm   ESOPHAGOGASTRODUODENOSCOPY (EGD) WITH PROPOFOL N/A 10/05/2016   Procedure: ESOPHAGOGASTRODUODENOSCOPY (EGD) WITH PROPOFOL;  Surgeon: Daneil Dolin, MD;  [facility];  Service: Endoscopy;  Laterality: N/A;   ESOPHAGOGASTRODUODENOSCOPY (EGD) WITH PROPOFOL N/A 02/15/2017   Procedure: ESOPHAGOGASTRODUODENOSCOPY (EGD) WITH PROPOFOL;  Surgeon: Daneil Dolin, MD;  [facility];  Service: Endoscopy;  Laterality: N/A;  [place] Right 12/22/2021   Procedure: HARDWARE REMOVAL;  Surgeon: Mordecai Rasmussen, MD;  Location: [facility];  Service: Orthopedics;  Laterality: Right;   INGUINAL HERNIA REPAIR Right 08/18/2016   Procedure: RIGHT INGUINAL HERNORRHAPHY WITH MESH;  Surgeon: Aviva Signs, MD;  Location: [facility];  Service: General;  Laterality: Right;   JOINT REPLACEMENT Right    hip    PERIPHERAL VASCULAR INTERVENTION Right 10/18/2020   Procedure: PERIPHERAL VASCULAR INTERVENTION;  Surgeon: Waynetta Sandy, MD;  Location: [place] [facility];  Service: Cardiovascular;  Laterality: Right;   POLYPECTOMY  11/15/2017   Procedure: POLYPECTOMY;  Surgeon: Daneil Dolin, MD;  [facility];  Service: Endoscopy;;  polyp cs splenic flexure times 2,  cecal polyp   REVERSE SHOULDER ARTHROPLASTY Right 12/22/2021   Procedure: REVERSE SHOULDER ARTHROPLASTY;  Surgeon: Mordecai Rasmussen, MD;  [facility]: [facility];  Service: Orthopedics;  Laterality: Right;   SHOULDER SURGERY Right    x 3    Patient Active Problem List   Diagnosis Date Noted   Glenohumeral arthritis, right 11/03/2021   GERD (gastroesophageal reflux disease) 10/15/2017   Constipation 01/09/2017   Gastrointestinal hemorrhage    Acute gastric ulcer    Acute esophagitis    GI bleed 10/04/2016   Polysubstance abuse ([place]) 10/04/2016   Tobacco dependence 10/04/2016   H/O arthrodesis 10/13/2014   Cervical  spondylosis 12/18/2013   Neck pain 11/25/2013   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 Pt had been experiencing right shoulder pain present since August 2022. Pt with a hx of right RCR approximately 40 year ago. Pt was referred to occupational therapy for evaluation and treatment by Dr. Larena Glassman.     PRECAUTIONS: shoulder, see protocol  SUBJECTIVE: S: I have been doing more with this arm than I probably should be. Pushing it a little bit. Not lifting anything heavy though.   PAIN:  Are you having pain? Yes: NPRS scale: 0/10 Pain location: right shoulder Pain description: burning Aggravating factors: using shoulder, movement  Relieving factors: ice, rest      OBJECTIVE:   FUNCTIONAL OUTCOME MEASURES: FOTO: 4/100   UPPER EXTREMITY ROM       Assessed supine, er/IR adducted Passive ROM Right eval  Shoulder flexion 80  Shoulder abduction 65  Shoulder internal rotation 90  Shoulder external rotation 30      UPPER EXTREMITY MMT:      Unable to assess due to pain/precautions MMT Right eval  Shoulder flexion    Shoulder abduction    Shoulder internal rotation    Shoulder external rotation    (Blank rows = not tested)   PATIENT EDUCATION: Education details: Scapular ROM  Person educated: Patient Education method: Explanation, handout, demonstration  Education comprehension: verbalized understanding, returned demonstration      HOME EXERCISE PROGRAM: Eval: table slides, 6/20: AA/ROM supine but without abduction at this time.   02/02/22: Shoulder isometrics  02/08/22: Scapular ROM    TODAY'S TREATMENT:  02/08/22 -Manual therapy: myofascial release completed to upper arm, emphasis on biceps and deltoids -P/ROM: 1x10 supine shoulder flexion, abduction, IR/er -A/ROM: 1x10 supine shoulder flexion, abduction, IR/er -AA/ROM: 1x10 standing horizontal ab/adduction -Thumb tacks: shoulder level, 1x10 reps -Scapular ROM: elevation/depression, row, extension, 1x10  -Shoulder isometrics: flexion, extension, abduction, 5x5" each direction -Pulleys: 1 minute shoulder flexion; 1 minute abduction   02/02/22 -Manual therapy: myofascial release completed to upper arm, front shoulder, pectoralis, and trapezius  -P/ROM: shoulder flexion, abduction, IR/er -A/ROM: seated shoulder flexion and abduction, 1x10 reps each direction  -Isometrics: shoulder flexion, extension, abduction, 5x5" each direction -Thumb tacks at shoulder level x10 reps seated -Wall wash flexion 1 min standing  -Pulleys: 1 minute shoulder flexion; 1 minute abduction    01/31/22 - manual therapy: myofascial release completed to upper arm, front shoulder, pectoralis, and trapezius  - P/ROM: shoulder flexion (75% ROM), abduction (>75%ROM), internal/external rotation, horizontal abduction and adduction 10x each direction.  -AA/ROM x10 reps supine:  flexion, protraction, internal and external rotation, horizontal abduction, abduction -Isometrics: shoulder flexion, extension, abduction, 5x5" each direction -Pulleys: 1 minute shoulder flexion; 1 minute abduction -seated protraction, retraction, elevation, and depression x10 reps each -wall wash flexion 1 minute standing  - thumb tacks at shoulder level x10 reps seated -scapular strengthening standing: retraction and extension with red band x10 reps    GOALS: Goals reviewed with patient? Yes   SHORT TERM GOALS: Target date: 02/09/2022     Pt will be provided with and educated on HEP to improve mobility required for  use of RUE during ADLs.   Goal status: ONGOING   2.  Pt will increase RUE P/ROM to [facility] to improve ability to donn shirts with minimal compensatory strategies.    Goal status: ONGOING   3.   Pt will increase RUE strength to 3/5 or greater to improve ability to reach for items at waist to head height.   Goal status: ONGOING            LONG TERM GOALS: Target date: 03/09/2022     Pt will return to highest level of functioning using RUE as dominant during ADLs.    Goal status: ONGOING    2.  Pt will decrease pain in RUE to 3/10 or less to improve ability to sleep for 2+ hours without waking due to pain.    Goal status: ONGOING    3.  Pt will increase RUE A/ROM to [facility] to improve ability to reach overhead and behind back during dressing and bathing tasks.    Goal status: ONGOING    4.  Pt will increase RUE strength to 4+/5 or greater to improve ability to return to gym tasks and workouts.   Goal status: ONGOING    5.  Pt will decrease RUE fascial restrictions to min amounts or less to improve ability to perform functional reaching tasks during daily task completion.   Goal status: ONGOING       ASSESSMENT:   CLINICAL IMPRESSION: Pt continues with significant tightness throughout the length of the biceps with soft tissue mobilization focusing on this region. Scar tissue massage along length of incision to prevent restrictions. Continued with ROM with pt able to achieve A/ROM almost equal to P/ROM. No pain reported with ROM, therapist cuing for proper positioning and deep breathing. Therapist providing moderate tactile cues for scapular ROM with pt requiring diminishing cues and able to demonstrate with good form, added to HEP. Tolerated addition of AA/ROM shoulder ab/duction with no increased pain.        PLAN: OT FREQUENCY: 2x/week   OT DURATION: 8 weeks   PLANNED INTERVENTIONS: self care/ADL training, therapeutic exercise, therapeutic activity, manual therapy, passive  range of motion, electrical stimulation, ultrasound, moist heat, cryotherapy, patient/family education, and DME and/or AE instructions     CONSULTED AND AGREED WITH PLAN OF CARE: Patient   PLAN FOR NEXT SESSION: Reassessment   Mathews Robinsons, OTR/L [phone]  02/08/2022, 1:52 PM

## 2022-02-10 ENCOUNTER — Ambulatory Visit (HOSPITAL_COMMUNITY): Payer: Medicare HMO

## 2022-02-10 ENCOUNTER — Encounter (HOSPITAL_COMMUNITY): Payer: Self-pay

## 2022-02-10 DIAGNOSIS — M25511 Pain in right shoulder: Secondary | ICD-10-CM | POA: Diagnosis not present

## 2022-02-10 DIAGNOSIS — R29898 Other symptoms and signs involving the musculoskeletal system: Secondary | ICD-10-CM

## 2022-02-10 DIAGNOSIS — M25611 Stiffness of right shoulder, not elsewhere classified: Secondary | ICD-10-CM

## 2022-02-10 NOTE — Therapy (Addendum)
OUTPATIENT OCCUPATIONAL THERAPY TREATMENT NOTE and Progress Note   Patient Name: Stephen Sullivan MRN: [id number] DOB:1956-01-18, 66 y.o., male Today's Date: 02/10/2022  PCP: Dr. Sharlyne Cai  REFERRING PROVIDER: Dr. Larena Glassman    Progress Note Reporting Period 01/11/22 to 02/10/22  See note below for Objective Data and Assessment of Progress/Goals.       END OF SESSION:   OT End of Session - 02/10/22 1252     Visit Number 10    Number of Visits 16    Date for OT Re-Evaluation 03/12/22    Authorization Type [employer]    Progress Note Due on Visit 20    OT Start Time 1250    OT Stop Time 1331    OT Time Calculation (min) 41 min    Activity Tolerance Patient tolerated treatment well    Behavior During Therapy WFL for tasks assessed/performed                 Past Medical History:  Diagnosis Date   Arthritis    Back pain    Cataracts, bilateral    immature   GERD (gastroesophageal reflux disease)    using Baking Soda and Water per pt   Headache(784.0)    related to neck issues   History of bronchitis    History of gastric ulcer 20+yrs ago   Hypertension    takes Benazepril some days   Joint pain    Muscle spasm    takes Flexeril daily   Shortness of breath    with exertion   Weakness    and numbness in left fingers   Past Surgical History:  Procedure Laterality Date   ABDOMINAL AORTOGRAM W/LOWER EXTREMITY N/A 10/18/2020   Procedure: ABDOMINAL AORTOGRAM W/LOWER EXTREMITY;  Surgeon: Waynetta Sandy, MD;  Location: [place] [facility];  Service: Cardiovascular;  Laterality: N/A;   ANTERIOR CERVICAL DECOMP/DISCECTOMY FUSION N/A 12/18/2013   Procedure: ANTERIOR CERVICAL DECOMPRESSION/DISCECTOMY FUSION CERVICAL THREE-FOUR, FOUR-FIVE, FIVE-SIX W/ BONEGRAFT;  Surgeon: Hosie Spangle, MD;  Location: [place] [facility];  Service: Neurosurgery;  Laterality: N/A;  ANTERIOR CERVICAL DECOMPRESSION/DISCECTOMY FUSION CERVICAL THREE-FOUR, FOUR-FIVE, FIVE-SIX  W/ BONEGRAFT   arm surgery Left    COLONOSCOPY WITH PROPOFOL N/A 11/15/2017   Procedure: COLONOSCOPY WITH PROPOFOL;  Surgeon: Daneil Dolin, MD;  [facility];  Service: Endoscopy;  Laterality: N/A;  12:30pm   ESOPHAGOGASTRODUODENOSCOPY (EGD) WITH PROPOFOL N/A 10/05/2016   Procedure: ESOPHAGOGASTRODUODENOSCOPY (EGD) WITH PROPOFOL;  Surgeon: Daneil Dolin, MD;  [facility];  Service: Endoscopy;  Laterality: N/A;   ESOPHAGOGASTRODUODENOSCOPY (EGD) WITH PROPOFOL N/A 02/15/2017   Procedure: ESOPHAGOGASTRODUODENOSCOPY (EGD) WITH PROPOFOL;  Surgeon: Daneil Dolin, MD;  [facility];  Service: Endoscopy;  Laterality: N/A;  [place] Right 12/22/2021   Procedure: HARDWARE REMOVAL;  Surgeon: Mordecai Rasmussen, MD;  Location[facility];  Service: Orthopedics;  Laterality: Right;   INGUINAL HERNIA REPAIR Right 08/18/2016   Procedure: RIGHT INGUINAL HERNORRHAPHY WITH MESH;  Surgeon: Aviva Signs, MD;  Location[facility];  Service: General;  Laterality: Right;   JOINT REPLACEMENT Right    hip    PERIPHERAL VASCULAR INTERVENTION Right 10/18/2020   Procedure: PERIPHERAL VASCULAR INTERVENTION;  Surgeon: Waynetta Sandy, MD;  Location: [place] [facility];  Service: Cardiovascular;  Laterality: Right;   POLYPECTOMY  11/15/2017   Procedure: POLYPECTOMY;  Surgeon: Daneil Dolin, MD;  [facility];  Service: Endoscopy;;  polyp cs splenic  flexure times 2,  cecal polyp   REVERSE SHOULDER ARTHROPLASTY Right 12/22/2021   Procedure: REVERSE SHOULDER ARTHROPLASTY;  Surgeon: Mordecai Rasmussen, MD;  [facility];  Service: Orthopedics;  Laterality: Right;   SHOULDER SURGERY Right    x 3    Patient Active Problem List   Diagnosis Date Noted   Glenohumeral arthritis, right 11/03/2021   GERD (gastroesophageal reflux disease) 10/15/2017   Constipation 01/09/2017   Gastrointestinal hemorrhage    Acute gastric ulcer    Acute esophagitis    GI bleed 10/04/2016    Polysubstance abuse ([place]) 10/04/2016   Tobacco dependence 10/04/2016   H/O arthrodesis 10/13/2014   Cervical spondylosis 12/18/2013   Neck pain 11/25/2013   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 Pt had been experiencing right shoulder pain present since August 2022. Pt with a hx of right RCR approximately 40 year ago. Pt was referred to occupational therapy for evaluation and treatment by Dr. Larena Glassman.     PRECAUTIONS: shoulder, see protocol  SUBJECTIVE: S: I am in so much pain in my leg I can barely stand it. I was in tears before I can here. I was able to put [place] on and tie my shoes.   PAIN:  Are you having pain? Yes: NPRS scale: 0/10 Pain location: right shoulder Pain description: burning Aggravating factors: using shoulder, movement  Relieving factors: ice, rest      OBJECTIVE:   FUNCTIONAL OUTCOME MEASURES: FOTO: 4/100 02/10/22 65.92   UPPER EXTREMITY ROM       Assessed supine, er/IR adducted Passive ROM Right eval Right 02/10/22  Shoulder flexion 80 128  Shoulder abduction 65 95  Shoulder internal rotation 90 90  Shoulder external rotation 30 38    Assessed supine, er/IR adducted AROM Right eval Right 02/10/22  Shoulder flexion  120  Shoulder abduction  90  Shoulder internal rotation  90  Shoulder external rotation  35     UPPER EXTREMITY MMT:      Unable to assess due to pain/precautions MMT Right eval Right  02/10/22  Shoulder flexion   3+  Shoulder abduction   3+  Shoulder internal rotation   3+  Shoulder  external rotation   3+  (Blank rows = not tested)   PATIENT EDUCATION: Education details: Scapular ROM  Person educated: Patient Education method: Explanation, handout, demonstration  Education comprehension: verbalized understanding, returned demonstration      HOME EXERCISE PROGRAM: Eval: table slides, 6/20: AA/ROM supine but without abduction at this time.  02/02/22: Shoulder isometrics  02/08/22: Scapular ROM    TODAY'S TREATMENT:  02/10/22 -P/ROM: 1x10 supine shoulder flexion, abduction, IR/er -AA/ROM: 1x10 standing flexion, horizontal ab/adduction, protraction, abduction, IR/er -Proximal Shoulder Strengthening: 1x30" each movement, paddles, criss cross, CW and CCW circles  -Theraband Scapular Strengthening: 1x10 red theraband, row, extension -Functional Reach: retrieving and replacing items from middle shelf x2  02/08/22 -Manual therapy: myofascial release completed to upper arm, emphasis on biceps and deltoids -P/ROM: 1x10 supine shoulder flexion, abduction, IR/er -A/ROM: 1x10 supine shoulder flexion, abduction, IR/er -AA/ROM: 1x10 standing horizontal ab/adduction -  Thumb tacks: shoulder level, 1x10 reps -Scapular ROM: elevation/depression, row, extension, 1x10  -Shoulder isometrics: flexion, extension, abduction, 5x5" each direction -Pulleys: 1 minute shoulder flexion; 1 minute abduction   02/02/22 -Manual therapy: myofascial release completed to upper arm, front shoulder, pectoralis, and trapezius  -P/ROM: shoulder flexion, abduction, IR/er -A/ROM: seated shoulder flexion and abduction, 1x10 reps each direction  -Isometrics: shoulder flexion, extension, abduction, 5x5" each direction -Thumb tacks at shoulder level x10 reps seated -Wall wash flexion 1 min standing  -Pulleys: 1 minute shoulder flexion; 1 minute abduction     GOALS: Goals reviewed with patient? Yes   SHORT TERM GOALS: Target date: 02/09/2022     Pt will be provided with and educated on HEP to improve  mobility required for use of RUE during ADLs.   Goal status: MET   2.  Pt will increase RUE P/ROM to [facility] to improve ability to donn shirts with minimal compensatory strategies.    Goal status: ONGOING   3.   Pt will increase RUE strength to 3/5 or greater to improve ability to reach for items at waist to head height.   Goal status: MET           LONG TERM GOALS: Target date: 03/09/2022     Pt will return to highest level of functioning using RUE as dominant during ADLs.    Goal status: ONGOING    2.  Pt will decrease pain in RUE to 3/10 or less to improve ability to sleep for 2+ hours without waking due to pain.    Goal status: ONGOING    3.  Pt will increase RUE A/ROM to [facility] to improve ability to reach overhead and behind back during dressing and bathing tasks.    Goal status: ONGOING    4.  Pt will increase RUE strength to 4+/5 or greater to improve ability to return to gym tasks and workouts.   Goal status: ONGOING    5.  Pt will decrease RUE fascial restrictions to min amounts or less to improve ability to perform functional reaching tasks during daily task completion.   Goal status: ONGOING       ASSESSMENT:   CLINICAL IMPRESSION: A: Measurements taken today for reassessment with pt making good progress towards short term goals. He has not yet achieved P/ROM [facility] but tolerates passive stretch with no increased pain. Functional outcome measure score limited by weight precautions, otherwise pt reports being able to do most functional activities at home. Progressed proximal shoulder strengthening today with pt reporting no increased pain but noticeable fatigue, requiring rest breaks between each movement. Also progressed scapular ROM to scapular strengthening, therapist providing moderate tactile cues for form and proper muscle recruitment. Mild restrictions remain throughout length of bicep and minimal restrictions throughout upper trapezius.    PLAN: OT FREQUENCY:  2x/week   OT DURATION: 8 weeks   PLANNED INTERVENTIONS: self care/ADL training, therapeutic exercise, therapeutic activity, manual therapy, passive range of motion, electrical stimulation, ultrasound, moist heat, cryotherapy, patient/family education, and DME and/or AE instructions     CONSULTED AND AGREED WITH PLAN OF CARE: Patient   PLAN FOR NEXT SESSION: Proximal shoulder strengthening and light resistance   Mathews Robinsons, OTR/L [phone]  02/10/2022, 1:54 PM

## 2022-02-13 ENCOUNTER — Encounter (HOSPITAL_COMMUNITY): Payer: Self-pay

## 2022-02-13 ENCOUNTER — Ambulatory Visit (HOSPITAL_COMMUNITY): Payer: Medicare HMO

## 2022-02-13 DIAGNOSIS — R29898 Other symptoms and signs involving the musculoskeletal system: Secondary | ICD-10-CM

## 2022-02-13 DIAGNOSIS — M25611 Stiffness of right shoulder, not elsewhere classified: Secondary | ICD-10-CM

## 2022-02-13 DIAGNOSIS — M25511 Pain in right shoulder: Secondary | ICD-10-CM

## 2022-02-13 NOTE — Therapy (Signed)
OUTPATIENT OCCUPATIONAL THERAPY TREATMENT NOTE   Patient Name: Stephen Sullivan MRN: [id number] DOB:01-08-1956, 66 y.o., male Today's Date: 02/13/2022  PCP: Dr. Sharlyne Cai  REFERRING PROVIDER: Dr. Larena Glassman      END OF SESSION:   OT End of Session - 02/13/22 1350     Visit Number 11    Number of Visits 16    Date for OT Re-Evaluation 03/12/22    Authorization Type [employer]    Progress Note Due on Visit 20    OT Start Time 1348    OT Stop Time 1430    OT Time Calculation (min) 42 min    Activity Tolerance Patient tolerated treatment well    Behavior During Therapy WFL for tasks assessed/performed                 Past Medical History:  Diagnosis Date   Arthritis    Back pain    Cataracts, bilateral    immature   GERD (gastroesophageal reflux disease)    using Baking Soda and Water per pt   Headache(784.0)    related to neck issues   History of bronchitis    History of gastric ulcer 20+yrs ago   Hypertension    takes Benazepril some days   Joint pain    Muscle spasm    takes Flexeril daily   Shortness of breath    with exertion   Weakness    and numbness in left fingers   Past Surgical History:  Procedure Laterality Date   ABDOMINAL AORTOGRAM W/LOWER EXTREMITY N/A 10/18/2020   Procedure: ABDOMINAL AORTOGRAM W/LOWER EXTREMITY;  Surgeon: Waynetta Sandy, MD;  Location: [place] [facility];  Service: Cardiovascular;  Laterality: N/A;   ANTERIOR CERVICAL DECOMP/DISCECTOMY FUSION N/A 12/18/2013   Procedure: ANTERIOR CERVICAL DECOMPRESSION/DISCECTOMY FUSION CERVICAL THREE-FOUR, FOUR-FIVE, FIVE-SIX W/ BONEGRAFT;  Surgeon: Hosie Spangle, MD;  Location: [place] [facility];  Service: Neurosurgery;  Laterality: N/A;  ANTERIOR CERVICAL DECOMPRESSION/DISCECTOMY FUSION CERVICAL THREE-FOUR, FOUR-FIVE, FIVE-SIX W/ BONEGRAFT   arm surgery Left    COLONOSCOPY WITH PROPOFOL N/A 11/15/2017   Procedure: COLONOSCOPY WITH PROPOFOL;  Surgeon: Daneil Dolin,  MD;  [facility];  Service: Endoscopy;  Laterality: N/A;  12:30pm   ESOPHAGOGASTRODUODENOSCOPY (EGD) WITH PROPOFOL N/A 10/05/2016   Procedure: ESOPHAGOGASTRODUODENOSCOPY (EGD) WITH PROPOFOL;  Surgeon: Daneil Dolin, MD;  [facility];  Service: Endoscopy;  Laterality: N/A;   ESOPHAGOGASTRODUODENOSCOPY (EGD) WITH PROPOFOL N/A 02/15/2017   Procedure: ESOPHAGOGASTRODUODENOSCOPY (EGD) WITH PROPOFOL;  Surgeon: Daneil Dolin, MD;  [facility];  Service: Endoscopy;  Laterality: N/A;  [place] Right 12/22/2021   Procedure: HARDWARE REMOVAL;  Surgeon: Mordecai Rasmussen, MD;  Location: [facility];  Service: Orthopedics;  Laterality: Right;   INGUINAL HERNIA REPAIR Right 08/18/2016   Procedure: RIGHT INGUINAL HERNORRHAPHY WITH MESH;  Surgeon: Aviva Signs, MD;  Location: [facility];  Service: General;  Laterality: Right;   JOINT REPLACEMENT Right    hip    PERIPHERAL VASCULAR INTERVENTION Right 10/18/2020   Procedure: PERIPHERAL VASCULAR INTERVENTION;  Surgeon: Waynetta Sandy, MD;  Location: [place] [facility];  Service: Cardiovascular;  Laterality: Right;   POLYPECTOMY  11/15/2017   Procedure: POLYPECTOMY;  Surgeon: Daneil Dolin, MD;  [facility];  Service: Endoscopy;;  polyp cs splenic flexure times 2,  cecal polyp   REVERSE SHOULDER ARTHROPLASTY Right 12/22/2021   Procedure: REVERSE SHOULDER ARTHROPLASTY;  Surgeon: Mordecai Rasmussen, MD;  Location: [facility];  Service: Orthopedics;  Laterality: Right;   SHOULDER SURGERY Right    x 3    Patient Active Problem List   Diagnosis Date Noted   Glenohumeral arthritis, right 11/03/2021   GERD (gastroesophageal reflux disease) 10/15/2017   Constipation 01/09/2017   Gastrointestinal hemorrhage    Acute gastric ulcer    Acute esophagitis    GI bleed 10/04/2016   Polysubstance abuse ([place]) 10/04/2016   Tobacco dependence 10/04/2016   H/O arthrodesis 10/13/2014   Cervical spondylosis 12/18/2013   Neck  pain 11/25/2013   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 Pt had been experiencing right shoulder pain present since August 2022. Pt with a hx of right RCR approximately 40 year ago. Pt was referred to occupational therapy for evaluation and treatment by Dr. Larena Glassman.     PRECAUTIONS: shoulder, see protocol  SUBJECTIVE: S: I couldn't believe it, I clipped my toe nails.   PAIN:  Are you having pain? Yes: NPRS scale: 1/10 Pain location: right shoulder Pain description: discomfort  Aggravating factors: using shoulder, movement  Relieving factors: ice, rest      OBJECTIVE:   FUNCTIONAL OUTCOME MEASURES: FOTO: 4/100 02/10/22 65.92   UPPER EXTREMITY ROM       Assessed supine, er/IR adducted Passive ROM Right eval Right 02/10/22  Shoulder flexion 80 128  Shoulder abduction 65 95  Shoulder internal rotation 90 90  Shoulder external rotation 30 38    Assessed supine, er/IR adducted AROM Right eval Right 02/10/22  Shoulder flexion  120  Shoulder abduction  90  Shoulder internal rotation  90  Shoulder external rotation  35     UPPER EXTREMITY MMT:      Unable to assess due to pain/precautions MMT Right eval Right  02/10/22  Shoulder flexion   3+  Shoulder abduction   3+  Shoulder internal rotation   3+  Shoulder external rotation   3+  (Blank rows = not tested)   PATIENT EDUCATION: Education details: heat as a modality  Person educated: Patient Education method: Explanation Education comprehension: verbalized understanding     HOME  EXERCISE PROGRAM: Eval: table slides, 6/20: AA/ROM supine but without abduction at this time.  02/02/22: Shoulder isometrics  02/08/22: Scapular ROM    TODAY'S TREATMENT:  02/13/22 -Manual Therapy: myofascial release and soft tissue mobilization to upper trapezius and distal biceps  -P/ROM: 1x10 supine shoulder flexion, abduction, IR/er -AA/ROM: 1x10 standing flexion, horizontal ab/adduction, protraction, abduction, IR/er -Wall Wash: 2x30 seconds forward flexion and abduction   02/10/22 -P/ROM: 1x10 supine shoulder flexion, abduction, IR/er -AA/ROM: 1x10 standing flexion, horizontal ab/adduction, protraction, abduction, IR/er -Proximal Shoulder Strengthening: 1x30" each movement, paddles, criss cross, CW and CCW circles  -Theraband Scapular Strengthening: 1x10 red theraband, row, extension -Functional Reach: retrieving and replacing items from middle shelf x2  02/08/22 -Manual therapy: myofascial release completed to upper arm, emphasis on biceps and deltoids -P/ROM: 1x10 supine shoulder flexion, abduction, IR/er -A/ROM: 1x10 supine shoulder flexion, abduction, IR/er -AA/ROM: 1x10 standing horizontal ab/adduction -Thumb tacks: shoulder level, 1x10 reps -Scapular ROM: elevation/depression, row, extension,  1x10  -Shoulder isometrics: flexion, extension, abduction, 5x5" each direction -Pulleys: 1 minute shoulder flexion; 1 minute abduction      GOALS: Goals reviewed with patient? Yes   SHORT TERM GOALS: Target date: 02/09/2022     Pt will be provided with and educated on HEP to improve mobility required for use of RUE during ADLs.   Goal status: MET   2.  Pt will increase RUE P/ROM to [facility] to improve ability to donn shirts with minimal compensatory strategies.    Goal status: ONGOING   3.   Pt will increase RUE strength to 3/5 or greater to improve ability to reach for items at waist to head height.   Goal status: MET           LONG TERM GOALS: Target date: 03/09/2022     Pt  will return to highest level of functioning using RUE as dominant during ADLs.    Goal status: ONGOING    2.  Pt will decrease pain in RUE to 3/10 or less to improve ability to sleep for 2+ hours without waking due to pain.    Goal status: ONGOING    3.  Pt will increase RUE A/ROM to [facility] to improve ability to reach overhead and behind back during dressing and bathing tasks.    Goal status: ONGOING    4.  Pt will increase RUE strength to 4+/5 or greater to improve ability to return to gym tasks and workouts.   Goal status: ONGOING    5.  Pt will decrease RUE fascial restrictions to min amounts or less to improve ability to perform functional reaching tasks during daily task completion.   Goal status: ONGOING       ASSESSMENT:   CLINICAL IMPRESSION: A: Pt reports some discomfort across upper trapezius region today, addressed with myofascial release. Soft tissue mobilization, across the length of the biceps, emphasis at distal biceps, completed to decrease fascial restrictions impeding on shoulder ROM. Pt continues to progress with P/ROM and AA/ROM with no reports of pain, only a "good stretch." Improved endurance noted today with wall wash, pt able to maintain RUE at approximately 90 degrees for both forward flexion and abduction.     PLAN: OT FREQUENCY: 2x/week   OT DURATION: 8 weeks   PLANNED INTERVENTIONS: self care/ADL training, therapeutic exercise, therapeutic activity, manual therapy, passive range of motion, electrical stimulation, ultrasound, moist heat, cryotherapy, patient/family education, and DME and/or AE instructions     CONSULTED AND AGREED WITH PLAN OF CARE: Patient   PLAN FOR NEXT SESSION: Proximal shoulder strengthening and light resistance, shoulder wall stretch for HEP   [employer], OTR/L [phone]  02/13/2022, 2:56 PM

## 2022-02-15 ENCOUNTER — Encounter (HOSPITAL_COMMUNITY): Payer: Medicare HMO | Admitting: Occupational Therapy

## 2022-02-17 ENCOUNTER — Encounter (HOSPITAL_COMMUNITY): Payer: Self-pay | Admitting: Occupational Therapy

## 2022-02-17 ENCOUNTER — Ambulatory Visit (HOSPITAL_COMMUNITY): Payer: Medicare HMO | Admitting: Occupational Therapy

## 2022-02-17 DIAGNOSIS — M25511 Pain in right shoulder: Secondary | ICD-10-CM | POA: Diagnosis not present

## 2022-02-17 DIAGNOSIS — M25611 Stiffness of right shoulder, not elsewhere classified: Secondary | ICD-10-CM

## 2022-02-17 NOTE — Therapy (Signed)
OUTPATIENT OCCUPATIONAL THERAPY TREATMENT NOTE   Patient Name: Stephen Sullivan MRN: [id number] DOB:17-Oct-1955, 66 y.o., male Today's Date: 02/17/2022  PCP: Dr. Sharlyne Cai  REFERRING PROVIDER: Dr. Larena Glassman      END OF SESSION:   OT End of Session - 02/17/22 1439     Visit Number 12    Number of Visits 16    Date for OT Re-Evaluation 03/12/22    Authorization Type [employer]    Progress Note Due on Visit 20    OT Start Time 1355    OT Stop Time 1440    OT Time Calculation (min) 45 min    Activity Tolerance Patient tolerated treatment well    Behavior During Therapy WFL for tasks assessed/performed                  Past Medical History:  Diagnosis Date   Arthritis    Back pain    Cataracts, bilateral    immature   GERD (gastroesophageal reflux disease)    using Baking Soda and Water per pt   Headache(784.0)    related to neck issues   History of bronchitis    History of gastric ulcer 20+yrs ago   Hypertension    takes Benazepril some days   Joint pain    Muscle spasm    takes Flexeril daily   Shortness of breath    with exertion   Weakness    and numbness in left fingers   Past Surgical History:  Procedure Laterality Date   ABDOMINAL AORTOGRAM W/LOWER EXTREMITY N/A 10/18/2020   Procedure: ABDOMINAL AORTOGRAM W/LOWER EXTREMITY;  Surgeon: Waynetta Sandy, MD;  Location: [place] [facility];  Service: Cardiovascular;  Laterality: N/A;   ANTERIOR CERVICAL DECOMP/DISCECTOMY FUSION N/A 12/18/2013   Procedure: ANTERIOR CERVICAL DECOMPRESSION/DISCECTOMY FUSION CERVICAL THREE-FOUR, FOUR-FIVE, FIVE-SIX W/ BONEGRAFT;  Surgeon: Hosie Spangle, MD;  Location: [place] [facility];  Service: Neurosurgery;  Laterality: N/A;  ANTERIOR CERVICAL DECOMPRESSION/DISCECTOMY FUSION CERVICAL THREE-FOUR, FOUR-FIVE, FIVE-SIX W/ BONEGRAFT   arm surgery Left    COLONOSCOPY WITH PROPOFOL N/A 11/15/2017   Procedure: COLONOSCOPY WITH PROPOFOL;  Surgeon: Daneil Dolin, MD;  [facility];  Service: Endoscopy;  Laterality: N/A;  12:30pm   ESOPHAGOGASTRODUODENOSCOPY (EGD) WITH PROPOFOL N/A 10/05/2016   Procedure: ESOPHAGOGASTRODUODENOSCOPY (EGD) WITH PROPOFOL;  Surgeon: Daneil Dolin, MD;  [facility];  Service: Endoscopy;  Laterality: N/A;   ESOPHAGOGASTRODUODENOSCOPY (EGD) WITH PROPOFOL N/A 02/15/2017   Procedure: ESOPHAGOGASTRODUODENOSCOPY (EGD) WITH PROPOFOL;  Surgeon: Daneil Dolin, MD;  [facility];  Service: Endoscopy;  Laterality: N/A;  [place] Right 12/22/2021   Procedure: HARDWARE REMOVAL;  Surgeon: Mordecai Rasmussen, MD;  Location: [facility];  Service: Orthopedics;  Laterality: Right;   INGUINAL HERNIA REPAIR Right 08/18/2016   Procedure: RIGHT INGUINAL HERNORRHAPHY WITH MESH;  Surgeon: Aviva Signs, MD;  Location: [facility];  Service: General;  Laterality: Right;   JOINT REPLACEMENT Right    hip    PERIPHERAL VASCULAR INTERVENTION Right 10/18/2020   Procedure: PERIPHERAL VASCULAR INTERVENTION;  Surgeon: Waynetta Sandy, MD;  Location: [place] [facility];  Service: Cardiovascular;  Laterality: Right;   POLYPECTOMY  11/15/2017   Procedure: POLYPECTOMY;  Surgeon: Daneil Dolin, MD;  [facility];  Service: Endoscopy;;  polyp cs splenic flexure times 2,  cecal polyp   REVERSE SHOULDER ARTHROPLASTY Right 12/22/2021   Procedure: REVERSE SHOULDER ARTHROPLASTY;  Surgeon: Mordecai Rasmussen,  MD;  Location: [facility];  Service: Orthopedics;  Laterality: Right;   SHOULDER SURGERY Right    x 3    Patient Active Problem List   Diagnosis Date Noted   Glenohumeral arthritis, right 11/03/2021   GERD (gastroesophageal reflux disease) 10/15/2017   Constipation 01/09/2017   Gastrointestinal hemorrhage    Acute gastric ulcer    Acute esophagitis    GI bleed 10/04/2016   Polysubstance abuse ([place]) 10/04/2016   Tobacco dependence 10/04/2016   H/O arthrodesis 10/13/2014   Cervical spondylosis 12/18/2013    Neck pain 11/25/2013   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 Pt had been experiencing right shoulder pain present since August 2022. Pt with a hx of right RCR approximately 40 year ago. Pt was referred to occupational therapy for evaluation and treatment by Dr. Larena Glassman.     PRECAUTIONS: shoulder, see protocol  SUBJECTIVE: S: My neck hurt for a day after I put all those things away on those shelves here  PAIN:  Are you having pain? No      OBJECTIVE:   FUNCTIONAL OUTCOME MEASURES: FOTO: 4/100 02/10/22 65.92   UPPER EXTREMITY ROM       Assessed supine, er/IR adducted Passive ROM Right eval Right 02/10/22  Shoulder flexion 80 128  Shoulder abduction 65 95  Shoulder internal rotation 90 90  Shoulder external rotation 30 38    Assessed supine, er/IR adducted AROM Right eval Right 02/10/22  Shoulder flexion  120  Shoulder abduction  90  Shoulder internal rotation  90  Shoulder external rotation  35     UPPER EXTREMITY MMT:      Unable to assess due to pain/precautions MMT Right eval Right  02/10/22  Shoulder flexion   3+  Shoulder abduction   3+  Shoulder internal rotation   3+  Shoulder external rotation   3+  (Blank rows = not tested)   PATIENT EDUCATION: Education details: heat as a modality  Person educated: Patient Education method: Explanation Education comprehension: verbalized understanding     HOME EXERCISE PROGRAM: Eval: table slides, 6/20: AA/ROM supine but without abduction at this time.  02/02/22: Shoulder isometrics  02/08/22: Scapular ROM    TODAY'S TREATMENT:  02/17/22 -  -Manual Therapy: myofascial release and soft tissue mobilization to upper trapezius and distal biceps  -P/ROM: 1x10 supine shoulder flexion, abduction, IR/er -AA/ROM: 1x10 standing flexion, horizontal ab/adduction, protraction, abduction, IR/er - Wall stretches: standing facing wall walking hand up into shoulder flexion and holding for 30 seconds 2x, shoulder abduction 30 seconds 2x  - Functional reaching: sitting at table level, using RUE to squeeze and place clips on tree on top of elevated surface - Theraband: 1x10 red theraband, row, extension -Proximal Shoulder Strengthening: 1x30" each movement, paddles, criss cross, CW and CCW circles   02/13/22 -Manual Therapy: myofascial release and soft tissue mobilization to upper trapezius and distal biceps  -P/ROM: 1x10 supine shoulder flexion, abduction, IR/er -AA/ROM: 1x10 standing flexion, horizontal ab/adduction, protraction, abduction, IR/er -Wall Wash: 2x30 seconds forward flexion and abduction   02/10/22 -P/ROM: 1x10 supine shoulder flexion, abduction, IR/er -AA/ROM: 1x10 standing flexion, horizontal ab/adduction, protraction,  abduction, IR/er -Proximal Shoulder Strengthening: 1x30" each movement, paddles, criss cross, CW and CCW circles  -Theraband Scapular Strengthening: 1x10 red theraband, row, extension -Functional Reach: retrieving and replacing items from middle shelf x2      GOALS: Goals reviewed with patient? Yes   SHORT TERM GOALS: Target date: 02/09/2022     Pt will be provided with and educated on HEP to improve mobility required for use of RUE during ADLs.   Goal status: MET   2.  Pt will increase RUE P/ROM to [facility] to improve ability to donn shirts with minimal compensatory strategies.    Goal status: ONGOING   3.   Pt will increase RUE strength to 3/5 or greater to improve ability to reach for items at waist to head height.   Goal status: MET           LONG TERM GOALS: Target date: 03/09/2022     Pt will return  to highest level of functioning using RUE as dominant during ADLs.    Goal status: ONGOING    2.  Pt will decrease pain in RUE to 3/10 or less to improve ability to sleep for 2+ hours without waking due to pain.    Goal status: ONGOING    3.  Pt will increase RUE A/ROM to [facility] to improve ability to reach overhead and behind back during dressing and bathing tasks.    Goal status: ONGOING    4.  Pt will increase RUE strength to 4+/5 or greater to improve ability to return to gym tasks and workouts.   Goal status: ONGOING    5.  Pt will decrease RUE fascial restrictions to min amounts or less to improve ability to perform functional reaching tasks during daily task completion.   Goal status: ONGOING       ASSESSMENT:   CLINICAL IMPRESSION: A:  Pt. Reports that he had some neck pain after last session when he completed reaching into over head shelves. He stated that he has not been having much pain lately and the heating pad has been helpful.  He reports that he has been able to complete reaching activities at home without much pain. VC throughout session for body posture/position.     PLAN: OT FREQUENCY: 2x/week   OT DURATION: 8 weeks   PLANNED INTERVENTIONS: self care/ADL training, therapeutic exercise, therapeutic activity, manual therapy, passive range of motion, electrical stimulation, ultrasound, moist heat, cryotherapy, patient/family education, and DME and/or AE instructions     CONSULTED AND AGREED WITH PLAN OF CARE: Patient   PLAN FOR NEXT SESSION: Proximal shoulder strengthening and light resistance, functional reach activity for shoulder flexion    Arvil Persons, OTR/L [phone]  02/17/2022, 2:41 PM

## 2022-02-22 ENCOUNTER — Ambulatory Visit (HOSPITAL_COMMUNITY): Payer: Medicare HMO | Admitting: Occupational Therapy

## 2022-02-22 ENCOUNTER — Encounter (HOSPITAL_COMMUNITY): Payer: Self-pay | Admitting: Occupational Therapy

## 2022-02-22 DIAGNOSIS — R29898 Other symptoms and signs involving the musculoskeletal system: Secondary | ICD-10-CM

## 2022-02-22 DIAGNOSIS — M25511 Pain in right shoulder: Secondary | ICD-10-CM

## 2022-02-22 DIAGNOSIS — M25611 Stiffness of right shoulder, not elsewhere classified: Secondary | ICD-10-CM

## 2022-02-22 NOTE — Therapy (Signed)
OUTPATIENT OCCUPATIONAL THERAPY TREATMENT NOTE   Patient Name: Stephen Sullivan MRN: [id number] DOB:1956-06-11, 66 y.o., male Today's Date: 02/22/2022  PCP: Dr. Sharlyne Cai  REFERRING PROVIDER: Dr. Larena Glassman      END OF SESSION:   OT End of Session - 02/22/22 1108     Visit Number 13    Number of Visits 16    Date for OT Re-Evaluation 03/12/22    Authorization Type [employer]    Progress Note Due on Visit 20    OT Start Time 1030    OT Stop Time 1112    OT Time Calculation (min) 42 min    Activity Tolerance Patient tolerated treatment well    Behavior During Therapy WFL for tasks assessed/performed                   Past Medical History:  Diagnosis Date   Arthritis    Back pain    Cataracts, bilateral    immature   GERD (gastroesophageal reflux disease)    using Baking Soda and Water per pt   Headache(784.0)    related to neck issues   History of bronchitis    History of gastric ulcer 20+yrs ago   Hypertension    takes Benazepril some days   Joint pain    Muscle spasm    takes Flexeril daily   Shortness of breath    with exertion   Weakness    and numbness in left fingers   Past Surgical History:  Procedure Laterality Date   ABDOMINAL AORTOGRAM W/LOWER EXTREMITY N/A 10/18/2020   Procedure: ABDOMINAL AORTOGRAM W/LOWER EXTREMITY;  Surgeon: Waynetta Sandy, MD;  Location: [place] [facility];  Service: Cardiovascular;  Laterality: N/A;   ANTERIOR CERVICAL DECOMP/DISCECTOMY FUSION N/A 12/18/2013   Procedure: ANTERIOR CERVICAL DECOMPRESSION/DISCECTOMY FUSION CERVICAL THREE-FOUR, FOUR-FIVE, FIVE-SIX W/ BONEGRAFT;  Surgeon: Hosie Spangle, MD;  Location: [place] [facility];  Service: Neurosurgery;  Laterality: N/A;  ANTERIOR CERVICAL DECOMPRESSION/DISCECTOMY FUSION CERVICAL THREE-FOUR, FOUR-FIVE, FIVE-SIX W/ BONEGRAFT   arm surgery Left    COLONOSCOPY WITH PROPOFOL N/A 11/15/2017   Procedure: COLONOSCOPY WITH PROPOFOL;  Surgeon: Daneil Dolin, MD;  [facility];  Service: Endoscopy;  Laterality: N/A;  12:30pm   ESOPHAGOGASTRODUODENOSCOPY (EGD) WITH PROPOFOL N/A 10/05/2016   Procedure: ESOPHAGOGASTRODUODENOSCOPY (EGD) WITH PROPOFOL;  Surgeon: Daneil Dolin, MD;  [facility];  Service: Endoscopy;  Laterality: N/A;   ESOPHAGOGASTRODUODENOSCOPY (EGD) WITH PROPOFOL N/A 02/15/2017   Procedure: ESOPHAGOGASTRODUODENOSCOPY (EGD) WITH PROPOFOL;  Surgeon: Daneil Dolin, MD;  [facility];  Service: Endoscopy;  Laterality: N/A;  [place] Right 12/22/2021   Procedure: HARDWARE REMOVAL;  Surgeon: Mordecai Rasmussen, MD;  Location: [facility];  Service: Orthopedics;  Laterality: Right;   INGUINAL HERNIA REPAIR Right 08/18/2016   Procedure: RIGHT INGUINAL HERNORRHAPHY WITH MESH;  Surgeon: Aviva Signs, MD;  Location: [facility];  Service: General;  Laterality: Right;   JOINT REPLACEMENT Right    hip    PERIPHERAL VASCULAR INTERVENTION Right 10/18/2020   Procedure: PERIPHERAL VASCULAR INTERVENTION;  Surgeon: Waynetta Sandy, MD;  Location: [place] [facility];  Service: Cardiovascular;  Laterality: Right;   POLYPECTOMY  11/15/2017   Procedure: POLYPECTOMY;  Surgeon: Daneil Dolin, MD;  [facility];  Service: Endoscopy;;  polyp cs splenic flexure times 2,  cecal polyp   REVERSE SHOULDER ARTHROPLASTY Right 12/22/2021   Procedure: REVERSE SHOULDER ARTHROPLASTY;  Surgeon: Larena Glassman  A, MD;  Location: [facility];  Service: Orthopedics;  Laterality: Right;   SHOULDER SURGERY Right    x 3    Patient Active Problem List   Diagnosis Date Noted   Glenohumeral arthritis, right 11/03/2021   GERD (gastroesophageal reflux disease) 10/15/2017   Constipation 01/09/2017   Gastrointestinal hemorrhage    Acute gastric ulcer    Acute esophagitis    GI bleed 10/04/2016   Polysubstance abuse ([place]) 10/04/2016   Tobacco dependence 10/04/2016   H/O arthrodesis 10/13/2014   Cervical spondylosis  12/18/2013   Neck pain 11/25/2013   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 Pt had been experiencing right shoulder pain present since August 2022. Pt with a hx of right RCR approximately 40 year ago. Pt was referred to occupational therapy for evaluation and treatment by Dr. Larena Glassman.     PRECAUTIONS: shoulder, see protocol  SUBJECTIVE: S: I've been pulling up cudzo.   PAIN:  Are you having pain? No      OBJECTIVE:   FUNCTIONAL OUTCOME MEASURES: FOTO: 4/100 02/10/22 65.92   UPPER EXTREMITY ROM       Assessed supine, er/IR adducted Passive ROM Right eval Right 02/10/22  Shoulder flexion 80 128  Shoulder abduction 65 95  Shoulder internal rotation 90 90  Shoulder external rotation 30 38    Assessed supine, er/IR adducted AROM Right eval Right 02/10/22  Shoulder flexion  120  Shoulder abduction  90  Shoulder internal rotation  90  Shoulder external rotation  35     UPPER EXTREMITY MMT:      Unable to assess due to pain/precautions MMT Right eval Right  02/10/22  Shoulder flexion   3+  Shoulder abduction   3+  Shoulder internal rotation   3+  Shoulder external rotation   3+  (Blank rows = not tested)   PATIENT EDUCATION: Education details:A/ROM Person educated: Patient Education method: Explanation Education comprehension: verbalized understanding     HOME EXERCISE PROGRAM: Eval: table slides, 6/20: AA/ROM supine but without abduction at this time.  02/02/22: Shoulder isometrics  02/08/22: Scapular ROM  7/19:  A/ROM   TODAY'S TREATMENT: 02/22/22 -Manual Therapy: myofascial release and soft tissue mobilization to upper trapezius and distal biceps to decrease pain and fascial restrictions and increase joint ROM -P/ROM: supine, flexion, abduction, er/IR, horizontal abduction, 5 reps each -A/ROM: supine, protraction, flexion, abduction, er/IR, horizontal abduction, 10 reps -A/ROM: Sullivan, protraction, flexion, abduction, er/IR, horizontal abduction, 10 reps -Scapular theraband: red, row, extension, retraction, 10 reps each -Overhead lacing: seated, lacing from top down then reversing -UBE: level 1, 3' forward, 3' reverse, pace: 3.5  02/17/22 - -Manual Therapy: myofascial release and soft tissue mobilization to upper trapezius and distal biceps  -P/ROM: 1x10 supine shoulder flexion, abduction, IR/er -AA/ROM: 1x10 Sullivan flexion, horizontal ab/adduction, protraction, abduction, IR/er - Wall stretches: Sullivan facing wall walking hand up into shoulder flexion and holding for 30 seconds 2x, shoulder abduction 30 seconds 2x  - Functional reaching: sitting at table level, using RUE to squeeze and place clips on tree on top of elevated surface -  Theraband: 1x10 red theraband, row, extension -Proximal Shoulder Strengthening: 1x30" each movement, paddles, criss cross, CW and CCW circles   02/13/22 -Manual Therapy: myofascial release and soft tissue mobilization to upper trapezius and distal biceps  -P/ROM: 1x10 supine shoulder flexion, abduction, IR/er -AA/ROM: 1x10 Sullivan flexion, horizontal ab/adduction, protraction, abduction, IR/er -Wall Wash: 2x30 seconds forward flexion and abduction     GOALS: Goals reviewed with patient? Yes   SHORT TERM GOALS: Target date: 02/09/2022     Pt will be provided with and educated on HEP to improve mobility required for use of RUE during ADLs.   Goal status: MET   2.  Pt will increase RUE P/ROM to [facility] to improve ability to donn shirts with minimal  compensatory strategies.    Goal status: ONGOING   3.   Pt will increase RUE strength to 3/5 or greater to improve ability to reach for items at waist to head height.   Goal status: MET           LONG TERM GOALS: Target date: 03/09/2022     Pt will return to highest level of functioning using RUE as dominant during ADLs.    Goal status: ONGOING    2.  Pt will decrease pain in RUE to 3/10 or less to improve ability to sleep for 2+ hours without waking due to pain.    Goal status: ONGOING    3.  Pt will increase RUE A/ROM to [facility] to improve ability to reach overhead and behind back during dressing and bathing tasks.    Goal status: ONGOING    4.  Pt will increase RUE strength to 4+/5 or greater to improve ability to return to gym tasks and workouts.   Goal status: ONGOING    5.  Pt will decrease RUE fascial restrictions to min amounts or less to improve ability to perform functional reaching tasks during daily task completion.   Goal status: ONGOING       ASSESSMENT:   CLINICAL IMPRESSION: A:  Pt reports he used a weed eater both at ground level and over his head. Continued with manual techniques to address fascial restrictions today, passive stretching. Progressed to all A/ROM and updated HEP. Continued with scapular theraband, visual and verbal cuing for form. Added overhead lacing and UBE today. Verbal cuing for form and technique.     PLAN: OT FREQUENCY: 2x/week   OT DURATION: 8 weeks   PLANNED INTERVENTIONS: self care/ADL training, therapeutic exercise, therapeutic activity, manual therapy, passive range of motion, electrical stimulation, ultrasound, moist heat, cryotherapy, patient/family education, and DME and/or AE instructions     CONSULTED AND AGREED WITH PLAN OF CARE: Patient   PLAN FOR NEXT SESSION: Follow up on HEP, functional reaching task in the kitchen cabinets, add scapular theraband to HEP if pt completing with good form    Guadelupe Sabin, OTR/L   [phone] 02/22/2022, 11:13 AM

## 2022-02-22 NOTE — Patient Instructions (Signed)

## 2022-02-24 ENCOUNTER — Encounter (HOSPITAL_COMMUNITY): Payer: Self-pay | Admitting: Occupational Therapy

## 2022-02-24 ENCOUNTER — Ambulatory Visit (HOSPITAL_COMMUNITY): Payer: Medicare HMO | Admitting: Occupational Therapy

## 2022-02-24 DIAGNOSIS — M25511 Pain in right shoulder: Secondary | ICD-10-CM

## 2022-02-24 DIAGNOSIS — M25611 Stiffness of right shoulder, not elsewhere classified: Secondary | ICD-10-CM

## 2022-02-24 DIAGNOSIS — R29898 Other symptoms and signs involving the musculoskeletal system: Secondary | ICD-10-CM

## 2022-02-24 NOTE — Therapy (Addendum)
OUTPATIENT OCCUPATIONAL THERAPY TREATMENT NOTE   Patient Name: Stephen Sullivan MRN: [id number] DOB:[birth year], 66 y.o., male Today's Date: 02/24/2022  PCP: Dr. Sharlyne Cai  REFERRING PROVIDER: Dr. Larena Glassman      END OF SESSION:   OT End of Session - 02/24/22 1424     Visit Number 14    Number of Visits 16    Date for OT Re-Evaluation 03/12/22    Authorization Type [employer]    Progress Note Due on Visit 49    OT Start Time 1342    OT Stop Time 1428    OT Time Calculation (min) 46 min    Activity Tolerance Patient tolerated treatment well    Behavior During Therapy WFL for tasks assessed/performed                    Past Medical History:  Diagnosis Date   Arthritis    Back pain    Cataracts, bilateral    immature   GERD (gastroesophageal reflux disease)    using Baking Soda and Water per pt   Headache(784.0)    related to neck issues   History of bronchitis    History of gastric ulcer 20+yrs ago   Hypertension    takes Benazepril some days   Joint pain    Muscle spasm    takes Flexeril daily   Shortness of breath    with exertion   Weakness    and numbness in left fingers   Past Surgical History:  Procedure Laterality Date   ABDOMINAL AORTOGRAM W/LOWER EXTREMITY N/A 10/18/2020   Procedure: ABDOMINAL AORTOGRAM W/LOWER EXTREMITY;  Surgeon: Waynetta Sandy, MD;  Location: [place] [facility];  Service: Cardiovascular;  Laterality: N/A;   ANTERIOR CERVICAL DECOMP/DISCECTOMY FUSION N/A 12/18/2013   Procedure: ANTERIOR CERVICAL DECOMPRESSION/DISCECTOMY FUSION CERVICAL THREE-FOUR, FOUR-FIVE, FIVE-SIX W/ BONEGRAFT;  Surgeon: Hosie Spangle, MD;  Location: [place] [facility];  Service: Neurosurgery;  Laterality: N/A;  ANTERIOR CERVICAL DECOMPRESSION/DISCECTOMY FUSION CERVICAL THREE-FOUR, FOUR-FIVE, FIVE-SIX W/ BONEGRAFT   arm surgery Left    COLONOSCOPY WITH PROPOFOL N/A 11/15/2017   Procedure: COLONOSCOPY WITH PROPOFOL;  Surgeon: Daneil Dolin, MD;  [facility];  Service: Endoscopy;  Laterality: N/A;  12:30pm   ESOPHAGOGASTRODUODENOSCOPY (EGD) WITH PROPOFOL N/A 10/05/2016   Procedure: ESOPHAGOGASTRODUODENOSCOPY (EGD) WITH PROPOFOL;  Surgeon: Daneil Dolin, MD;  [facility];  Service: Endoscopy;  Laterality: N/A;   ESOPHAGOGASTRODUODENOSCOPY (EGD) WITH PROPOFOL N/A 02/15/2017   Procedure: ESOPHAGOGASTRODUODENOSCOPY (EGD) WITH PROPOFOL;  Surgeon: Daneil Dolin, MD;  [facility];  Service: Endoscopy;  Laterality: N/A;  [place] Right 12/22/2021   Procedure: HARDWARE REMOVAL;  Surgeon: Mordecai Rasmussen, MD;  [facility];  Service: Orthopedics;  Laterality: Right;   INGUINAL HERNIA REPAIR Right 08/18/2016   Procedure: RIGHT INGUINAL HERNORRHAPHY WITH MESH;  Surgeon: Aviva Signs, MD;  Location[facility];  Service: General;  Laterality: Right;   JOINT REPLACEMENT Right    hip    PERIPHERAL VASCULAR INTERVENTION Right 10/18/2020   Procedure: PERIPHERAL VASCULAR INTERVENTION;  Surgeon: Waynetta Sandy, MD;  Location: [place] [facility];  Service: Cardiovascular;  Laterality: Right;   POLYPECTOMY  11/15/2017   Procedure: POLYPECTOMY;  Surgeon: Daneil Dolin, MD;  [facility];  Service: Endoscopy;;  polyp cs splenic flexure times 2,  cecal polyp   REVERSE SHOULDER ARTHROPLASTY Right 12/22/2021   Procedure: REVERSE SHOULDER ARTHROPLASTY;  Surgeon: Amedeo Kinsman,  Jeannette How, MD;  Location: [facility];  Service: Orthopedics;  Laterality: Right;   SHOULDER SURGERY Right    x 3    Patient Active Problem List   Diagnosis Date Noted   Glenohumeral arthritis, right 11/03/2021   GERD (gastroesophageal reflux disease) 10/15/2017   Constipation 01/09/2017   Gastrointestinal hemorrhage    Acute gastric ulcer    Acute esophagitis    GI bleed 10/04/2016   Polysubstance abuse ([place]) 10/04/2016   Tobacco dependence 10/04/2016   H/O arthrodesis 10/13/2014   Cervical spondylosis  12/18/2013   Neck pain 11/25/2013   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 Pt had been experiencing right shoulder pain present since August 2022. Pt with a hx of right RCR approximately 40 year ago. Pt was referred to occupational therapy for evaluation and treatment by Dr. Larena Glassman.     PRECAUTIONS: shoulder, see protocol  SUBJECTIVE: S: I weed eated with a little electric weed eater and it felt good.   PAIN:  Are you having pain? No      OBJECTIVE:   FUNCTIONAL OUTCOME MEASURES: FOTO: 4/100 02/10/22 65.92   UPPER EXTREMITY ROM       Assessed supine, er/IR adducted Passive ROM Right eval Right 02/10/22  Shoulder flexion 80 128  Shoulder abduction 65 95  Shoulder internal rotation 90 90  Shoulder external rotation 30 38    Assessed supine, er/IR adducted AROM Right eval Right 02/10/22  Shoulder flexion  120  Shoulder abduction  90  Shoulder internal rotation  90  Shoulder external rotation  35     UPPER EXTREMITY MMT:      Unable to assess due to pain/precautions MMT Right eval Right  02/10/22  Shoulder flexion   3+  Shoulder abduction   3+  Shoulder internal rotation   3+  Shoulder external rotation   3+  (Blank rows = not tested)   PATIENT EDUCATION: Education details:Scapular theraband exercises  Person educated: Patient Education method: Explanation Education comprehension: verbalized understanding     HOME EXERCISE PROGRAM: Eval: table slides, 6/20: AA/ROM supine but without abduction at this time.  02/02/22:  Shoulder isometrics  02/08/22: Scapular ROM  7/19: A/ROM 7/21: scapular theraband exercises    TODAY'S TREATMENT:   02/24/22 - Manual therapy: myofascial release and soft tissue mobilization to distal biceps, pectoral region, upper trap to decrease pain and increase mobility   - P/ROM: supine, flexion, abduction, er/IR, horizontal abduction, 5 reps each  - A/ROM: supine, protraction, flexion, abduction, er/IR, horizontal abduction, 10 reps - Functional reaching task: taking 4 items off of top shelf all items off of middle and bottom shelf- then placing back on after 1 minute break   - Scapular theraband:row, extension, retraction, 10 reps each with red band   - UBE: level 1, 3' forward, 3' reverse, level 5, speed 17      02/22/22 -Manual Therapy: myofascial release and soft tissue mobilization to upper trapezius and distal biceps to decrease pain and fascial restrictions and increase joint ROM -P/ROM: supine, flexion, abduction, er/IR, horizontal abduction, 5 reps each -A/ROM:  supine, protraction, flexion, abduction, er/IR, horizontal abduction, 10 reps -A/ROM: standing, protraction, flexion, abduction, er/IR, horizontal abduction, 10 reps -Scapular theraband: red, row, extension, retraction, 10 reps each -Overhead lacing: seated, lacing from top down then reversing -UBE: level 1, 3' forward, 3' reverse, pace: 3.5  02/17/22 - -Manual Therapy: myofascial release and soft tissue mobilization to upper trapezius and distal biceps  -P/ROM: 1x10 supine shoulder flexion, abduction, IR/er -AA/ROM: 1x10 standing flexion, horizontal ab/adduction, protraction, abduction, IR/er - Wall stretches: standing facing wall walking hand up into shoulder flexion and holding for 30 seconds 2x, shoulder abduction 30 seconds 2x  - Functional reaching: sitting at table level, using RUE to squeeze and place clips on tree on top of elevated surface - Theraband: 1x10 red theraband, row, extension -Proximal Shoulder  Strengthening: 1x30" each movement, paddles, criss cross, CW and CCW circles      GOALS: Goals reviewed with patient? Yes   SHORT TERM GOALS: Target date: 02/09/2022     Pt will be provided with and educated on HEP to improve mobility required for use of RUE during ADLs.   Goal status: MET   2.  Pt will increase RUE P/ROM to [facility] to improve ability to donn shirts with minimal compensatory strategies.    Goal status: ONGOING   3.   Pt will increase RUE strength to 3/5 or greater to improve ability to reach for items at waist to head height.   Goal status: MET           LONG TERM GOALS: Target date: 03/09/2022     Pt will return to highest level of functioning using RUE as dominant during ADLs.    Goal status: ONGOING    2.  Pt will decrease pain in RUE to 3/10 or less to improve ability to sleep for 2+ hours without waking due to pain.    Goal status: ONGOING    3.  Pt will increase RUE A/ROM to [facility] to improve ability to reach overhead and behind back during dressing and bathing tasks.    Goal status: ONGOING    4.  Pt will increase RUE strength to 4+/5 or greater to improve ability to return to gym tasks and workouts.   Goal status: ONGOING    5.  Pt will decrease RUE fascial restrictions to min amounts or less to improve ability to perform functional reaching tasks during daily task completion.   Goal status: ONGOING       ASSESSMENT:   CLINICAL IMPRESSION: A:  Pt reports that he has been using his weed eater and it went well without pain. Completed P/ROM and progressed to A/ROM seated on edge of table. Targeted overhead reaching with taking items on and off shelf in ADL kitchen. Ended session on arm bike. Continued scapular theraband exercises and added to HEP.     PLAN: OT FREQUENCY: 2x/week   OT DURATION: 8 weeks   PLANNED INTERVENTIONS: self care/ADL training, therapeutic exercise, therapeutic activity, manual therapy, passive range of motion, electrical  stimulation, ultrasound, moist heat, cryotherapy, patient/family education, and DME and/or AE instructions     CONSULTED AND AGREED WITH PLAN OF CARE: Patient   PLAN FOR NEXT SESSION: Follow up on HEP, functional reaching activity, theraband exercises, lacing     Arvil Persons, OTR/L  [phone] 02/24/2022, 3:22 PM

## 2022-02-24 NOTE — Patient Instructions (Signed)

## 2022-02-27 ENCOUNTER — Encounter (HOSPITAL_COMMUNITY): Payer: Self-pay

## 2022-02-27 ENCOUNTER — Ambulatory Visit (HOSPITAL_COMMUNITY): Payer: Medicare HMO

## 2022-02-27 DIAGNOSIS — M25611 Stiffness of right shoulder, not elsewhere classified: Secondary | ICD-10-CM

## 2022-02-27 DIAGNOSIS — M25511 Pain in right shoulder: Secondary | ICD-10-CM | POA: Diagnosis not present

## 2022-02-27 DIAGNOSIS — R29898 Other symptoms and signs involving the musculoskeletal system: Secondary | ICD-10-CM

## 2022-02-27 NOTE — Therapy (Signed)
OUTPATIENT OCCUPATIONAL THERAPY TREATMENT NOTE   Patient Name: Stephen Sullivan MRN: [id number] DOB:1955/11/27, 66 y.o., male Today's Date: 02/27/2022  PCP: Dr. Sharlyne Cai  REFERRING PROVIDER: Dr. Larena Glassman      END OF SESSION:   OT End of Session - 02/27/22 1350     Visit Number 15    Number of Visits 16    Date for OT Re-Evaluation 03/12/22    Authorization Type [employer]    Progress Note Due on Visit 9    OT Start Time 1347    OT Stop Time 1425    OT Time Calculation (min) 38 min    Activity Tolerance Patient tolerated treatment well    Behavior During Therapy WFL for tasks assessed/performed                    Past Medical History:  Diagnosis Date   Arthritis    Back pain    Cataracts, bilateral    immature   GERD (gastroesophageal reflux disease)    using Baking Soda and Water per pt   Headache(784.0)    related to neck issues   History of bronchitis    History of gastric ulcer 20+yrs ago   Hypertension    takes Benazepril some days   Joint pain    Muscle spasm    takes Flexeril daily   Shortness of breath    with exertion   Weakness    and numbness in left fingers   Past Surgical History:  Procedure Laterality Date   ABDOMINAL AORTOGRAM W/LOWER EXTREMITY N/A 10/18/2020   Procedure: ABDOMINAL AORTOGRAM W/LOWER EXTREMITY;  Surgeon: Waynetta Sandy, MD;  Location: [place] [facility];  Service: Cardiovascular;  Laterality: N/A;   ANTERIOR CERVICAL DECOMP/DISCECTOMY FUSION N/A 12/18/2013   Procedure: ANTERIOR CERVICAL DECOMPRESSION/DISCECTOMY FUSION CERVICAL THREE-FOUR, FOUR-FIVE, FIVE-SIX W/ BONEGRAFT;  Surgeon: Hosie Spangle, MD;  Location: [place] [facility];  Service: Neurosurgery;  Laterality: N/A;  ANTERIOR CERVICAL DECOMPRESSION/DISCECTOMY FUSION CERVICAL THREE-FOUR, FOUR-FIVE, FIVE-SIX W/ BONEGRAFT   arm surgery Left    COLONOSCOPY WITH PROPOFOL N/A 11/15/2017   Procedure: COLONOSCOPY WITH PROPOFOL;  Surgeon: Daneil Dolin, MD;  [facility];  Service: Endoscopy;  Laterality: N/A;  12:30pm   ESOPHAGOGASTRODUODENOSCOPY (EGD) WITH PROPOFOL N/A 10/05/2016   Procedure: ESOPHAGOGASTRODUODENOSCOPY (EGD) WITH PROPOFOL;  Surgeon: Daneil Dolin, MD;  [facility];  Service: Endoscopy;  Laterality: N/A;   ESOPHAGOGASTRODUODENOSCOPY (EGD) WITH PROPOFOL N/A 02/15/2017   Procedure: ESOPHAGOGASTRODUODENOSCOPY (EGD) WITH PROPOFOL;  Surgeon: Daneil Dolin, MD;  [facility];  Service: Endoscopy;  Laterality: N/A;  [place] Right 12/22/2021   Procedure: HARDWARE REMOVAL;  Surgeon: Mordecai Rasmussen, MD;  Location: [facility];  Service: Orthopedics;  Laterality: Right;   INGUINAL HERNIA REPAIR Right 08/18/2016   Procedure: RIGHT INGUINAL HERNORRHAPHY WITH MESH;  Surgeon: Aviva Signs, MD;  Location: [facility];  Service: General;  Laterality: Right;   JOINT REPLACEMENT Right    hip    PERIPHERAL VASCULAR INTERVENTION Right 10/18/2020   Procedure: PERIPHERAL VASCULAR INTERVENTION;  Surgeon: Waynetta Sandy, MD;  Location: [place];  Service: Cardiovascular;  Laterality: Right;   POLYPECTOMY  11/15/2017   Procedure: POLYPECTOMY;  Surgeon: Daneil Dolin, MD;  [facility];  Service: Endoscopy;;  polyp cs splenic flexure times 2,  cecal polyp   REVERSE SHOULDER ARTHROPLASTY Right 12/22/2021   Procedure: REVERSE SHOULDER ARTHROPLASTY;  Surgeon: Amedeo Kinsman,  Jeannette How, MD;  Location: [facility];  Service: Orthopedics;  Laterality: Right;   SHOULDER SURGERY Right    x 3    Patient Active Problem List   Diagnosis Date Noted   Glenohumeral arthritis, right 11/03/2021   GERD (gastroesophageal reflux disease) 10/15/2017   Constipation 01/09/2017   Gastrointestinal hemorrhage    Acute gastric ulcer    Acute esophagitis    GI bleed 10/04/2016   Polysubstance abuse ([place]) 10/04/2016   Tobacco dependence 10/04/2016   H/O arthrodesis 10/13/2014   Cervical spondylosis  12/18/2013   Neck pain 11/25/2013   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 Pt had been experiencing right shoulder pain present since August 2022. Pt with a hx of right RCR approximately 40 year ago. Pt was referred to occupational therapy for evaluation and treatment by Dr. Larena Glassman.     PRECAUTIONS: shoulder, see protocol  SUBJECTIVE: S: "My shoulders having been dancing a little bit with those new exercises."  PAIN:  Are you having pain? No      OBJECTIVE:   FUNCTIONAL OUTCOME MEASURES: FOTO: 4/100 02/10/22 65.92   UPPER EXTREMITY ROM       Assessed supine, er/IR adducted Passive ROM Right eval Right 02/10/22  Shoulder flexion 80 128  Shoulder abduction 65 95  Shoulder internal rotation 90 90  Shoulder external rotation 30 38    Assessed supine, er/IR adducted AROM Right eval Right 02/10/22  Shoulder flexion  120  Shoulder abduction  90  Shoulder internal rotation  90  Shoulder external rotation  35     UPPER EXTREMITY MMT:      Unable to assess due to pain/precautions MMT Right eval Right  02/10/22  Shoulder flexion   3+  Shoulder abduction   3+  Shoulder internal rotation   3+  Shoulder external rotation   3+  (Blank rows = not tested)   PATIENT EDUCATION: Education details:Scapular theraband exercises  Person educated: Patient Education method: Explanation Education comprehension: verbalized understanding     HOME EXERCISE PROGRAM: Eval: table slides, 6/20: AA/ROM supine but without abduction at this time.   02/02/22: Shoulder isometrics  02/08/22: Scapular ROM  7/19: A/ROM 7/21: scapular theraband exercises    TODAY'S TREATMENT:  02/27/22 - Manual therapy: myofascial release and soft tissue mobilization to distal biceps, pectoral region, upper trap to decrease pain and increase mobility   - P/ROM: supine, flexion, abduction, er/IR, horizontal abduction, 5 reps each - AA/ROM: supine, protraction, flexion, abduction, er/IR, horizontal abduction, 10 reps  - A/ROM: supine, protraction, flexion, abduction, er/IR, horizontal abduction, 10 reps -Forward flexion lacing x1 -Scapular theraband:row, extension, retraction, 10 reps each with red band, 10 reps each with blue band  - UBE: level 1, 3' forward, 3' reverse, level 5, speed 17     02/24/22 - Manual therapy: myofascial release and soft tissue mobilization to distal biceps, pectoral region, upper trap to decrease pain and increase mobility   - P/ROM: supine, flexion, abduction, er/IR, horizontal abduction, 5 reps each  - A/ROM: supine, protraction, flexion, abduction, er/IR, horizontal abduction, 10 reps - Functional  reaching task: taking 4 items off of top shelf all items off of middle and bottom shelf- then placing back on after 1 minute break   - Scapular theraband:row, extension, retraction, 10 reps each with red band   - UBE: level 1, 3' forward, 3' reverse, level 5, speed 17      02/22/22 -Manual Therapy: myofascial release and soft tissue mobilization to upper trapezius and distal biceps to decrease pain and fascial restrictions and increase joint ROM -P/ROM: supine, flexion, abduction, er/IR, horizontal abduction, 5 reps each -A/ROM: supine, protraction, flexion, abduction, er/IR, horizontal abduction, 10 reps -A/ROM: standing, protraction, flexion, abduction, er/IR, horizontal abduction, 10 reps -Scapular theraband: red, row, extension, retraction, 10 reps each -Overhead lacing: seated, lacing from top down then reversing -UBE: level  1, 3' forward, 3' reverse, pace: 3.5   GOALS: Goals reviewed with patient? Yes   SHORT TERM GOALS: Target date: 02/09/2022     Pt will be provided with and educated on HEP to improve mobility required for use of RUE during ADLs.   Goal status: MET   2.  Pt will increase RUE P/ROM to [facility] to improve ability to donn shirts with minimal compensatory strategies.    Goal status: ONGOING   3.   Pt will increase RUE strength to 3/5 or greater to improve ability to reach for items at waist to head height.   Goal status: MET           LONG TERM GOALS: Target date: 03/09/2022     Pt will return to highest level of functioning using RUE as dominant during ADLs.    Goal status: ONGOING    2.  Pt will decrease pain in RUE to 3/10 or less to improve ability to sleep for 2+ hours without waking due to pain.    Goal status: ONGOING    3.  Pt will increase RUE A/ROM to [facility] to improve ability to reach overhead and behind back during dressing and bathing tasks.    Goal status: ONGOING    4.  Pt will increase RUE strength to 4+/5 or greater to improve ability to return to gym tasks and workouts.   Goal status: ONGOING    5.  Pt will decrease RUE fascial restrictions to min amounts or less to improve ability to perform functional reaching tasks during daily task completion.   Goal status: ONGOING       ASSESSMENT:   CLINICAL IMPRESSION: A:  Pt reports that he has been having some tightness in his shoulder but that the new HEP exercises are going well. Fair endurance noted with lacing, pt reporting some tightness and requiring rest break and benefiting from brief trigger point work at upper trapezius. Minimal tactile cues required for form with theraband scapular strengthening, able to progress to blue. Pt reports that UBE arm bike "warmed up my shoulder. I wont need a heating pad."    PLAN: OT FREQUENCY: 2x/week   OT DURATION: 8 weeks   PLANNED INTERVENTIONS: self care/ADL training,  therapeutic exercise, therapeutic activity, manual therapy, passive range of motion, electrical stimulation, ultrasound, moist heat, cryotherapy, patient/family education, and DME and/or AE instructions     CONSULTED AND AGREED WITH PLAN OF CARE: Patient   PLAN FOR NEXT SESSION: Follow up on HEP, functional reaching activity, theraband exercises, lacing and [address] OTD, OTR/L [phone]  02/27/2022, 2:22 PM

## 2022-02-28 ENCOUNTER — Encounter: Payer: Self-pay | Admitting: Orthopedic Surgery

## 2022-02-28 ENCOUNTER — Ambulatory Visit (INDEPENDENT_AMBULATORY_CARE_PROVIDER_SITE_OTHER): Payer: Medicare HMO

## 2022-02-28 ENCOUNTER — Ambulatory Visit: Payer: Medicare HMO | Admitting: Orthopedic Surgery

## 2022-02-28 VITALS — Ht 70.0 in | Wt 167.0 lb

## 2022-02-28 DIAGNOSIS — M79652 Pain in left thigh: Secondary | ICD-10-CM

## 2022-02-28 DIAGNOSIS — M5416 Radiculopathy, lumbar region: Secondary | ICD-10-CM

## 2022-02-28 NOTE — Patient Instructions (Signed)
We will have you start some PT for your lower back  Follow up as previously scheduled for your right shoulder.

## 2022-03-01 ENCOUNTER — Encounter (HOSPITAL_COMMUNITY): Payer: Medicare HMO | Admitting: Occupational Therapy

## 2022-03-01 NOTE — Progress Notes (Signed)
Orthopaedic Postop Note  Assessment: Stephen Sullivan is a 66 y.o. male s/p Right Reverse Shoulder Arthroplasty (DOS: 12/22/2021)  Left-sided lumbar radiculopathy    Plan: Mr. Haig right shoulder is doing very well.  He is now complaining of pain in in the left leg, with burning and tingling sensations into his thigh.  Radiographs are without acute injuries, or anterolisthesis.  At this point, I recommended physical therapy.  If he does not improved with physical therapy, we will likely discuss obtaining an MRI.  Continue with his medications as needed.  Unfortunately, he does not tolerate gabapentin.  Continue with physical therapy for his right shoulder as well.  Follow-up: Return for previously scheduled appointment .  XR at next visit: Left shoulder  Subjective:  Chief Complaint  Patient presents with   Leg Pain    Left upper thigh burning pain that's gotten worse over the past 2 weeks. No pain in lower back or pain going into the feet.     History of Present Illness: Stephen Sullivan is a 66 y.o. male who presents following the above stated procedure.  Shoulder surgery was a couple months ago.  He reports no issues with his right shoulder.  He is pleased with his progress.  He continues to have pain in the left leg.  He has burning sensations in the left thigh, and this is progressively worsening.  No specific injury.  We have previously tried gabapentin, but he did not tolerate this.  His pain gets worse when he stands up for longer periods of time.    Review of Systems: No fevers or chills No numbness or tingling No Chest Pain No shortness of breath   Objective: Ht '5\' 10"'$  (1.778 m)   Wt 167 lb (75.8 kg)   BMI 23.96 kg/m   Physical Exam:  Alert and oriented.  No acute distress.   No pain in the lower back.  Negative straight leg raise.  Slightly decreased sensation in the anterior lateral aspect of the left thigh.  Bilateral lower extremity strength is good.   1+ patellar tendon reflexes.   IMAGING: I personally ordered and reviewed the following images:  Standing lumbar spine x-rays were obtained in clinic today.  No acute injuries are noted.  No evidence of anterolisthesis.  He has some loss of disc height at L3-4 L4-5 and L5-S1.  Anterior osteophytes are noted.  Progressively developing bridging osteophytes at multiple levels.  Impression: Lumbar spine x-rays with degenerative changes.   Stephen Rasmussen, MD 03/01/2022 9:59 AM

## 2022-03-03 ENCOUNTER — Ambulatory Visit (HOSPITAL_COMMUNITY): Payer: Medicare HMO | Admitting: Occupational Therapy

## 2022-03-03 ENCOUNTER — Encounter (HOSPITAL_COMMUNITY): Payer: Self-pay | Admitting: Occupational Therapy

## 2022-03-03 DIAGNOSIS — R29898 Other symptoms and signs involving the musculoskeletal system: Secondary | ICD-10-CM

## 2022-03-03 DIAGNOSIS — M25611 Stiffness of right shoulder, not elsewhere classified: Secondary | ICD-10-CM

## 2022-03-03 DIAGNOSIS — M25511 Pain in right shoulder: Secondary | ICD-10-CM

## 2022-03-03 NOTE — Therapy (Signed)
OUTPATIENT OCCUPATIONAL THERAPY REASSESSMENT, TREATMENT, DISCHARGE SUMMARY NOTE   Patient Name: Stephen Sullivan MRN: [id number] DOB:02/16/1956, 66 y.o., male Today's Date: 03/03/2022  PCP: Dr. Sharlyne Cai  REFERRING PROVIDER: Dr. Larena Glassman      END OF SESSION:   OT End of Session - 03/03/22 1421     Visit Number 16    Number of Visits 16    Date for OT Re-Evaluation 03/12/22    Authorization Type [employer] Medicare    Progress Note Due on Visit 86    OT Start Time 1349    OT Stop Time 1421    OT Time Calculation (min) 32 min    Activity Tolerance Patient tolerated treatment well    Behavior During Therapy WFL for tasks assessed/performed                     Past Medical History:  Diagnosis Date   Arthritis    Back pain    Cataracts, bilateral    immature   GERD (gastroesophageal reflux disease)    using Baking Soda and Water per pt   Headache(784.0)    related to neck issues   History of bronchitis    History of gastric ulcer 20+yrs ago   Hypertension    takes Benazepril some days   Joint pain    Muscle spasm    takes Flexeril daily   Shortness of breath    with exertion   Weakness    and numbness in left fingers   Past Surgical History:  Procedure Laterality Date   ABDOMINAL AORTOGRAM W/LOWER EXTREMITY N/A 10/18/2020   Procedure: ABDOMINAL AORTOGRAM W/LOWER EXTREMITY;  Surgeon: Waynetta Sandy, MD;  Location: [place] [facility];  Service: Cardiovascular;  Laterality: N/A;   ANTERIOR CERVICAL DECOMP/DISCECTOMY FUSION N/A 12/18/2013   Procedure: ANTERIOR CERVICAL DECOMPRESSION/DISCECTOMY FUSION CERVICAL THREE-FOUR, FOUR-FIVE, FIVE-SIX W/ BONEGRAFT;  Surgeon: Hosie Spangle, MD;  Location: [place] [facility];  Service: Neurosurgery;  Laterality: N/A;  ANTERIOR CERVICAL DECOMPRESSION/DISCECTOMY FUSION CERVICAL THREE-FOUR, FOUR-FIVE, FIVE-SIX W/ BONEGRAFT   arm surgery Left    COLONOSCOPY WITH PROPOFOL N/A 11/15/2017   Procedure: COLONOSCOPY  WITH PROPOFOL;  Surgeon: Daneil Dolin, MD;  [facility];  Service: Endoscopy;  Laterality: N/A;  12:30pm   ESOPHAGOGASTRODUODENOSCOPY (EGD) WITH PROPOFOL N/A 10/05/2016   Procedure: ESOPHAGOGASTRODUODENOSCOPY (EGD) WITH PROPOFOL;  Surgeon: Daneil Dolin, MD;  [facility];  Service: Endoscopy;  Laterality: N/A;   ESOPHAGOGASTRODUODENOSCOPY (EGD) WITH PROPOFOL N/A 02/15/2017   Procedure: ESOPHAGOGASTRODUODENOSCOPY (EGD) WITH PROPOFOL;  Surgeon: Daneil Dolin, MD;  [facility];  Service: Endoscopy;  Laterality: N/A;  [place] Right 12/22/2021   Procedure: HARDWARE REMOVAL;  Surgeon: Mordecai Rasmussen, MD;  [facility]: [facility];  Service: Orthopedics;  Laterality: Right;   INGUINAL HERNIA REPAIR Right 08/18/2016   Procedure: RIGHT INGUINAL HERNORRHAPHY WITH MESH;  Surgeon: Aviva Signs, MD;  Location: [facility];  Service: General;  Laterality: Right;   JOINT REPLACEMENT Right    hip    PERIPHERAL VASCULAR INTERVENTION Right 10/18/2020   Procedure: PERIPHERAL VASCULAR INTERVENTION;  Surgeon: Waynetta Sandy, MD;  Location: [place] [facility];  Service: Cardiovascular;  Laterality: Right;   POLYPECTOMY  11/15/2017   Procedure: POLYPECTOMY;  Surgeon: Daneil Dolin, MD;  [facility];  Service: Endoscopy;;  polyp cs splenic flexure times 2,  cecal polyp   REVERSE SHOULDER ARTHROPLASTY Right 12/22/2021   Procedure: REVERSE SHOULDER  ARTHROPLASTY;  Surgeon: Mordecai Rasmussen, MD;  Location: [facility];  Service: Orthopedics;  Laterality: Right;   SHOULDER SURGERY Right    x 3    Patient Active Problem List   Diagnosis Date Noted   Glenohumeral arthritis, right 11/03/2021   GERD (gastroesophageal reflux disease) 10/15/2017   Constipation 01/09/2017   Gastrointestinal hemorrhage    Acute gastric ulcer    Acute esophagitis    GI bleed 10/04/2016   Polysubstance abuse ([place]) 10/04/2016   Tobacco dependence 10/04/2016   H/O arthrodesis 10/13/2014    Cervical spondylosis 12/18/2013   Neck pain 11/25/2013   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 Pt had been experiencing right shoulder pain present since August 2022. Pt with a hx of right RCR approximately 40 year ago. Pt was referred to occupational therapy for evaluation and treatment by Dr. Larena Glassman.     PRECAUTIONS: shoulder, see protocol  SUBJECTIVE: S: "It's great"  PAIN:  Are you having pain? No      OBJECTIVE:   FUNCTIONAL OUTCOME MEASURES: FOTO: 4/100 02/10/22 65.92 03/03/22: 60/100   UPPER EXTREMITY ROM       Assessed supine, er/IR adducted Passive ROM Right eval Right 02/10/22 Right 03/03/22  Shoulder flexion 80 128 135  Shoulder abduction 65 95 126  Shoulder internal rotation 90 90 90  Shoulder external rotation 30 38 45    Assessed seated, er/IR adducted AROM Right eval Right 02/10/22 Right 03/03/22  Shoulder flexion  120 120  Shoulder abduction  90 95  Shoulder internal rotation  90 90  Shoulder external rotation  35 30     UPPER EXTREMITY MMT:      Unable to assess due to pain/precautions MMT Right eval Right  02/10/22 Right  03/03/22  Shoulder flexion   3+ 5/5  Shoulder abduction   3+ 4/5  Shoulder internal rotation   3+ 5/5  Shoulder external rotation   3+ 5/5  (Blank rows = not tested)   PATIENT EDUCATION: Education details: red theraband strengthening Person educated: Patient Education method: Explanation Education comprehension: verbalized understanding     HOME EXERCISE PROGRAM: Eval: table  slides, 6/20: AA/ROM supine but without abduction at this time.  02/02/22: Shoulder isometrics  02/08/22: Scapular ROM  7/19: A/ROM 7/21: scapular theraband exercises 7/28: red theraband strengthening    TODAY'S TREATMENT:  03/03/22 -P/ROM: supine, flexion, abduction, er/IR, horizontal abduction, 5 reps each -A/ROM: standing, protraction, flexion, horizontal abduction, abduction, er/IR, 10 reps each -Theraband strengthening: green, protraction, flexion, er/IR, horizontal abduction, abduction, 10 reps each -Ball on Wall: 1' flexion, 1' abduction   02/27/22 - Manual therapy: myofascial release and soft tissue mobilization to distal biceps, pectoral region, upper trap to decrease pain and increase mobility   - P/ROM: supine, flexion, abduction, er/IR, horizontal abduction, 5 reps each - AA/ROM: supine, protraction, flexion, abduction, er/IR, horizontal abduction, 10 reps  - A/ROM: supine, protraction, flexion, abduction, er/IR, horizontal abduction, 10 reps -Forward flexion lacing x1 -Scapular theraband:row, extension, retraction, 10 reps each with red band, 10 reps each with blue band  - UBE: level 1,  3' forward, 3' reverse, level 5, speed 17     02/24/22 - Manual therapy: myofascial release and soft tissue mobilization to distal biceps, pectoral region, upper trap to decrease pain and increase mobility   - P/ROM: supine, flexion, abduction, er/IR, horizontal abduction, 5 reps each  - A/ROM: supine, protraction, flexion, abduction, er/IR, horizontal abduction, 10 reps - Functional reaching task: taking 4 items off of top shelf all items off of middle and bottom shelf- then placing back on after 1 minute break   - Scapular theraband:row, extension, retraction, 10 reps each with red band   - UBE: level 1, 3' forward, 3' reverse, level 5, speed 17     GOALS: Goals reviewed with patient? Yes   SHORT TERM GOALS: Target date: 02/09/2022     Pt will be provided with and educated on HEP to  improve mobility required for use of RUE during ADLs.   Goal status: MET   2.  Pt will increase RUE P/ROM to [facility] to improve ability to donn shirts with minimal compensatory strategies.    Goal status: PARTIALLY MET   3.   Pt will increase RUE strength to 3/5 or greater to improve ability to reach for items at waist to head height.   Goal status: MET           LONG TERM GOALS: Target date: 03/09/2022     Pt will return to highest level of functioning using RUE as dominant during ADLs.    Goal status: MET    2.  Pt will decrease pain in RUE to 3/10 or less to improve ability to sleep for 2+ hours without waking due to pain.    Goal status: MET   3.  Pt will increase RUE A/ROM to [facility] to improve ability to reach overhead and behind back during dressing and bathing tasks.    Goal status: PARTIALLY MET    4.  Pt will increase RUE strength to 4+/5 or greater to improve ability to return to gym tasks and workouts.   Goal status: PARTIALLY MET   5.  Pt will decrease RUE fascial restrictions to min amounts or less to improve ability to perform functional reaching tasks during daily task completion.   Goal status: MET       ASSESSMENT:   CLINICAL IMPRESSION: A:  Pt reports that he been up all night with the hiccups. He has been completing his HEP 5x/day. Reassessment completed this session, pt has met 2/5 STGs and 3/5 LTGs with all remaining goals partially met. Continued with A/ROM and progressed to green theraband strengthening today. Pt reports he can do everything at home including cooking, he just cannot lift weights. Pt is agreeable to discharge today with HEP, updated for green theraband strengthening.     PLAN: OT FREQUENCY: 2x/week   OT DURATION: 8 weeks   PLANNED INTERVENTIONS: self care/ADL training, therapeutic exercise, therapeutic activity, manual therapy, passive range of motion, electrical stimulation, ultrasound, moist heat, cryotherapy, patient/family  education, and DME and/or AE instructions     CONSULTED AND AGREED WITH PLAN OF CARE: Patient   PLAN FOR NEXT SESSION: DISCHARGE     Guadelupe Sabin, OTR/L  [phone]  03/03/2022, 2:22 PM       OCCUPATIONAL THERAPY DISCHARGE SUMMARY  Visits from Start of Care: 16  Current functional level related to goals / functional outcomes: See above. Pt requesting to discharge, pleased with functioning. Has met or partially met all goals, is using RUE for all  ADLs.    Remaining deficits: Decreased ROM   Education / Equipment: HEP for ROM and strengthening   Patient agrees to discharge. Patient goals were met. Patient is being discharged due to being pleased with the current functional level.Marland Kitchen

## 2022-03-03 NOTE — Patient Instructions (Signed)

## 2022-03-10 ENCOUNTER — Encounter (HOSPITAL_COMMUNITY): Payer: Medicare HMO | Admitting: Occupational Therapy

## 2022-03-21 ENCOUNTER — Encounter: Payer: Self-pay | Admitting: Orthopedic Surgery

## 2022-03-21 ENCOUNTER — Ambulatory Visit (INDEPENDENT_AMBULATORY_CARE_PROVIDER_SITE_OTHER): Payer: Medicare HMO

## 2022-03-21 ENCOUNTER — Ambulatory Visit (INDEPENDENT_AMBULATORY_CARE_PROVIDER_SITE_OTHER): Payer: Medicare HMO | Admitting: Orthopedic Surgery

## 2022-03-21 VITALS — Ht 70.0 in | Wt 167.0 lb

## 2022-03-21 DIAGNOSIS — Z96611 Presence of right artificial shoulder joint: Secondary | ICD-10-CM

## 2022-03-21 NOTE — Progress Notes (Signed)
Orthopaedic Postop Note  Assessment: ONIEL MELESKI is a 66 y.o. male s/p Right Reverse Shoulder Arthroplasty (DOS: 12/22/2021)  Left-sided lumbar radiculopathy  Plan: Right shoulder is doing well.  Continue to work on his exercises.  Gradual return to his previous level of function.  Encouraged him to attend physical therapy for his lower back.  Pending the results and efficacy of therapy, we may have to consider obtaining an MRI.  This was discussed with the patient, and all questions were answered.  Continue with medications as needed.  Follow-up in 3 months for his right shoulder.  He will let us know how he is doing in regards to his lower back.  Follow-up: Return in about 3 months (around 06/21/2022).  XR at next visit: Left shoulder  Subjective:  Chief Complaint  Patient presents with   Routine Post Op    Rt shoulder DOS 12/22/21    History of Present Illness: JAYZIAH BANKHEAD is a 66 y.o. male who presents following the above stated procedure.  Shoulder surgery was a 3 months ago.  He is doing well, in regards to his right shoulder.  He is doing exercises on his own.  No issues with the surgical incision.  He is very pleased with his progress regarding his right shoulder.  He does continue to have pain in the left leg.  He denies pain in his lower back.  He attempted to initiate physical therapy for his lower back, but was scheduled with an occupational therapist.  He is scheduled to start therapy on his back within the next 1-2 weeks.    Review of Systems: No fevers or chills No numbness or tingling No Chest Pain No shortness of breath   Objective: Ht '5\' 10"'$  (1.778 m)   Wt 167 lb (75.8 kg)   BMI 23.96 kg/m   Physical Exam:  Alert and oriented.  No acute distress.  Right shoulder surgical incision is healing well.  No surrounding erythema or drainage.  No tenderness to palpation.  Active forward flexion to 130 degrees.  Internal rotation of his lumbar spine.  30  degrees of external rotation at his side.  IMAGING: I personally ordered and reviewed the following images:  X-rays of the right shoulder were obtained in clinic today.  These were compared to prior x-rays.  Prostheses remain in good position.  No evidence of subsidence or failure.  Glenohumeral joint is reduced.  There are no lucencies around the prostheses.  Impression: Right reverse shoulder arthroplasty in stable alignment.   Mordecai Rasmussen, MD 03/21/2022 10:52 PM

## 2022-04-04 NOTE — Therapy (Signed)
OUTPATIENT PHYSICAL THERAPY THORACOLUMBAR EVALUATION   Patient Name: Stephen Sullivan MRN: [id number] DOB:1956/08/06, 66 y.o., male Today's Date: 04/05/2022   PT End of Session - 04/05/22 0942     Visit Number 1    Authorization Type [employer] HMO; no deduc, no VL, no auth    Progress Note Due on Visit 10    PT Start Time 0945    PT Stop Time 1025    PT Time Calculation (min) 40 min             Past Medical History:  Diagnosis Date   Arthritis    Back pain    Cataracts, bilateral    immature   GERD (gastroesophageal reflux disease)    using Baking Soda and Water per pt   Headache(784.0)    related to neck issues   History of bronchitis    History of gastric ulcer 20+yrs ago   Hypertension    takes Benazepril some days   Joint pain    Muscle spasm    takes Flexeril daily   Shortness of breath    with exertion   Weakness    and numbness in left fingers   Past Surgical History:  Procedure Laterality Date   ABDOMINAL AORTOGRAM W/LOWER EXTREMITY N/A 10/18/2020   Procedure: ABDOMINAL AORTOGRAM W/LOWER EXTREMITY;  Surgeon: Waynetta Sandy, MD;  Location: [place] [facility];  Service: Cardiovascular;  Laterality: N/A;   ANTERIOR CERVICAL DECOMP/DISCECTOMY FUSION N/A 12/18/2013   Procedure: ANTERIOR CERVICAL DECOMPRESSION/DISCECTOMY FUSION CERVICAL THREE-FOUR, FOUR-FIVE, FIVE-SIX W/ BONEGRAFT;  Surgeon: Hosie Spangle, MD;  Location: [place];  Service: Neurosurgery;  Laterality: N/A;  ANTERIOR CERVICAL DECOMPRESSION/DISCECTOMY FUSION CERVICAL THREE-FOUR, FOUR-FIVE, FIVE-SIX W/ BONEGRAFT   arm surgery Left    COLONOSCOPY WITH PROPOFOL N/A 11/15/2017   Procedure: COLONOSCOPY WITH PROPOFOL;  Surgeon: Daneil Dolin, MD;  [facility];  Service: Endoscopy;  Laterality: N/A;  12:30pm   ESOPHAGOGASTRODUODENOSCOPY (EGD) WITH PROPOFOL N/A 10/05/2016   Procedure: ESOPHAGOGASTRODUODENOSCOPY (EGD) WITH PROPOFOL;  Surgeon: Daneil Dolin, MD;  [facility];  Service: Endoscopy;  Laterality: N/A;   ESOPHAGOGASTRODUODENOSCOPY (EGD) WITH PROPOFOL N/A 02/15/2017   Procedure: ESOPHAGOGASTRODUODENOSCOPY (EGD) WITH PROPOFOL;  Surgeon: Daneil Dolin, MD;  [facility];  Service: Endoscopy;  Laterality: N/A;  [place] Right 12/22/2021   Procedure: HARDWARE REMOVAL;  Surgeon: Mordecai Rasmussen, MD;  Location: [facility];  Service: Orthopedics;  Laterality: Right;   INGUINAL HERNIA REPAIR Right 08/18/2016   Procedure: RIGHT INGUINAL HERNORRHAPHY WITH MESH;  Surgeon: Aviva Signs, MD;  Location: [facility];  Service: General;  Laterality: Right;   JOINT REPLACEMENT Right    hip    PERIPHERAL VASCULAR INTERVENTION Right 10/18/2020   Procedure: PERIPHERAL VASCULAR INTERVENTION;  Surgeon: Waynetta Sandy, MD;  Location: [place] [facility];  Service: Cardiovascular;  Laterality: Right;   POLYPECTOMY  11/15/2017   Procedure: POLYPECTOMY;  Surgeon: Daneil Dolin, MD;  [facility];  Service: Endoscopy;;  polyp cs splenic flexure times 2,  cecal polyp   REVERSE SHOULDER ARTHROPLASTY Right 12/22/2021   Procedure: REVERSE SHOULDER ARTHROPLASTY;  Surgeon: Mordecai Rasmussen, MD;  Location: [facility];  Service: Orthopedics;  Laterality: Right;   SHOULDER SURGERY Right    x 3    Patient Active Problem List   Diagnosis Date Noted   Glenohumeral arthritis, right 11/03/2021   GERD (gastroesophageal reflux disease) 10/15/2017   Constipation 01/09/2017  Gastrointestinal hemorrhage    Acute gastric ulcer    Acute esophagitis    GI bleed 10/04/2016   Polysubstance abuse ([place]) 10/04/2016   Tobacco dependence 10/04/2016   H/O arthrodesis 10/13/2014   Cervical spondylosis 12/18/2013   Neck pain 11/25/2013   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 11/25/2013   Adjustment disorder 12/20/2011    PCP: Adaline Sill, NP  REFERRING PROVIDER: Mordecai Rasmussen, MD [place] DIAG: [phone] (ICD-10-CM) - Lumbar radiculopathy   Rationale for Evaluation and Treatment Rehabilitation  THERAPY DIAG:  Low back pain, unspecified back pain laterality, unspecified chronicity, unspecified whether sciatica present  Radiculopathy, lumbar region  Pain in left leg  ONSET DATE: April 2023  SUBJECTIVE:                                                                                                                                                                                           SUBJECTIVE STATEMENT: Couldn't get out of bed for a couple days in April.  Pain afterwards into the left leg. Constant pain. Very specific area left anterior thigh; hip to anterior knee. Vascular surgeon stated it was not vascular in nature "You won't lose your leg" PERTINENT HISTORY:  Recent rTSA Right shoulder 12/22/21 Vascular surgery right leg 2022  PAIN:  Are you having pain? Yes: NPRS scale: 4/10 Pain location: left leg Pain description: burning Aggravating factors: putting pressure on leg; crossing ankles or legs, standing, walking Relieving factors: unknown   PRECAUTIONS: None  WEIGHT BEARING RESTRICTIONS No  FALLS:  Has patient fallen in last 6 months? No  LIVING ENVIRONMENT: Lives with: lives alone but family close by Lives in: House/apartment Stairs: Yes: External: 3 steps; on right going up, on left going up, and can reach both Has following equipment at home: Single point cane, Walker - 2 wheeled, shower chair, and bed side commode  OCCUPATION: disability for 15-20 years  PLOF: Independent  PATIENT GOALS want to get an MRI   OBJECTIVE:   DIAGNOSTIC FINDINGS:  Standing lumbar spine x-rays were obtained in clinic today.  No acute injuries are noted.  No evidence of anterolisthesis.  He has some loss of disc height at L3-4 L4-5 and L5-S1.  Anterior osteophytes are noted.  Progressively developing bridging osteophytes at multiple levels.    Impression: Lumbar spine x-rays with degenerative changes    PATIENT SURVEYS:  FOTO 62   COGNITION:  Overall cognitive status: Within functional limits for tasks assessed     SENSATION: WFL   PALPATION: Minimal tenderness on palpation  LUMBAR ROM:  Active  AROM  eval  Flexion 80%  Extension 50%; making rigtht leg burn  Right lateral flexion   Left lateral flexion   Right rotation   Left rotation    (Blank rows = not tested)  LOWER EXTREMITY MMT     Active  Right eval Left eval  Hip flexion 5 4+  Hip extension 4- 4-  Hip abduction    Hip adduction    Hip internal rotation    Hip external rotation    Knee flexion    Knee extension 5 4  Ankle dorsiflexion 5 5  Ankle plantarflexion    Ankle inversion    Ankle eversion     (Blank rows = not tested)   FUNCTIONAL TESTS:  5 times sit to stand: 16 sec 2 minute walk test: 399 ft  GAIT: Distance walked: 399 ft Assistive device utilized: None Level of assistance: Complete Independence Comments: reports increased burning in the left leg at the end of walk    TODAY'S TREATMENT  Physical therapy evaluation and HEP instruction   PATIENT EDUCATION:  Education details: Patient educated on exam findings, POC, scope of PT, [id number]. Person educated: Patient Education method: Explanation, Demonstration, and Handouts Education comprehension: verbalized understanding, returned demonstration, verbal cues required, and tactile cues required  HOME EXERCISE PROGRAM: Access Code: [email or web address] URL: [email or web address] Date: 04/05/2022 Prepared by: [facility]  Exercises - Supine Bridge  - 2 x daily - 7 x weekly - 1 sets - 10 reps - Supine Lower Trunk Rotation  - 2 x daily - 7 x weekly - 1 sets - 10 reps - Lying Prone  - 2 x daily - 7 x weekly - 1 reps - 5 min hold - Supine Transversus Abdominis Bracing - Hands on Stomach  - 2 x daily - 7 x weekly - 1 sets - 10 reps - Clamshell  - 2 x daily - 7 x weekly  - 1 sets - 10 reps   ASSESSMENT:  CLINICAL IMPRESSION: Patient is a 66 y.o. male who was seen today for physical therapy evaluation and treatment for M54.16 (ICD-10-CM) - Lumbar radiculopathy . Patient presents with weakness left lower extremity, decreased perceived functional mobility per FOTO score, limited lumbar mobility and burning type pain left leg that negatively impacts his ability to sleep, walking for grocery shopping and fitness and tolerance for any prolonged standing or walking. Patient will benefit from skilled therapy interventions to address deficits and promote optimal function.    OBJECTIVE IMPAIRMENTS Abnormal gait, decreased activity tolerance, decreased endurance, decreased knowledge of condition, decreased mobility, difficulty walking, decreased ROM, hypomobility, increased fascial restrictions, impaired perceived functional ability, increased muscle spasms, impaired flexibility, and pain.   ACTIVITY LIMITATIONS carrying, lifting, bending, sitting, standing, squatting, sleeping, stairs, locomotion level, and caring for others  PARTICIPATION LIMITATIONS: meal prep, cleaning, shopping, and community activity    REHAB POTENTIAL: Good  CLINICAL DECISION MAKING: Evolving/moderate complexity  EVALUATION COMPLEXITY: Moderate   GOALS: Goals reviewed with patient? No  SHORT TERM GOALS: Target date:  04/05/22  Patient will be independent with initial HEP Baseline: Goal status: met  2.  Patient will verbalize understanding of exam findings Baseline:  Goal status: met   PLAN: PT FREQUENCY: 1x/week  PT DURATION: 1 week  PLANNED INTERVENTIONS: Therapeutic exercises, Therapeutic activity, Neuromuscular re-education, Balance training, Gait training, Patient/Family education, Joint manipulation, Joint mobilization, Stair training, Orthotic/Fit training, DME instructions, Aquatic Therapy, Dry Needling, Electrical stimulation, Spinal manipulation, Spinal mobilization,  Cryotherapy, Moist heat, Compression bandaging,  scar mobilization, Splintting, Taping, Traction, Ultrasound, Ionotophoresis 89m/ml Dexamethasone, and Manual therapy   PLAN FOR NEXT SESSION: One time visit only; patient request HEP   10:39 AM, 04/05/22 Monta Police Small Jared Cahn MPT [facility] [place] #[phone]

## 2022-04-05 ENCOUNTER — Ambulatory Visit (HOSPITAL_COMMUNITY): Payer: Medicare HMO | Attending: Orthopedic Surgery

## 2022-04-05 DIAGNOSIS — M545 Low back pain, unspecified: Secondary | ICD-10-CM | POA: Insufficient documentation

## 2022-04-05 DIAGNOSIS — M5416 Radiculopathy, lumbar region: Secondary | ICD-10-CM | POA: Insufficient documentation

## 2022-04-05 DIAGNOSIS — M79605 Pain in left leg: Secondary | ICD-10-CM | POA: Insufficient documentation

## 2022-06-23 ENCOUNTER — Ambulatory Visit: Payer: Medicare HMO | Admitting: Orthopedic Surgery

## 2022-06-27 ENCOUNTER — Ambulatory Visit (INDEPENDENT_AMBULATORY_CARE_PROVIDER_SITE_OTHER): Payer: Medicare HMO

## 2022-06-27 ENCOUNTER — Encounter: Payer: Self-pay | Admitting: Orthopedic Surgery

## 2022-06-27 ENCOUNTER — Ambulatory Visit (INDEPENDENT_AMBULATORY_CARE_PROVIDER_SITE_OTHER): Payer: Medicare HMO | Admitting: Orthopedic Surgery

## 2022-06-27 VITALS — Ht 70.0 in | Wt 167.0 lb

## 2022-06-27 DIAGNOSIS — Z96611 Presence of right artificial shoulder joint: Secondary | ICD-10-CM

## 2022-06-27 NOTE — Progress Notes (Signed)
Orthopaedic Postop Note  Assessment: Stephen Sullivan is a 66 y.o. male s/p Right Reverse Shoulder Arthroplasty (DOS: 12/22/2021)  Left-sided lumbar radiculopathy; resolved  Plan: Stephen Sullivan is doing well.  Occasional pain in his right shoulder.  He continues to do exercises on his own.  He feels as though he continues to get stronger.  His shoulder is much better than it was before surgery.  He states that the symptoms that he had in his left leg, associated with his lower back have almost completely resolved.  Nothing further is needed at this time.  Reminded him that he will continue to see improvements for at least a year following surgery.  Given the chronicity of his pain prior to surgery, I would not be surprised if he has a longer period of improvement.  Would like to see him back in 6 months, which is approximately 1 year following surgery.  Follow-up: Return in about 6 months (around 12/26/2022).  XR at next visit: Left shoulder  Subjective:  Chief Complaint  Patient presents with   Routine Post Op    Rt RSA DOS 12/22/21    History of Present Illness: Stephen Sullivan is a 66 y.o. male who presents following the above stated procedure.  Surgery was approximately 6 months ago.  Overall, he is doing well.  He does have occasional pain in his right shoulder.  He continues to do exercises on his own.  His primary care provider is giving him some hydrocodone, which he takes occasionally, for general aches and pains.  Regarding his left leg, the burning pains and numbness he was previously experiencing have almost completely resolved.  No issues with the surgical incisions.  He states that he is very cautious with his right shoulder.   Review of Systems: No fevers or chills No numbness or tingling No Chest Pain No shortness of breath   Objective: Ht '5\' 10"'$  (1.778 m)   Wt 167 lb (75.8 kg)   BMI 23.96 kg/m   Physical Exam:  Alert and oriented.  No acute distress.  Right  shoulder surgical incision is healing well.  No surrounding erythema or drainage.  Active forward flexion to 160 degrees, although he has some difficulty when doing this.  Internal rotation to T12.  He has good strength in his right shoulder.  4/5 strength in the supraspinatus.  Fingers are warm and well-perfused.  Sensation is intact in the axillary nerve distribution.  Sensation intact throughout the right hand.   IMAGING: I personally ordered and reviewed the following images:  X-rays of the right shoulder were obtained in clinic today.  These were compared to prior x-rays.  There has been no interval subsidence of the prostheses.  No lucency around the hardware or the screws within the glenoid.  Small amount of calcification superior to the implant.  Glenohumeral joint is reduced.  Impression: Right reverse shoulder arthroplasty in excellent position without evidence of hardware subsidence  Mordecai Rasmussen, MD 06/27/2022 8:52 AM

## 2022-07-28 ENCOUNTER — Encounter (HOSPITAL_COMMUNITY): Payer: Self-pay

## 2022-07-28 NOTE — Therapy (Signed)
PHYSICAL THERAPY DISCHARGE SUMMARY  Visits from Start of Care: 1  Current functional level related to goals / functional outcomes: NA   Remaining deficits: NA   Education / Equipment: NA   Patient agrees to discharge. Patient goals were not met. Patient is being discharged due to the patient's request.

## 2022-10-22 ENCOUNTER — Other Ambulatory Visit: Payer: Self-pay | Admitting: Vascular Surgery

## 2022-11-04 IMAGING — MR MR CERVICAL SPINE W/O CM
4 of 5 series · 27 of 48 positions shown · non-contrast
Comparison: 11/29/2013

CLINICAL DATA: Cervical radiculopathy. Right arm numbness for 3
months. History of cervical spine surgery.

EXAM:
MRI CERVICAL SPINE WITHOUT CONTRAST
TECHNIQUE: Multiplanar, multisequence MR imaging of the cervical spine was
performed. No intravenous contrast was administered.

[Series 2: T1 · sagittal · 3.0mm · 0.41mm/px · 8 of 17 slices shown]
[im 1/17]
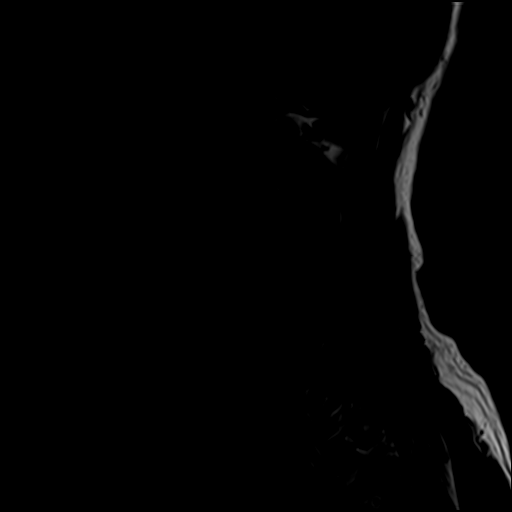
[im 3/17]
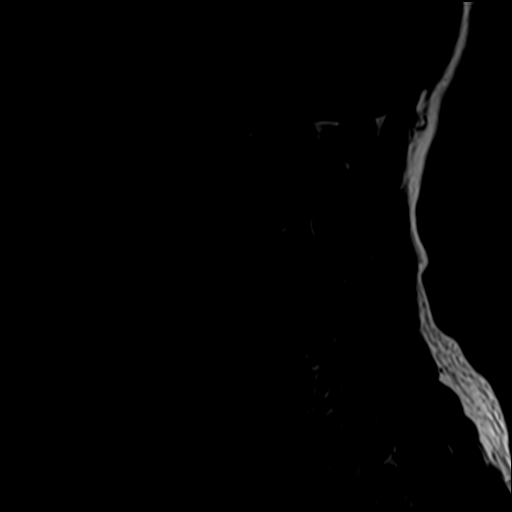
[im 5/17]
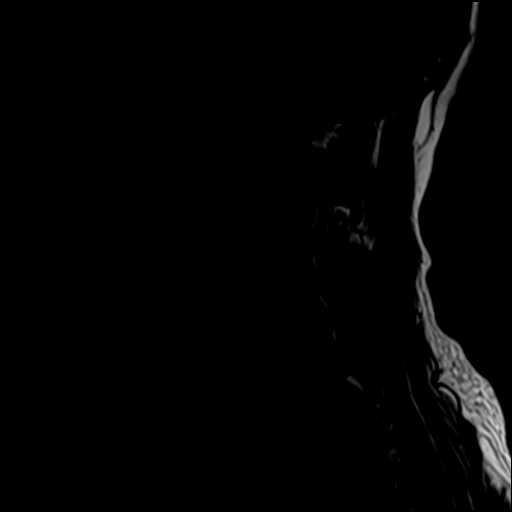
[im 7/17]
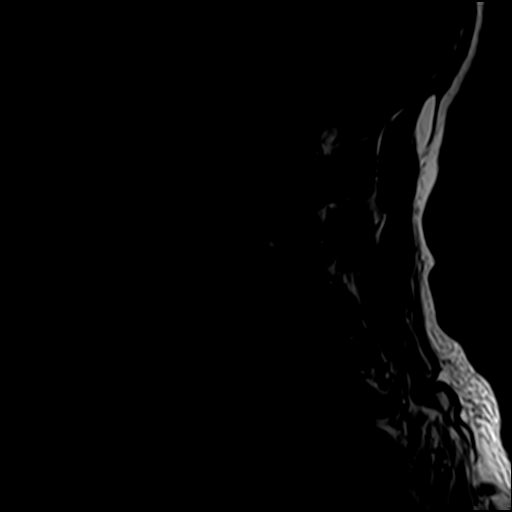
[im 10/17]
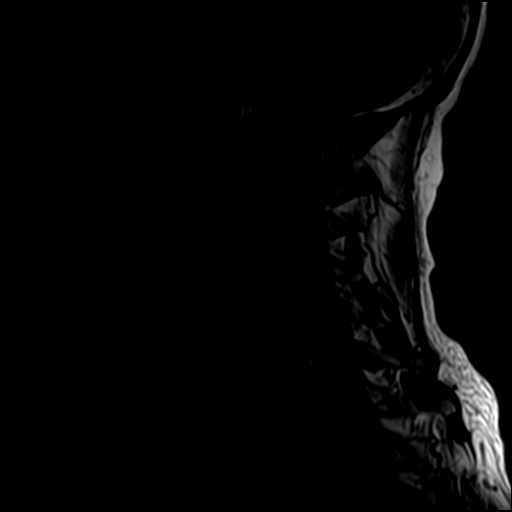
[im 12/17]
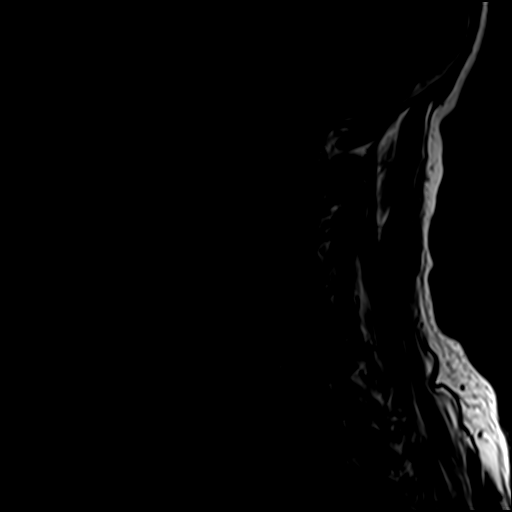
[im 14/17]
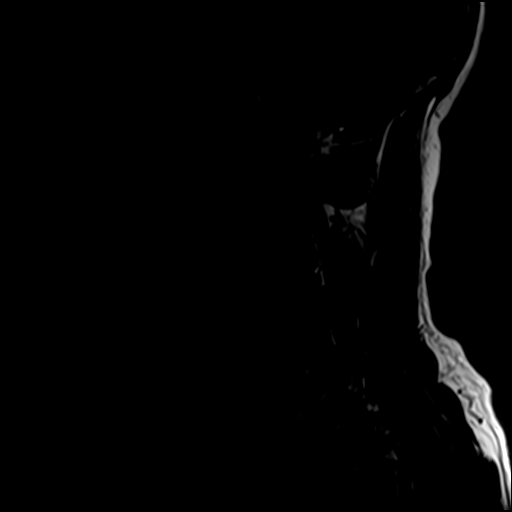
[im 17/17]
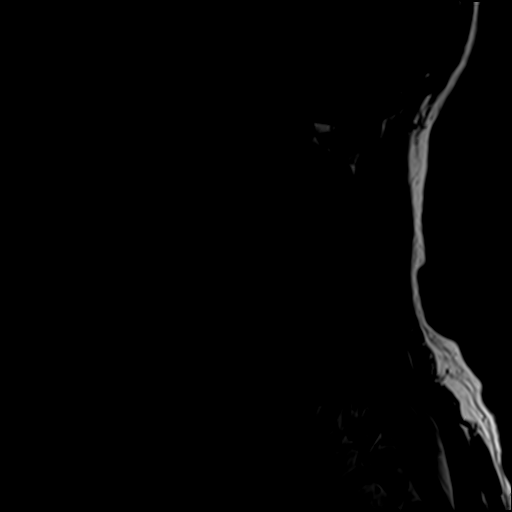

[Series 3: STIR · sagittal · 3.0mm · 0.82mm/px · 3 of 17 slices shown]
[im 3/17]
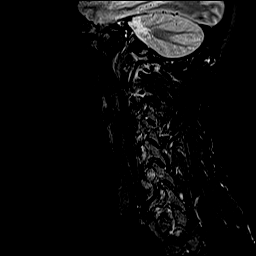
[im 9/17]
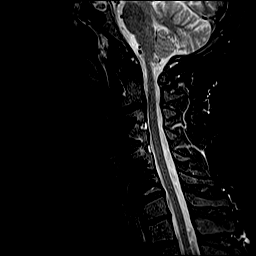
[im 14/17]
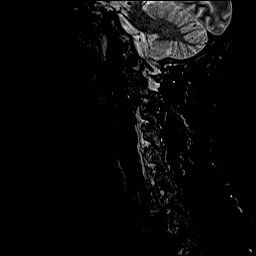

[Series 4: T2 · sagittal · 3.0mm · 0.41mm/px · 7 of 17 slices shown (1 of 2)]
[im 1/17]
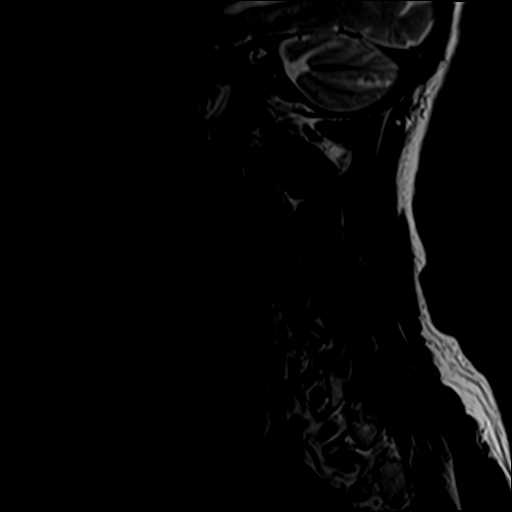
[im 3/17]
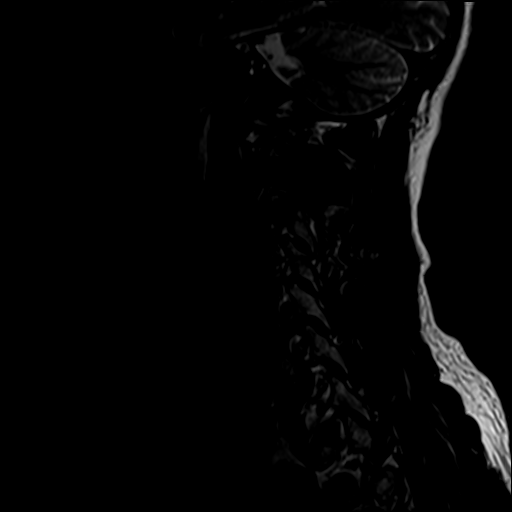
[im 6/17]
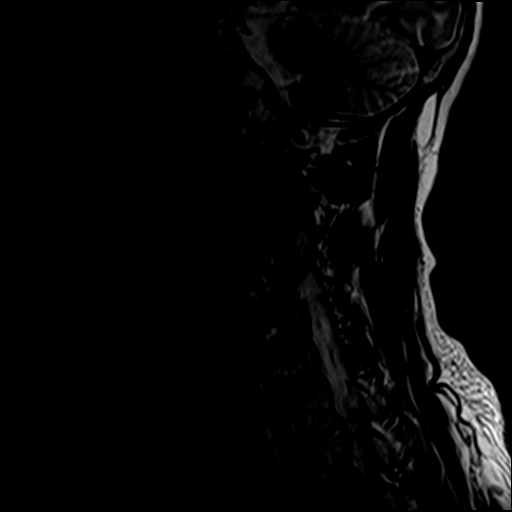
[im 9/17]
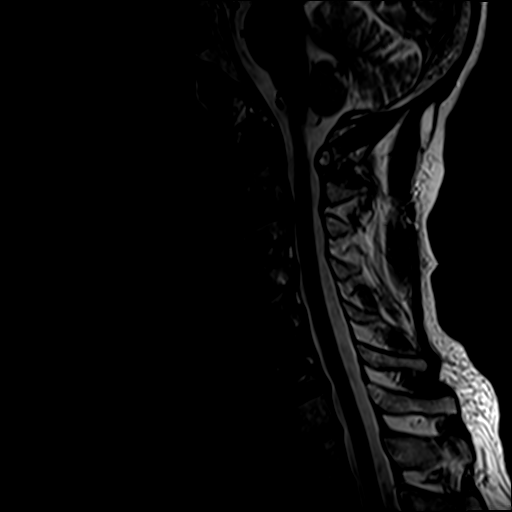
[im 11/17]
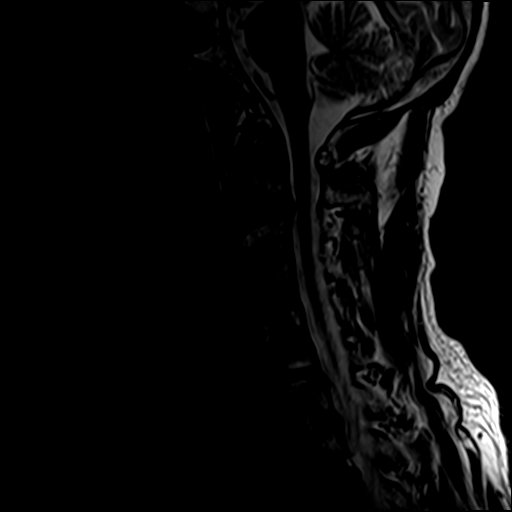
[im 14/17]
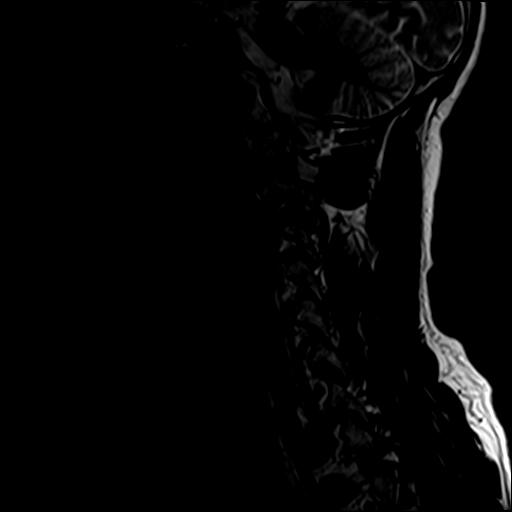
[im 17/17]
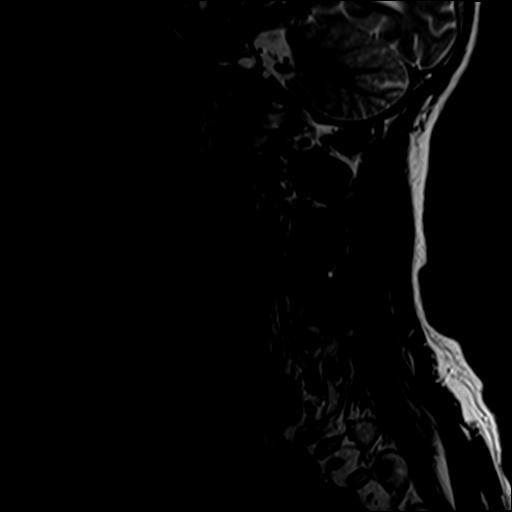

[Series 6: T2 · axial · 3.0mm · 0.70mm/px · z∈[-68,+39]mm · 9 of 30 slices shown (2 of 2)]
[im 1/30]
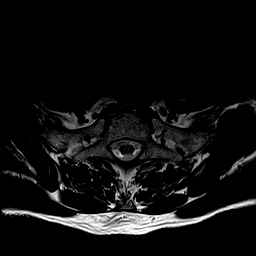
[im 5/30]
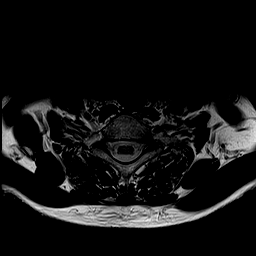
[im 10/30]
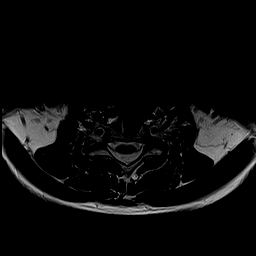
[im 13/30]
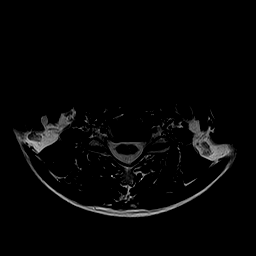
[im 15/30]
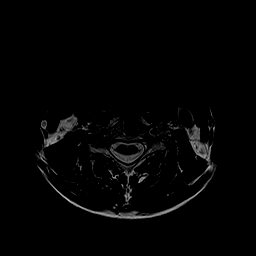
[im 17/30]
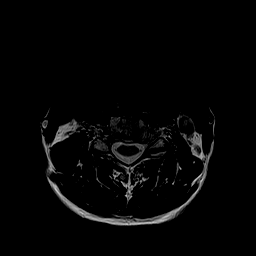
[im 20/30]
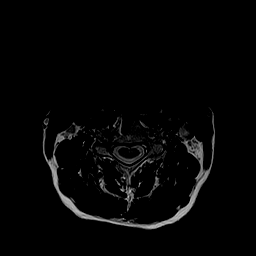
[im 25/30]
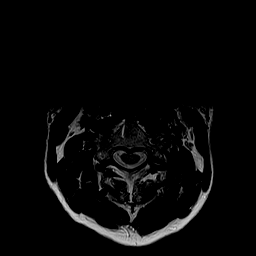
[im 30/30]
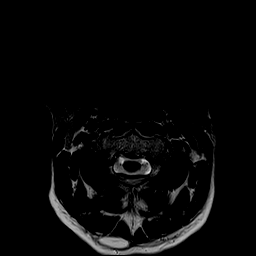

[27 of 48 positions shown; findings below may reference images not displayed]

FINDINGS: Alignment: Cervical spine straightening.  No significant listhesis.

Vertebrae: Interval C3-C6 ACDF with evidence of solid fusion at C3-4
and C4-5. Solid interbody osseous fusion cannot be confirmed at C5-6
by MRI although there may be some bridging bone laterally on the
left. No fracture or suspicious osseous lesion. Mild degenerative
endplate edema at C7-T1.

Cord: Normal signal.

Posterior Fossa, vertebral arteries, paraspinal tissues: Partially
empty sella. Unchanged 2.5 cm subcutaneous lipoma in the
suboccipital region. Preserved vertebral artery flow voids.

Disc levels:

C2-3: Disc bulging, uncovertebral spurring, and mild facet arthrosis
result in new mild left greater than right neural foraminal stenosis
without spinal stenosis.

C3-4: Interval ACDF.  No stenosis.

C4-5: Interval ACDF. Mild residual left neural foraminal stenosis
due to uncovertebral spurring. Patent spinal canal and right neural
foramen.

C5-6: Interval ACDF. Mild residual left neural foraminal stenosis
due to uncovertebral spurring. Patent spinal canal and right neural
foramen.

C6-7: New disc degeneration with mild disc space narrowing. Disc
bulging and uncovertebral spurring result in new moderate left
neural foraminal stenosis. Patent spinal canal and right neural
foramina.

C7-T1: Progressive moderate disc space narrowing. Disc bulging, a
right foraminal disc protrusion, and uncovertebral spurring result
in new/progressive moderate bilateral neural foraminal stenosis with
potential bilateral C8 nerve root impingement. No spinal stenosis.
IMPRESSION: 1. Interval C3-C6 ACDF with widely patent spinal canal and only mild
residual neural foraminal narrowing.
2. Progressive disc degeneration at C6-7 and C7-T1 with moderate
neural foraminal stenosis on the left at C6-7 and bilaterally at
C7-T1.
3. New mild neural foraminal stenosis at C2-3 due to disc and facet
degeneration.

## 2022-11-15 ENCOUNTER — Ambulatory Visit: Payer: Medicare HMO | Admitting: Physician Assistant

## 2022-11-15 ENCOUNTER — Ambulatory Visit (HOSPITAL_COMMUNITY)
Admission: RE | Admit: 2022-11-15 | Discharge: 2022-11-15 | Disposition: A | Discharge status: Home / Self Care | Payer: Medicare HMO | Source: Ambulatory Visit | Attending: Vascular Surgery | Admitting: Vascular Surgery

## 2022-11-15 ENCOUNTER — Ambulatory Visit (INDEPENDENT_AMBULATORY_CARE_PROVIDER_SITE_OTHER)
Admission: RE | Admit: 2022-11-15 | Discharge: 2022-11-15 | Disposition: A | Discharge status: Home / Self Care | Payer: Medicare HMO | Source: Ambulatory Visit | Attending: Vascular Surgery | Admitting: Vascular Surgery

## 2022-11-15 VITALS — BP 92/70 | HR 57 | Temp 97.5°F | Resp 20 | Ht 70.0 in | Wt 164.8 lb

## 2022-11-15 DIAGNOSIS — I70213 Atherosclerosis of native arteries of extremities with intermittent claudication, bilateral legs: Secondary | ICD-10-CM | POA: Diagnosis not present

## 2022-11-15 DIAGNOSIS — I739 Peripheral vascular disease, unspecified: Secondary | ICD-10-CM | POA: Insufficient documentation

## 2022-11-15 DIAGNOSIS — I70212 Atherosclerosis of native arteries of extremities with intermittent claudication, left leg: Secondary | ICD-10-CM

## 2022-11-15 LAB — VAS US ABI WITH/WO TBI
Left ABI: 0.94
Right ABI: 1.16

## 2022-11-15 NOTE — H&P (View-Only) (Signed)
Office Note     CC:  follow up Requesting Provider:  Rebekah Chesterfield, NP  HPI: Stephen Sullivan is a 67 y.o. ([birth year]) male who presents for follow up of PAD. He has prior history of right lower extremity claudication with SFA stenting and CF angioplasty by Dr. Randie Heinz in March of 2022. Following his intervention he has had resolution of his claudication symptoms. At his last visit in June of 2023 he reported some symptoms in his left leg however they were not classically claudication or rest pain symptoms. His non invasive studies at the time were also unchanged so and he was advised to follow up with PCP regarding his pain. However, today he clearly describes claudication symptoms in left leg. He is having hip/ thigh claudication on ambulation 20 yards. He says he has to stop and rest for a while but it will occur again on continued ambulation. He denies any pain on rest. No tissue loss. He says he tries to remain active but due to left leg he is unable to do what he wants. He had been traveling around country to visit his brother but says he cannot fly any more because he cannot tolerate walking the airport. He continues to smoke occasionally. He is compliant with his statin and Plavix.   Past Medical History:  Diagnosis Date   Arthritis    Back pain    Cataracts, bilateral    immature   GERD (gastroesophageal reflux disease)    using Baking Soda and Water per pt   Headache(784.0)    related to neck issues   History of bronchitis    History of gastric ulcer 20+yrs ago   Hypertension    takes Benazepril some days   Joint pain    Muscle spasm    takes Flexeril daily   Shortness of breath    with exertion   Weakness    and numbness in left fingers    Past Surgical History:  Procedure Laterality Date   ABDOMINAL AORTOGRAM W/LOWER EXTREMITY N/A 10/18/2020   Procedure: ABDOMINAL AORTOGRAM W/LOWER EXTREMITY;  Surgeon: Maeola Harman, MD;  Location: [facility] INVASIVE CV LAB;   Service: Cardiovascular;  Laterality: N/A;   ANTERIOR CERVICAL DECOMP/DISCECTOMY FUSION N/A 12/18/2013   Procedure: ANTERIOR CERVICAL DECOMPRESSION/DISCECTOMY FUSION CERVICAL THREE-FOUR, FOUR-FIVE, FIVE-SIX W/ BONEGRAFT;  Surgeon: Hewitt Shorts, MD;  Location: [facility]UR[facility];  Service: Neurosurgery;  Laterality: N/A;  ANTERIOR CERVICAL DECOMPRESSION/DISCECTOMY FUSION CERVICAL THREE-FOUR, FOUR-FIVE, FIVE-SIX W/ BONEGRAFT   arm surgery Left    COLONOSCOPY WITH PROPOFOL N/A 11/15/2017   Procedure: COLONOSCOPY WITH PROPOFOL;  Surgeon: Corbin Ade, MD;  Location: [facility];  Service: Endoscopy;  Laterality: N/A;  12:30pm   ESOPHAGOGASTRODUODENOSCOPY (EGD) WITH PROPOFOL N/A 10/05/2016   Procedure: ESOPHAGOGASTRODUODENOSCOPY (EGD) WITH PROPOFOL;  Surgeon: Corbin Ade, MD;  Location: [facility];  Service: Endoscopy;  Laterality: N/A;   ESOPHAGOGASTRODUODENOSCOPY (EGD) WITH PROPOFOL N/A 02/15/2017   Procedure: ESOPHAGOGASTRODUODENOSCOPY (EGD) WITH PROPOFOL;  Surgeon: Corbin Ade, MD;  Location: [facility];  Service: Endoscopy;  Laterality: N/A;  1115   HARDWARE REMOVAL Right 12/22/2021   Procedure: HARDWARE REMOVAL;  Surgeon: Oliver Barre, MD;  Location: [facility];  Service: Orthopedics;  Laterality: Right;   INGUINAL HERNIA REPAIR Right 08/18/2016   Procedure: RIGHT INGUINAL HERNORRHAPHY WITH MESH;  Surgeon: Franky Macho, MD;  Location: [facility];  Service: General;  Laterality: Right;   JOINT REPLACEMENT Right    hip    PERIPHERAL  VASCULAR INTERVENTION Right 10/18/2020   Procedure: PERIPHERAL VASCULAR INTERVENTION;  Surgeon: Maeola Harman, MD;  Location: [facility] INVASIVE CV LAB;  Service: Cardiovascular;  Laterality: Right;   POLYPECTOMY  11/15/2017   Procedure: POLYPECTOMY;  Surgeon: Corbin Ade, MD;  Location: [facility];  Service: Endoscopy;;  polyp cs splenic flexure times 2,  cecal polyp   REVERSE SHOULDER ARTHROPLASTY Right 12/22/2021   Procedure: REVERSE SHOULDER  ARTHROPLASTY;  Surgeon: Oliver Barre, MD;  Location: [facility];  Service: Orthopedics;  Laterality: Right;   SHOULDER SURGERY Right    x 3     Social History   Socioeconomic History   Marital status: Single    Spouse name: Not on file   Number of children: Not on file   Years of education: Not on file   Highest education level: Not on file  Occupational History   Occupation: disabled  Tobacco Use   Smoking status: Some Days    Packs/day: 0.10    Years: 47.00    Additional pack years: 0.00    Total pack years: 4.70    Types: Cigarettes    Passive exposure: Never   Smokeless tobacco: Never  Substance and Sexual Activity   Alcohol use: No   Drug use: Yes    Types: Marijuana   Sexual activity: Not Currently  Other Topics Concern   Not on file  Social History Narrative   Not on file   Social Determinants of Health   Financial Resource Strain: Not on file  Food Insecurity: Not on file  Transportation Needs: Not on file  Physical Activity: Not on file  Stress: Not on file  Social Connections: Not on file  Intimate Partner Violence: Not on file    Family History  Problem Relation Age of Onset   Prostate cancer Father    Prostate cancer Brother    Colon cancer Neg Hx     Current Outpatient Medications  Medication Sig Dispense Refill   amLODipine (NORVASC) 10 MG tablet Take 10 mg by mouth daily.     atorvastatin (LIPITOR) 20 MG tablet Take 20 mg by mouth daily.     CALCIUM PO Take 1 tablet by mouth daily.     clopidogrel (PLAVIX) 75 MG tablet TAKE 1 TABLET BY MOUTH EVERY DAY 90 tablet 3   gabapentin (NEURONTIN) 100 MG capsule Take 1 capsule (100 mg total) by mouth 3 (three) times daily. 90 capsule 0   losartan-hydrochlorothiazide (HYZAAR) 50-12.5 MG tablet Take 1 tablet by mouth daily.     pantoprazole (PROTONIX) 40 MG tablet Take 1 tablet (40 mg total) by mouth daily. (Patient taking differently: Take 40 mg by mouth 2 (two) times daily.) 90 tablet 3   predniSONE  (STERAPRED UNI-PAK 21 TAB) 10 MG (21) TBPK tablet 10 mg DS 12 as directed 48 tablet 0   No current facility-administered medications for this visit.    Allergies  Allergen Reactions   Ibuprofen Other (See Comments)    Causes bleeding   Nsaids Other (See Comments)    Not to take - "busted intestines open"     REVIEW OF SYSTEMS:  [X]  denotes positive finding, [ ]  denotes negative finding Cardiac  Comments:  Chest pain or chest pressure:    Shortness of breath upon exertion:    Short of breath when lying flat:    Irregular heart rhythm:        Vascular    Pain in calf, thigh, or hip brought on by  ambulation:    Pain in feet at night that wakes you up from your sleep:     Blood clot in your veins:    Leg swelling:         Pulmonary    Oxygen at home:    Productive cough:     Wheezing:         Neurologic    Sudden weakness in arms or legs:     Sudden numbness in arms or legs:     Sudden onset of difficulty speaking or slurred speech:    Temporary loss of vision in one eye:     Problems with dizziness:         Gastrointestinal    Blood in stool:     Vomited blood:         Genitourinary    Burning when urinating:     Blood in urine:        Psychiatric    Major depression:         Hematologic    Bleeding problems:    Problems with blood clotting too easily:        Skin    Rashes or ulcers:        Constitutional    Fever or chills:      PHYSICAL EXAMINATION:  Vitals:   11/15/22 1058  BP: 92/70  Pulse: (!) 57  Resp: 20  Temp: (!) 97.5 F (36.4 C)  TempSrc: Temporal  SpO2: 100%  Weight: 164 lb 12.8 oz (74.8 kg)  Height: 5\' 10"  (1.778 m)    General:  WDWN in NAD; vital signs documented above Gait: Normal HENT: WNL, normocephalic Pulmonary: normal non-labored breathing , without wheezing Cardiac: regular HR Abdomen: soft Vascular Exam/Pulses: 2+ right femoral, 1+ left femoral, no palpable distal pulses on left. Right DP and PT pulses palpable.  Both feet warm and well perfused Extremities: without ischemic changes, without Gangrene , without cellulitis; without open wounds;  Musculoskeletal: no muscle wasting or atrophy  Neurologic: A&O X 3 Psychiatric:  The pt has Normal affect.   Non-Invasive Vascular Imaging:   +-------+-----------+-----------+------------+------------+  ABI/TBIToday's ABIToday's TBIPrevious ABIPrevious TBI  +-------+-----------+-----------+------------+------------+  Right 1.16       0          1.18        0             +-------+-----------+-----------+------------+------------+  Left  0.94       0.55       0.97        0             +-------+-----------+-----------+------------+------------+   VAS US Aorta/ IVC/Iliacs: Abdominal Aorta Findings:  +-------------+-------+----------+----------+---------+--------+--------+  Location    AP (cm)Trans (cm)PSV (cm/s)Waveform ThrombusComments  +-------------+-------+----------+----------+---------+--------+--------+  Distal                       44        biphasic                   +-------------+-------+----------+----------+---------+--------+--------+  RT CIA Prox                   102       triphasic                  +-------------+-------+----------+----------+---------+--------+--------+  RT CIA Mid                    183  biphasic                   +-------------+-------+----------+----------+---------+--------+--------+  RT CIA Distal                 153       biphasic                   +-------------+-------+----------+----------+---------+--------+--------+  RT EIA Prox                   155       biphasic                   +-------------+-------+----------+----------+---------+--------+--------+  RT EIA Mid                    227       biphasic                   +-------------+-------+----------+----------+---------+--------+--------+  RT EIA Distal                 270        triphasic                  +-------------+-------+----------+----------+---------+--------+--------+  LT CIA Prox                   73        triphasic                  +-------------+-------+----------+----------+---------+--------+--------+  LT CIA Mid                    107       triphasic                  +-------------+-------+----------+----------+---------+--------+--------+  LT CIA Distal                 122       triphasic                  +-------------+-------+----------+----------+---------+--------+--------+  LT EIA Prox                   131       triphasic                  +-------------+-------+----------+----------+---------+--------+--------+  LT EIA Mid                    149       triphasic                  +-------------+-------+----------+----------+---------+--------+--------+  LT EIA Distal                 468       triphasic                  +-------------+-------+----------+----------+---------+--------+--------+    Summary:  Stenosis: +--------------------+-------------+  Location            Stenosis       +--------------------+-------------+  Right External Iliac>50% stenosis  +--------------------+-------------+  Left External Iliac >50% stenosis  +--------------------+-------------+    ASSESSMENT/PLAN:: 67 y.o. male here for follow up for PAD. Continues to have resolution of any RLE symptoms. Patient is now having lifestyle limiting hip/thigh claudication of left lower extremity. No rest pain or tissue loss. Duplex shows elevated velocities in the left external iliac artery. 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 I have recommended Aortogram, Arteriogram of LLE. I discussed risks, benefits, alternatives to the procedure with patient and he would like to proceed.  - He is on  Plavix which will need to be held 3 days prior to procedure - Will arrange Aortogram, Arteriogram LLE in the near future with Dr. Randie Heinzain.    Graceann Congressorrina Ata Pecha, PA-C [facility] [phone]  Clinic MD:   [place]/ Randie Heinzain

## 2022-11-15 NOTE — Progress Notes (Signed)
Office Note     CC:  follow up Requesting Provider:  Dickey, Kirkland M, NP  HPI: Stephen Sullivan is a 66 y.o. (04/15/1956) male who presents for follow up of PAD. He has prior history of right lower extremity claudication with SFA stenting and CF angioplasty by Dr. Cain in March of 2022. Following his intervention he has had resolution of his claudication symptoms. At his last visit in June of 2023 he reported some symptoms in his left leg however they were not classically claudication or rest pain symptoms. His non invasive studies at the time were also unchanged so and he was advised to follow up with PCP regarding his pain. However, today he clearly describes claudication symptoms in left leg. He is having hip/ thigh claudication on ambulation 20 yards. He says he has to stop and rest for a while but it will occur again on continued ambulation. He denies any pain on rest. No tissue loss. He says he tries to remain active but due to left leg he is unable to do what he wants. He had been traveling around country to visit his brother but says he cannot fly any more because he cannot tolerate walking the airport. He continues to smoke occasionally. He is compliant with his statin and Plavix.   Past Medical History:  Diagnosis Date   Arthritis    Back pain    Cataracts, bilateral    immature   GERD (gastroesophageal reflux disease)    using Baking Soda and Water per pt   Headache(784.0)    related to neck issues   History of bronchitis    History of gastric ulcer 20+yrs ago   Hypertension    takes Benazepril some days   Joint pain    Muscle spasm    takes Flexeril daily   Shortness of breath    with exertion   Weakness    and numbness in left fingers    Past Surgical History:  Procedure Laterality Date   ABDOMINAL AORTOGRAM W/LOWER EXTREMITY N/A 10/18/2020   Procedure: ABDOMINAL AORTOGRAM W/LOWER EXTREMITY;  Surgeon: Cain, Brandon Christopher, MD;  Location: [facility] INVASIVE CV LAB;   Service: Cardiovascular;  Laterality: N/A;   ANTERIOR CERVICAL DECOMP/DISCECTOMY FUSION N/A 12/18/2013   Procedure: ANTERIOR CERVICAL DECOMPRESSION/DISCECTOMY FUSION CERVICAL THREE-FOUR, FOUR-FIVE, FIVE-SIX W/ BONEGRAFT;  Surgeon: Robert W Nudelman, MD;  Location: [facility];  Service: Neurosurgery;  Laterality: N/A;  ANTERIOR CERVICAL DECOMPRESSION/DISCECTOMY FUSION CERVICAL THREE-FOUR, FOUR-FIVE, FIVE-SIX W/ BONEGRAFT   arm surgery Left    COLONOSCOPY WITH PROPOFOL N/A 11/15/2017   Procedure: COLONOSCOPY WITH PROPOFOL;  Surgeon: Rourk, Robert M, MD;  Location: [facility];  Service: Endoscopy;  Laterality: N/A;  12:30pm   ESOPHAGOGASTRODUODENOSCOPY (EGD) WITH PROPOFOL N/A 10/05/2016   Procedure: ESOPHAGOGASTRODUODENOSCOPY (EGD) WITH PROPOFOL;  Surgeon: Robert M Rourk, MD;  Location: [facility];  Service: Endoscopy;  Laterality: N/A;   ESOPHAGOGASTRODUODENOSCOPY (EGD) WITH PROPOFOL N/A 02/15/2017   Procedure: ESOPHAGOGASTRODUODENOSCOPY (EGD) WITH PROPOFOL;  Surgeon: Rourk, Robert M, MD;  Location: [facility];  Service: Endoscopy;  Laterality: N/A;  1115   HARDWARE REMOVAL Right 12/22/2021   Procedure: HARDWARE REMOVAL;  Surgeon: Cairns, Mark A, MD;  Location: [facility];  Service: Orthopedics;  Laterality: Right;   INGUINAL HERNIA REPAIR Right 08/18/2016   Procedure: RIGHT INGUINAL HERNORRHAPHY WITH MESH;  Surgeon: Mark Jenkins, MD;  Location: [facility];  Service: General;  Laterality: Right;   JOINT REPLACEMENT Right    hip    PERIPHERAL   VASCULAR INTERVENTION Right 10/18/2020   Procedure: PERIPHERAL VASCULAR INTERVENTION;  Surgeon: Cain, Brandon Christopher, MD;  Location: [facility];  Service: Cardiovascular;  Laterality: Right;   POLYPECTOMY  11/15/2017   Procedure: POLYPECTOMY;  Surgeon: Rourk, Robert M, MD;  Location: [facility];  Service: Endoscopy;;  polyp cs splenic flexure times 2,  cecal polyp   REVERSE SHOULDER ARTHROPLASTY Right 12/22/2021   Procedure: REVERSE SHOULDER  ARTHROPLASTY;  Surgeon: Cairns, Mark A, MD;  Location: [facility];  Service: Orthopedics;  Laterality: Right;   SHOULDER SURGERY Right    x 3     Social History   Socioeconomic History   Marital status: Single    Spouse name: Not on file   Number of children: Not on file   Years of education: Not on file   Highest education level: Not on file  Occupational History   Occupation: disabled  Tobacco Use   Smoking status: Some Days    Packs/day: 0.10    Years: 47.00    Additional pack years: 0.00    Total pack years: 4.70    Types: Cigarettes    Passive exposure: Never   Smokeless tobacco: Never  Substance and Sexual Activity   Alcohol use: No   Drug use: Yes    Types: Marijuana   Sexual activity: Not Currently  Other Topics Concern   Not on file  Social History Narrative   Not on file   Social Determinants of Health   Financial Resource Strain: Not on file  Food Insecurity: Not on file  Transportation Needs: Not on file  Physical Activity: Not on file  Stress: Not on file  Social Connections: Not on file  Intimate Partner Violence: Not on file    Family History  Problem Relation Age of Onset   Prostate cancer Father    Prostate cancer Brother    Colon cancer Neg Hx     Current Outpatient Medications  Medication Sig Dispense Refill   amLODipine (NORVASC) 10 MG tablet Take 10 mg by mouth daily.     atorvastatin (LIPITOR) 20 MG tablet Take 20 mg by mouth daily.     CALCIUM PO Take 1 tablet by mouth daily.     clopidogrel (PLAVIX) 75 MG tablet TAKE 1 TABLET BY MOUTH EVERY DAY 90 tablet 3   gabapentin (NEURONTIN) 100 MG capsule Take 1 capsule (100 mg total) by mouth 3 (three) times daily. 90 capsule 0   losartan-hydrochlorothiazide (HYZAAR) 50-12.5 MG tablet Take 1 tablet by mouth daily.     pantoprazole (PROTONIX) 40 MG tablet Take 1 tablet (40 mg total) by mouth daily. (Patient taking differently: Take 40 mg by mouth 2 (two) times daily.) 90 tablet 3   predniSONE  (STERAPRED UNI-PAK 21 TAB) 10 MG (21) TBPK tablet 10 mg DS 12 as directed 48 tablet 0   No current facility-administered medications for this visit.    Allergies  Allergen Reactions   Ibuprofen Other (See Comments)    Causes bleeding   Nsaids Other (See Comments)    Not to take - "busted intestines open"     REVIEW OF SYSTEMS:  [X] denotes positive finding, [ ] denotes negative finding Cardiac  Comments:  Chest pain or chest pressure:    Shortness of breath upon exertion:    Short of breath when lying flat:    Irregular heart rhythm:        Vascular    Pain in calf, thigh, or hip brought on by   ambulation:    Pain in feet at night that wakes you up from your sleep:     Blood clot in your veins:    Leg swelling:         Pulmonary    Oxygen at home:    Productive cough:     Wheezing:         Neurologic    Sudden weakness in arms or legs:     Sudden numbness in arms or legs:     Sudden onset of difficulty speaking or slurred speech:    Temporary loss of vision in one eye:     Problems with dizziness:         Gastrointestinal    Blood in stool:     Vomited blood:         Genitourinary    Burning when urinating:     Blood in urine:        Psychiatric    Major depression:         Hematologic    Bleeding problems:    Problems with blood clotting too easily:        Skin    Rashes or ulcers:        Constitutional    Fever or chills:      PHYSICAL EXAMINATION:  Vitals:   11/15/22 1058  BP: 92/70  Pulse: (!) 57  Resp: 20  Temp: (!) 97.5 F (36.4 C)  TempSrc: Temporal  SpO2: 100%  Weight: 164 lb 12.8 oz (74.8 kg)  Height: 5' 10" (1.778 m)    General:  WDWN in NAD; vital signs documented above Gait: Normal HENT: WNL, normocephalic Pulmonary: normal non-labored breathing , without wheezing Cardiac: regular HR Abdomen: soft Vascular Exam/Pulses: 2+ right femoral, 1+ left femoral, no palpable distal pulses on left. Right DP and PT pulses palpable.  Both feet warm and well perfused Extremities: without ischemic changes, without Gangrene , without cellulitis; without open wounds;  Musculoskeletal: no muscle wasting or atrophy  Neurologic: A&O X 3 Psychiatric:  The pt has Normal affect.   Non-Invasive Vascular Imaging:   +-------+-----------+-----------+------------+------------+  ABI/TBIToday's ABIToday's TBIPrevious ABIPrevious TBI  +-------+-----------+-----------+------------+------------+  Right 1.16       0          1.18        0             +-------+-----------+-----------+------------+------------+  Left  0.94       0.55       0.97        0             +-------+-----------+-----------+------------+------------+   VAS US Aorta/ IVC/Iliacs: Abdominal Aorta Findings:  +-------------+-------+----------+----------+---------+--------+--------+  Location    AP (cm)Trans (cm)PSV (cm/s)Waveform ThrombusComments  +-------------+-------+----------+----------+---------+--------+--------+  Distal                       44        biphasic                   +-------------+-------+----------+----------+---------+--------+--------+  RT CIA Prox                   102       triphasic                  +-------------+-------+----------+----------+---------+--------+--------+  RT CIA Mid                    183         biphasic                   +-------------+-------+----------+----------+---------+--------+--------+  RT CIA Distal                 153       biphasic                   +-------------+-------+----------+----------+---------+--------+--------+  RT EIA Prox                   155       biphasic                   +-------------+-------+----------+----------+---------+--------+--------+  RT EIA Mid                    227       biphasic                   +-------------+-------+----------+----------+---------+--------+--------+  RT EIA Distal                 270        triphasic                  +-------------+-------+----------+----------+---------+--------+--------+  LT CIA Prox                   73        triphasic                  +-------------+-------+----------+----------+---------+--------+--------+  LT CIA Mid                    107       triphasic                  +-------------+-------+----------+----------+---------+--------+--------+  LT CIA Distal                 122       triphasic                  +-------------+-------+----------+----------+---------+--------+--------+  LT EIA Prox                   131       triphasic                  +-------------+-------+----------+----------+---------+--------+--------+  LT EIA Mid                    149       triphasic                  +-------------+-------+----------+----------+---------+--------+--------+  LT EIA Distal                 468       triphasic                  +-------------+-------+----------+----------+---------+--------+--------+    Summary:  Stenosis: +--------------------+-------------+  Location            Stenosis       +--------------------+-------------+  Right External Iliac>50% stenosis  +--------------------+-------------+  Left External Iliac >50% stenosis  +--------------------+-------------+    ASSESSMENT/PLAN:: 66 y.o. male here for follow up for PAD. Continues to have resolution of any RLE symptoms. Patient is now having lifestyle limiting hip/thigh claudication of left lower extremity. No rest pain or tissue loss. Duplex shows elevated velocities in the left external iliac artery. 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 I have recommended Aortogram, Arteriogram of LLE. I discussed risks, benefits, alternatives to the procedure with patient and he would like to proceed.  - He is on  Plavix which will need to be held 3 days prior to procedure - Will arrange Aortogram, Arteriogram LLE in the near future with Dr. Cain.    Charlie Seda, PA-C [facility] [phone]  Clinic MD:   Dickson/ Cain 

## 2022-11-17 ENCOUNTER — Other Ambulatory Visit: Payer: Self-pay

## 2022-11-17 DIAGNOSIS — I70212 Atherosclerosis of native arteries of extremities with intermittent claudication, left leg: Secondary | ICD-10-CM

## 2022-11-17 DIAGNOSIS — I739 Peripheral vascular disease, unspecified: Secondary | ICD-10-CM

## 2022-11-20 ENCOUNTER — Ambulatory Visit (HOSPITAL_COMMUNITY): Admission: RE | Admit: 2022-11-20 | Payer: Medicare HMO | Source: Home / Self Care | Admitting: Vascular Surgery

## 2022-11-20 ENCOUNTER — Encounter (HOSPITAL_COMMUNITY): Admission: RE | Payer: Self-pay | Source: Home / Self Care

## 2022-11-20 SURGERY — ABDOMINAL AORTOGRAM W/LOWER EXTREMITY
Anesthesia: LOCAL

## 2022-11-24 ENCOUNTER — Telehealth: Payer: Self-pay

## 2022-11-24 NOTE — Telephone Encounter (Signed)
-----   Message from Maeola Harman, MD sent at 11/24/2022  8:18 AM EDT ----- Regarding: RE: P2P Iliac pta/stent should be sufficient and then I will document if more needs to be done.   Bc  ----- Message ----- From: Yolonda Kida, LPN Sent: 5/62/[id number]   1:00 PM EDT To: Maeola Harman, MD Subject: P2P                                            Hello.  This is another possible Peer to Peer.  [employer] approved Iliac PTA with stent, Iliac PTA additional vessel, and Iliac PTA and stent with additional vessel.  They are not approving Fem-pop PTA-w or wo atherectomy or tib-peroneal PTA w or wo Atherectomy.  Please advise if the iliac Berkley Harvey is sufficient.  If you need to operate anywhere else, we have to set up a P2P.  Thanks

## 2022-11-27 ENCOUNTER — Telehealth: Payer: Self-pay | Admitting: Vascular Surgery

## 2022-11-27 ENCOUNTER — Other Ambulatory Visit: Payer: Self-pay

## 2022-11-27 ENCOUNTER — Encounter (HOSPITAL_COMMUNITY): Payer: Self-pay | Admitting: Vascular Surgery

## 2022-11-27 ENCOUNTER — Encounter (HOSPITAL_COMMUNITY)
Admission: RE | Disposition: A | Discharge status: Home / Self Care | Payer: Self-pay | Source: Home / Self Care | Attending: Vascular Surgery

## 2022-11-27 ENCOUNTER — Ambulatory Visit (HOSPITAL_COMMUNITY)
Admission: RE | Admit: 2022-11-27 | Discharge: 2022-11-27 | Disposition: A | Discharge status: Home / Self Care | Payer: Medicare HMO | Attending: Vascular Surgery | Admitting: Vascular Surgery

## 2022-11-27 DIAGNOSIS — I70212 Atherosclerosis of native arteries of extremities with intermittent claudication, left leg: Secondary | ICD-10-CM | POA: Diagnosis present

## 2022-11-27 DIAGNOSIS — Z95820 Peripheral vascular angioplasty status with implants and grafts: Secondary | ICD-10-CM | POA: Diagnosis not present

## 2022-11-27 DIAGNOSIS — I739 Peripheral vascular disease, unspecified: Secondary | ICD-10-CM

## 2022-11-27 DIAGNOSIS — I708 Atherosclerosis of other arteries: Secondary | ICD-10-CM | POA: Insufficient documentation

## 2022-11-27 DIAGNOSIS — F17210 Nicotine dependence, cigarettes, uncomplicated: Secondary | ICD-10-CM | POA: Diagnosis not present

## 2022-11-27 DIAGNOSIS — Z7902 Long term (current) use of antithrombotics/antiplatelets: Secondary | ICD-10-CM | POA: Diagnosis not present

## 2022-11-27 HISTORY — PX: PERIPHERAL VASCULAR INTERVENTION: CATH118257

## 2022-11-27 HISTORY — PX: ABDOMINAL AORTOGRAM W/LOWER EXTREMITY: CATH118223

## 2022-11-27 LAB — POCT I-STAT, CHEM 8
BUN: 11 mg/dL (ref 8–23)
Calcium, Ion: 1.23 mmol/L (ref 1.15–1.40)
Chloride: 99 mmol/L (ref 98–111)
Creatinine, Ser: 1.3 mg/dL — ABNORMAL HIGH (ref 0.61–1.24)
Glucose, Bld: 88 mg/dL (ref 70–99)
HCT: 46 % (ref 39.0–52.0)
Hemoglobin: 15.6 g/dL (ref 13.0–17.0)
Potassium: 3.5 mmol/L (ref 3.5–5.1)
Sodium: 139 mmol/L (ref 135–145)
TCO2: 27 mmol/L (ref 22–32)

## 2022-11-27 SURGERY — ABDOMINAL AORTOGRAM W/LOWER EXTREMITY
Anesthesia: LOCAL

## 2022-11-27 MED ORDER — SODIUM CHLORIDE 0.9 % IV SOLN: 250.0000 mL | INTRAVENOUS | Status: DC | PRN

## 2022-11-27 MED ORDER — HEPARIN (PORCINE) IN NACL 1000-0.9 UT/500ML-% IV SOLN
INTRAVENOUS | Status: DC | PRN
  Administered 2022-11-27 (×2): 500 mL

## 2022-11-27 MED ORDER — ASPIRIN 81 MG PO CHEW
CHEWABLE_TABLET | ORAL | Status: AC
  Filled 2022-11-27: qty 1

## 2022-11-27 MED ORDER — FENTANYL CITRATE (PF) 100 MCG/2ML IJ SOLN
INTRAMUSCULAR | Status: DC | PRN
  Administered 2022-11-27 (×2): 50 ug via INTRAVENOUS

## 2022-11-27 MED ORDER — HYDRALAZINE HCL 20 MG/ML IJ SOLN
INTRAMUSCULAR | Status: DC | PRN
  Administered 2022-11-27 (×2): 5 mg via INTRAVENOUS

## 2022-11-27 MED ORDER — LIDOCAINE HCL (PF) 1 % IJ SOLN
INTRAMUSCULAR | Status: DC | PRN
  Administered 2022-11-27: 10 mL

## 2022-11-27 MED ORDER — ASPIRIN 81 MG PO CHEW
CHEWABLE_TABLET | ORAL | Status: DC | PRN
  Administered 2022-11-27: 81 mg via ORAL

## 2022-11-27 MED ORDER — LABETALOL HCL 5 MG/ML IV SOLN: 10.0000 mg | INTRAVENOUS | Status: DC | PRN

## 2022-11-27 MED ORDER — SODIUM CHLORIDE 0.9% FLUSH: 3.0000 mL | INTRAVENOUS | Status: DC | PRN

## 2022-11-27 MED ORDER — HYDRALAZINE HCL 20 MG/ML IJ SOLN: 5.0000 mg | INTRAMUSCULAR | Status: DC | PRN

## 2022-11-27 MED ORDER — SODIUM CHLORIDE 0.9 % IV SOLN: INTRAVENOUS | Status: DC

## 2022-11-27 MED ORDER — MORPHINE SULFATE (PF) 2 MG/ML IV SOLN: 2.0000 mg | INTRAVENOUS | Status: DC | PRN

## 2022-11-27 MED ORDER — HEPARIN SODIUM (PORCINE) 1000 UNIT/ML IJ SOLN
INTRAMUSCULAR | Status: DC | PRN
  Administered 2022-11-27: 5000 [IU] via INTRAVENOUS

## 2022-11-27 MED ORDER — CLOPIDOGREL BISULFATE 75 MG PO TABS
75.0000 mg | ORAL_TABLET | Freq: Every day | ORAL | Status: DC
Start: 2022-11-28 — End: 2022-11-27

## 2022-11-27 MED ORDER — SODIUM CHLORIDE 0.9% FLUSH: 3.0000 mL | Freq: Two times a day (BID) | INTRAVENOUS | Status: DC

## 2022-11-27 MED ORDER — CLOPIDOGREL BISULFATE 75 MG PO TABS
ORAL_TABLET | ORAL | Status: AC
  Filled 2022-11-27: qty 1

## 2022-11-27 MED ORDER — MIDAZOLAM HCL 2 MG/2ML IJ SOLN
INTRAMUSCULAR | Status: DC | PRN
  Administered 2022-11-27: 1 mg via INTRAVENOUS

## 2022-11-27 MED ORDER — ACETAMINOPHEN 325 MG PO TABS: 650.0000 mg | ORAL_TABLET | ORAL | Status: DC | PRN

## 2022-11-27 MED ORDER — LIDOCAINE HCL (PF) 1 % IJ SOLN
INTRAMUSCULAR | Status: AC
  Filled 2022-11-27: qty 30

## 2022-11-27 MED ORDER — IODIXANOL 320 MG/ML IV SOLN
INTRAVENOUS | Status: DC | PRN
  Administered 2022-11-27: 150 mL

## 2022-11-27 MED ORDER — MIDAZOLAM HCL 2 MG/2ML IJ SOLN
INTRAMUSCULAR | Status: AC
  Filled 2022-11-27: qty 2

## 2022-11-27 MED ORDER — ASPIRIN 81 MG PO TBEC: 81.0000 mg | DELAYED_RELEASE_TABLET | Freq: Every day | ORAL | Status: DC

## 2022-11-27 MED ORDER — FENTANYL CITRATE (PF) 100 MCG/2ML IJ SOLN
INTRAMUSCULAR | Status: AC
  Filled 2022-11-27: qty 2

## 2022-11-27 MED ORDER — HYDRALAZINE HCL 20 MG/ML IJ SOLN
INTRAMUSCULAR | Status: AC
  Filled 2022-11-27: qty 1

## 2022-11-27 MED ORDER — ONDANSETRON HCL 4 MG/2ML IJ SOLN: 4.0000 mg | Freq: Four times a day (QID) | INTRAMUSCULAR | Status: DC | PRN

## 2022-11-27 MED ORDER — OXYCODONE HCL 5 MG PO TABS: 5.0000 mg | ORAL_TABLET | ORAL | Status: DC | PRN

## 2022-11-27 MED ORDER — SODIUM CHLORIDE 0.9 % WEIGHT BASED INFUSION: 1.0000 mL/kg/h | INTRAVENOUS | Status: DC

## 2022-11-27 MED ORDER — CLOPIDOGREL BISULFATE 300 MG PO TABS
ORAL_TABLET | ORAL | Status: DC | PRN
  Administered 2022-11-27: 75 mg via ORAL

## 2022-11-27 MED ORDER — HEPARIN SODIUM (PORCINE) 1000 UNIT/ML IJ SOLN
INTRAMUSCULAR | Status: AC
  Filled 2022-11-27: qty 10

## 2022-11-27 SURGICAL SUPPLY — 25 items
BALLN MUSTANG 6.0X40 75 (BALLOONS) ×2
BALLOON MUSTANG 6.0X40 75 (BALLOONS) IMPLANT
CATH OMNI FLUSH 5F 65CM (CATHETERS) IMPLANT
CATH STRAIGHT 5FR 65CM (CATHETERS) IMPLANT
CLOSURE MYNX CONTROL 6F/7F (Vascular Products) IMPLANT
KIT ENCORE 26 ADVANTAGE (KITS) IMPLANT
KIT ESSENTIALS PG (KITS) IMPLANT
KIT MICROPUNCTURE NIT STIFF (SHEATH) IMPLANT
KIT PV (KITS) ×2 IMPLANT
SHEATH CATAPULT 6FR 45 (SHEATH) IMPLANT
SHEATH CATAPULT 7FR 45 (SHEATH) IMPLANT
SHEATH PINNACLE 5F 10CM (SHEATH) IMPLANT
SHEATH PINNACLE 7F 10CM (SHEATH) IMPLANT
SHEATH PROBE COVER 6X72 (BAG) IMPLANT
STENT ELUVIA 7X60X130 (Permanent Stent) IMPLANT
STENT VIABAHN 7X29X80 VBX (Permanent Stent) IMPLANT
STOPCOCK MORSE 400PSI 3WAY (MISCELLANEOUS) IMPLANT
SYR MEDRAD MARK 7 150ML (SYRINGE) ×2 IMPLANT
TRANSDUCER W/STOPCOCK (MISCELLANEOUS) ×2 IMPLANT
TRAY PV CATH (CUSTOM PROCEDURE TRAY) ×2 IMPLANT
TUBING CIL FLEX 10 FLL-RA (TUBING) IMPLANT
WIRE AMPLATZ SS-J .035X260CM (WIRE) IMPLANT
WIRE COUGAR XT STRL 190CM (WIRE) IMPLANT
WIRE ROSEN-J .035X260CM (WIRE) IMPLANT
WIRE STARTER BENTSON 035X150 (WIRE) IMPLANT

## 2022-11-27 NOTE — Telephone Encounter (Signed)
-----   Message from Maeola Harman, MD sent at 11/27/2022 10:08 AM EDT ----- Stephen Sullivan [id number] October 31, 1955  11/27/2022 Pre-operative Diagnosis: Left lower extremity life-limiting claudication from the buttock to the calf  Surgeon:  Luanna Salk. Randie Heinz, MD  Procedure Performed: 1.  Ultrasound-guided cannulation right common femoral artery and Mynx device percutaneous closure 2.  Catheter in aorta and aortogram and pullback to aortic bifurcation with bilateral lower extremity angiography 3.  Selection of left common femoral, left external iliac and left common iliac arteries for catheter pullback gradient 4.  Stent of left external iliac artery with 7 x 60 mm Eluvia postdilated with 6 mm balloon 5.  Stent of left common iliac artery with 7 x 29 mm VBX 6.  Moderate sedation with fentanyl and Versed for 66 minutes with direct monitoring of vital signs throughout the case  Please have patient follow-up with PA or me in 4 to 6 weeks with aortoiliac duplex and ABIs.  bc

## 2022-11-27 NOTE — Interval H&P Note (Signed)
History and Physical Interval Note:  11/27/2022 7:03 AM  Stephen Sullivan  has presented today for surgery, with the diagnosis of pad w/ claudication of left leg.  The various methods of treatment have been discussed with the patient and family. After consideration of risks, benefits and other options for treatment, the patient has consented to  Procedure(s): ABDOMINAL AORTOGRAM W/LOWER EXTREMITY (N/A) as a surgical intervention.  The patient's history has been reviewed, patient examined, no change in status, stable for surgery.  I have reviewed the patient's chart and labs.  Questions were answered to the patient's satisfaction.     Lemar Livings

## 2022-11-27 NOTE — Op Note (Signed)
Patient name: Stephen Sullivan MRN: [id number] DOB: 1956-01-10 Sex: male  11/27/2022 Pre-operative Diagnosis: Left lower extremity life-limiting claudication from the buttock to the calf Post-operative diagnosis:  Same Surgeon:  Luanna Salk. Randie Heinz, MD Procedure Performed: 1.  Ultrasound-guided cannulation right common femoral artery and Mynx device percutaneous closure 2.  Catheter in aorta and aortogram and pullback to aortic bifurcation with bilateral lower extremity angiography 3.  Selection of left common femoral, left external iliac and left common iliac arteries for catheter pullback gradient 4.  Stent of left external iliac artery with 7 x 60 mm Eluvia postdilated with 6 mm balloon 5.  Stent of left common iliac artery with 7 x 29 mm VBX 6.  Moderate sedation with fentanyl and Versed for 66 minutes with direct monitoring of vital signs throughout the case  Indications: 67 year old male with a history of right SFA drug-eluting stent placement 2 years ago for right lower extremity disabling claudication.  He now has left lower extremity claudication extending from the hip down to the leg with preserved ABI but no left common femoral pulse.  He is indicated for angiography with possible intervention.  Findings: After percutaneous access catheter was placed in the aorta at the level of the renal arteries and aortogram was performed which demonstrated patent aorta with bilateral renal arteries patent with 30% stenosis of the left renal artery that was nonflow limiting.  We then performed angiography from the distal aorta which demonstrated what appeared to be an ulcerated plaque or flow-limiting dissection at the proximal common iliac artery with greater than 80% stenosis in the distal external iliac artery.  Bilateral lower extremity runoff was performed on the right side there is common femoral disease evident in the SFA is then patent proximally with stenosis at the proximal aspect of the  previously placed stent approximately 30% but nonflow limiting the stent itself is patent with three-vessel runoff to the level of the ankle.  On the left side there is again identified the external iliac artery tight stenosis without significant common femoral disease the SFA is patent as is the popliteal artery three-vessel runoff to the ankle.  A pullback gradient from the common femoral artery up to the common iliac artery demonstrated 35 mmHg gradient with most of this gradient occurring at the extrailiac artery stenosis.  I elected to intervene on the extrailiac artery first and performed primary drug-eluting stent placement with postdilatation of 6 mm balloon and this resolved the tight stenosis to less than 10%.  The common iliac artery was then primarily stented to 0% residual stenosis without evidence of ulceration.  Mynx device was deployed in the right common femoral artery for closure.   Procedure:  The patient was identified in the holding area and taken to room 8.  The patient was then placed supine on the table and prepped and draped in the usual sterile fashion.  A time out was called.  Fentanyl and Versed were administered for monitored moderate sedation and vital signs were monitored throughout the case.  Ultrasound was used to evaluate the right common femoral artery there was minimal disease there this was cannulated the most cephalad aspect of the artery with a micropuncture needle followed by wire and the sheath.  A permanent image was saved to the record.  A Bentson wire was placed followed by a 5 [place] sheath and a catheter was placed in the aorta at the level of the renal arteries and aortogram was performed and then pulled back  to the aortic bifurcation and pelvic angiography followed by lower extremity runoff was performed with the above findings.  I then crossed the bifurcation with a Bentson wire and placed a straight catheter in the left common femoral artery placed an 014 wire and  performed a pullback gradient from the common femoral through the external iliac artery on the left and the left common iliac artery which demonstrated a large gradient.  I then elected for treatment.  I placed an Amplatz wire followed by a long 7 [place] sheath.  Patient was given 5000 units of heparin.  The extrailiac artery was primarily stented with a 7 x 60 mm drug-eluting stent postdilated with 6 mm balloon.  The left common neck artery was then primarily stented with a 7 mm covered stent dilated to 7.5 mm and completion demonstrated no residual stenosis in either side.  Pullback gradient was again performed from the left common femoral to the left common iliac and there was no residual gradient and there was a strong palpable left common femoral pulse.  We exchanged for a short 7 French sheath and deployed a minx device.  The patient tolerated the procedure without any complication.    Contrast: 150cc  Notnamed Scholz C. Randie Heinz, MD [facility] Office: [phone] Pager: [phone]

## 2022-11-29 ENCOUNTER — Encounter (HOSPITAL_COMMUNITY): Payer: Self-pay | Admitting: Vascular Surgery

## 2022-11-29 MED ORDER — FENTANYL CITRATE (PF) 100 MCG/2ML IJ SOLN
INTRAMUSCULAR | Status: AC | PRN
  Administered 2022-11-27: 50 ug via INTRAVENOUS

## 2022-12-19 NOTE — Telephone Encounter (Signed)
Appt has been scheduled.

## 2022-12-26 ENCOUNTER — Ambulatory Visit: Payer: Medicare HMO | Admitting: Orthopedic Surgery

## 2022-12-26 ENCOUNTER — Other Ambulatory Visit: Payer: Self-pay | Admitting: *Deleted

## 2022-12-26 DIAGNOSIS — I739 Peripheral vascular disease, unspecified: Secondary | ICD-10-CM

## 2022-12-26 DIAGNOSIS — I70213 Atherosclerosis of native arteries of extremities with intermittent claudication, bilateral legs: Secondary | ICD-10-CM

## 2022-12-26 DIAGNOSIS — I70212 Atherosclerosis of native arteries of extremities with intermittent claudication, left leg: Secondary | ICD-10-CM

## 2022-12-29 ENCOUNTER — Encounter: Payer: Self-pay | Admitting: Orthopedic Surgery

## 2022-12-29 ENCOUNTER — Ambulatory Visit: Payer: Medicare HMO | Admitting: Orthopedic Surgery

## 2022-12-29 ENCOUNTER — Other Ambulatory Visit (INDEPENDENT_AMBULATORY_CARE_PROVIDER_SITE_OTHER): Payer: Medicare HMO

## 2022-12-29 VITALS — BP 127/80 | HR 78 | Ht 70.0 in | Wt 161.0 lb

## 2022-12-29 DIAGNOSIS — Z96611 Presence of right artificial shoulder joint: Secondary | ICD-10-CM

## 2022-12-29 NOTE — Progress Notes (Signed)
Orthopaedic Postop Note  Assessment: Stephen Sullivan is a 67 y.o. male s/p Right Reverse Shoulder Arthroplasty (DOS: 12/22/2021)  Plan: Mr. Borak is doing very well.  He has no pain in his right shoulder.  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  I provided him reassurance, that he has no restrictions, but should do so carefully.  Radiographs demonstrate a well-positioned prosthesis without concern or subsidence.  He has excellent range of motion.  Has very good strength.  He is pleased with his recovery following surgery.  He will contact the clinic if he has any issues.  Otherwise, I like to see him back in approximately 1 year for routine follow-up.   Follow-up: Return in about 1 year (around 12/29/2023).  XR at next visit: Right shoulder  Subjective:  Chief Complaint  Patient presents with   Routine Post Op    R RSA 12/22/21    History of Present Illness: DAMETRIUS HAGOPIAN is a 67 y.o. male who presents following the above stated procedure.  Surgery was approximately 1 year ago.  He is doing well.  He continues to do exercises for his right shoulder.  He has occasional popping in the shoulder, but this is not painful.  He is not taking medicines on a regular basis.  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  He has no numbness or tingling.  No issues with the surgical incision.  Review of Systems: No fevers or chills No numbness or tingling No Chest Pain No shortness of breath   Objective: BP 127/80   Pulse 78   Ht 5\' 10"  (1.778 m)   Wt 161 lb (73 kg)   BMI 23.10 kg/m   Physical Exam:  Alert and oriented.  No acute distress.  Right shoulder surgical incision has healed.  No surrounding erythema or drainage.  No tenderness to palpation.  Mild amount of atrophy over the anterior  shoulder.  He has 170 degrees of forward flexion without difficulty.  Internal rotation to T12.  Slightly restricted external rotation compared to the contralateral side.  5/5 strength in the deltoid.  Fingers are warm and well-perfused.  Sensation intact in the axillary nerve distribution.  IMAGING: I personally ordered and reviewed the following images:  X-rays of the right shoulder were obtained in clinic today.  These are compared to prior x-rays.  Prostheses remain in good position.  No evidence of subsidence.  There is no lucency around the prosthesis.  No acute injuries.  Glenohumeral joint is reduced.  No bony lesions.  Impression: Right reverse shoulder arthroplasty without subsidence or lucency.  Oliver Barre, MD 12/29/2022 9:19 AM

## 2023-01-05 ENCOUNTER — Ambulatory Visit (INDEPENDENT_AMBULATORY_CARE_PROVIDER_SITE_OTHER)
Admission: RE | Admit: 2023-01-05 | Discharge: 2023-01-05 | Disposition: A | Discharge status: Home / Self Care | Payer: Medicare HMO | Source: Ambulatory Visit | Attending: Vascular Surgery | Admitting: Vascular Surgery

## 2023-01-05 ENCOUNTER — Ambulatory Visit (HOSPITAL_COMMUNITY)
Admission: RE | Admit: 2023-01-05 | Discharge: 2023-01-05 | Disposition: A | Discharge status: Home / Self Care | Payer: Medicare HMO | Source: Ambulatory Visit | Attending: Vascular Surgery | Admitting: Vascular Surgery

## 2023-01-05 DIAGNOSIS — I70212 Atherosclerosis of native arteries of extremities with intermittent claudication, left leg: Secondary | ICD-10-CM | POA: Diagnosis present

## 2023-01-05 DIAGNOSIS — I70213 Atherosclerosis of native arteries of extremities with intermittent claudication, bilateral legs: Secondary | ICD-10-CM | POA: Diagnosis present

## 2023-01-05 DIAGNOSIS — I739 Peripheral vascular disease, unspecified: Secondary | ICD-10-CM | POA: Insufficient documentation

## 2023-01-05 LAB — VAS US ABI WITH/WO TBI
Left ABI: 1.06
Right ABI: 0.95

## 2023-01-10 ENCOUNTER — Ambulatory Visit (INDEPENDENT_AMBULATORY_CARE_PROVIDER_SITE_OTHER): Payer: Medicare HMO | Admitting: Physician Assistant

## 2023-01-10 VITALS — BP 149/81 | HR 63 | Temp 98.2°F | Resp 18 | Ht 70.0 in | Wt 160.2 lb

## 2023-01-10 DIAGNOSIS — I70212 Atherosclerosis of native arteries of extremities with intermittent claudication, left leg: Secondary | ICD-10-CM | POA: Diagnosis not present

## 2023-01-10 DIAGNOSIS — I739 Peripheral vascular disease, unspecified: Secondary | ICD-10-CM

## 2023-01-10 NOTE — Progress Notes (Signed)
Office Note     CC:  follow up Requesting Provider:  Rebekah Chesterfield, NP  HPI: Stephen Sullivan is a 67 y.o. (Jul 09, 1956) male who presents status post aortogram with stenting of the left common and external iliac artery by Dr. Randie Heinz on 11/27/2022 due to lifestyle limiting claudication of the left lower extremity.  He tolerated the procedure well and states claudication has resolved.  He is able to walk any length of distance without stopping.  He continues to take Plavix daily.  He denies any ongoing pain or soreness in the right groin catheterization site.  He is on a statin daily as well.  He also has history of right SFA stents.  He denies any right leg claudication.  He continues to smoke about 5 cigarettes a day.   Past Medical History:  Diagnosis Date   Arthritis    Back pain    Cataracts, bilateral    immature   GERD (gastroesophageal reflux disease)    using Baking Soda and Water per pt   Headache(784.0)    related to neck issues   History of bronchitis    History of gastric ulcer 20+yrs ago   Hypertension    takes Benazepril some days   Joint pain    Muscle spasm    takes Flexeril daily   Shortness of breath    with exertion   Weakness    and numbness in left fingers    Past Surgical History:  Procedure Laterality Date   ABDOMINAL AORTOGRAM W/LOWER EXTREMITY N/A 10/18/2020   Procedure: ABDOMINAL AORTOGRAM W/LOWER EXTREMITY;  Surgeon: Maeola Harman, MD;  Location: [facility] INVASIVE CV LAB;  Service: Cardiovascular;  Laterality: N/A;   ABDOMINAL AORTOGRAM W/LOWER EXTREMITY N/A 11/27/2022   Procedure: ABDOMINAL AORTOGRAM W/LOWER EXTREMITY;  Surgeon: Maeola Harman, MD;  Location: [facility] INVASIVE CV LAB;  Service: Cardiovascular;  Laterality: N/A;   ANTERIOR CERVICAL DECOMP/DISCECTOMY FUSION N/A 12/18/2013   Procedure: ANTERIOR CERVICAL DECOMPRESSION/DISCECTOMY FUSION CERVICAL THREE-FOUR, FOUR-FIVE, FIVE-SIX W/ BONEGRAFT;  Surgeon: Hewitt Shorts, MD;   Location: [facility] NEURO ORS;  Service: Neurosurgery;  Laterality: N/A;  ANTERIOR CERVICAL DECOMPRESSION/DISCECTOMY FUSION CERVICAL THREE-FOUR, FOUR-FIVE, FIVE-SIX W/ BONEGRAFT   arm surgery Left    COLONOSCOPY WITH PROPOFOL N/A 11/15/2017   Procedure: COLONOSCOPY WITH PROPOFOL;  Surgeon: Corbin Ade, MD;  Location: [facility] ENDO SUITE;  Service: Endoscopy;  Laterality: N/A;  12:30pm   ESOPHAGOGASTRODUODENOSCOPY (EGD) WITH PROPOFOL N/A 10/05/2016   Procedure: ESOPHAGOGASTRODUODENOSCOPY (EGD) WITH PROPOFOL;  Surgeon: Corbin Ade, MD;  Location: [facility];  Service: Endoscopy;  Laterality: N/A;   ESOPHAGOGASTRODUODENOSCOPY (EGD) WITH PROPOFOL N/A 02/15/2017   Procedure: ESOPHAGOGASTRODUODENOSCOPY (EGD) WITH PROPOFOL;  Surgeon: Corbin Ade, MD;  Location: [facility];  Service: Endoscopy;  Laterality: N/A;  1115   HARDWARE REMOVAL Right 12/22/2021   Procedure: HARDWARE REMOVAL;  Surgeon: Oliver Barre, MD;  Location: [facility] ORS;  Service: Orthopedics;  Laterality: Right;   INGUINAL HERNIA REPAIR Right 08/18/2016   Procedure: RIGHT INGUINAL HERNORRHAPHY WITH MESH;  Surgeon: Franky Macho, MD;  Location: [facility] ORS;  Service: General;  Laterality: Right;   JOINT REPLACEMENT Right    hip    PERIPHERAL VASCULAR INTERVENTION Right 10/18/2020   Procedure: PERIPHERAL VASCULAR INTERVENTION;  Surgeon: Maeola Harman, MD;  Location: [facility] INVASIVE CV LAB;  Service: Cardiovascular;  Laterality: Right;   PERIPHERAL VASCULAR INTERVENTION Left 11/27/2022   Procedure: PERIPHERAL VASCULAR INTERVENTION;  Surgeon: Maeola Harman, MD;  Location: [facility] INVASIVE CV  LAB;  Service: Cardiovascular;  Laterality: Left;  L common iliac and L external iliac   POLYPECTOMY  11/15/2017   Procedure: POLYPECTOMY;  Surgeon: Corbin Ade, MD;  Location: [facility];  Service: Endoscopy;;  polyp cs splenic flexure times 2,  cecal polyp   REVERSE SHOULDER ARTHROPLASTY Right 12/22/2021   Procedure: REVERSE SHOULDER  ARTHROPLASTY;  Surgeon: Oliver Barre, MD;  Location: [facility] ORS;  Service: Orthopedics;  Laterality: Right;   SHOULDER SURGERY Right    x 3     Social History   Socioeconomic History   Marital status: Single    Spouse name: Not on file   Number of children: Not on file   Years of education: Not on file   Highest education level: Not on file  Occupational History   Occupation: disabled  Tobacco Use   Smoking status: Some Days    Packs/day: 0.10    Years: 47.00    Additional pack years: 0.00    Total pack years: 4.70    Types: Cigarettes    Passive exposure: Never   Smokeless tobacco: Never  Substance and Sexual Activity   Alcohol use: No   Drug use: Yes    Types: Marijuana   Sexual activity: Not Currently  Other Topics Concern   Not on file  Social History Narrative   Not on file   Social Determinants of Health   Financial Resource Strain: Not on file  Food Insecurity: Not on file  Transportation Needs: Not on file  Physical Activity: Not on file  Stress: Not on file  Social Connections: Not on file  Intimate Partner Violence: Not on file    Family History  Problem Relation Age of Onset   Prostate cancer Father    Prostate cancer Brother    Colon cancer Neg Hx     Current Outpatient Medications  Medication Sig Dispense Refill   amLODipine (NORVASC) 10 MG tablet Take 10 mg by mouth daily.     atorvastatin (LIPITOR) 20 MG tablet Take 20 mg by mouth daily.     clopidogrel (PLAVIX) 75 MG tablet TAKE 1 TABLET BY MOUTH EVERY DAY 90 tablet 3   gabapentin (NEURONTIN) 100 MG capsule Take 1 capsule (100 mg total) by mouth 3 (three) times daily. 90 capsule 0   HYDROcodone-acetaminophen (NORCO) 10-325 MG tablet Take 1 tablet by mouth every 8 (eight) hours as needed for moderate pain.     losartan-hydrochlorothiazide (HYZAAR) 50-12.5 MG tablet Take 1 tablet by mouth daily.     pantoprazole (PROTONIX) 40 MG tablet Take 1 tablet (40 mg total) by mouth daily. (Patient taking  differently: Take 40 mg by mouth 2 (two) times daily.) 90 tablet 3   No current facility-administered medications for this visit.   Facility-Administered Medications Ordered in Other Visits  Medication Dose Route Frequency Provider Last Rate Last Admin   fentaNYL (SUBLIMAZE) injection    PRN Maeola Harman, MD   50 mcg at 11/27/22 0920    Allergies  Allergen Reactions   Ibuprofen Other (See Comments)    Causes bleeding   Nsaids Other (See Comments)    Not to take - "busted intestines open"     REVIEW OF SYSTEMS:   [X]  denotes positive finding, [ ]  denotes negative finding Cardiac  Comments:  Chest pain or chest pressure:    Shortness of breath upon exertion:    Short of breath when lying flat:    Irregular heart rhythm:  Vascular    Pain in calf, thigh, or hip brought on by ambulation:    Pain in feet at night that wakes you up from your sleep:     Blood clot in your veins:    Leg swelling:         Pulmonary    Oxygen at home:    Productive cough:     Wheezing:         Neurologic    Sudden weakness in arms or legs:     Sudden numbness in arms or legs:     Sudden onset of difficulty speaking or slurred speech:    Temporary loss of vision in one eye:     Problems with dizziness:         Gastrointestinal    Blood in stool:     Vomited blood:         Genitourinary    Burning when urinating:     Blood in urine:        Psychiatric    Major depression:         Hematologic    Bleeding problems:    Problems with blood clotting too easily:        Skin    Rashes or ulcers:        Constitutional    Fever or chills:      PHYSICAL EXAMINATION:  Vitals:   01/10/23 0955  BP: (!) 149/81  Pulse: 63  Resp: 18  Temp: 98.2 F (36.8 C)  TempSrc: Temporal  SpO2: 99%  Weight: 160 lb 3.2 oz (72.7 kg)  Height: 5\' 10"  (1.778 m)    General:  WDWN in NAD; vital signs documented above Gait: Not observed HENT: WNL, normocephalic Pulmonary: normal  non-labored breathing , without Rales, rhonchi,  wheezing Cardiac: regular HR Abdomen: soft, NT, no masses Skin: without rashes Vascular Exam/Pulses: Symmetrical DP pulses Extremities: without ischemic changes, without Gangrene , without cellulitis; without open wounds;  Musculoskeletal: no muscle wasting or atrophy  Neurologic: A&O X 3 Psychiatric:  The pt has Normal affect.   Non-Invasive Vascular Imaging:   Widely patent left common and external iliac artery stents  ABI/TBIToday's ABIToday's TBIPrevious ABIPrevious TBI  +-------+-----------+-----------+------------+------------+  Right 0.95       0.00       1.16        0.00          +-------+-----------+-----------+------------+------------+  Left  1.06       0.00       0.94        0.55             ASSESSMENT/PLAN:: 67 y.o. male status post left common and external iliac artery stenting due to lifestyle-limiting claudication  -Claudication of the left lower extremity has completely resolved.  He has symmetrical DP pulses bilaterally.  Duplex demonstrates a widely patent iliac stents.  He also continues to be without claudication of the right lower extremity since SFA stenting in the past.  He will continue his Plavix and statin daily.  I have encouraged him to stop smoking.  We will repeat aortoiliac duplex and right leg arterial duplex with ABIs in 6 months.  He will call/return office sooner with any symptom return.   Emilie Rutter, PA-C [facility] [phone]  Clinic MD:   Randie Heinz

## 2023-01-11 ENCOUNTER — Other Ambulatory Visit: Payer: Self-pay

## 2023-01-11 DIAGNOSIS — I70213 Atherosclerosis of native arteries of extremities with intermittent claudication, bilateral legs: Secondary | ICD-10-CM

## 2023-01-11 DIAGNOSIS — I739 Peripheral vascular disease, unspecified: Secondary | ICD-10-CM

## 2023-03-20 ENCOUNTER — Encounter: Payer: Self-pay | Admitting: Orthopedic Surgery

## 2023-03-20 ENCOUNTER — Ambulatory Visit (INDEPENDENT_AMBULATORY_CARE_PROVIDER_SITE_OTHER): Payer: Medicare HMO | Admitting: Orthopedic Surgery

## 2023-03-20 ENCOUNTER — Other Ambulatory Visit (INDEPENDENT_AMBULATORY_CARE_PROVIDER_SITE_OTHER): Payer: Medicare HMO

## 2023-03-20 DIAGNOSIS — M25512 Pain in left shoulder: Secondary | ICD-10-CM

## 2023-03-20 NOTE — Progress Notes (Signed)
Orthopaedic Clinic Return  Assessment: Stephen Sullivan is a 67 y.o. male with the following: Acute left arm pain Plan: Since the onset of pain in the left arm, it has gradually resolved.  No specific injury.  At onset, he states his arm felt heavy.  Pain was excruciating.  However it is better.  His motion is coming back.  Based on his description, I am concerned that he may have irritated some nerves in the neck, which is resulted in some of his discomfort.  Nonetheless, nothing to do at this time.  Continue to monitor.  If he has further issues, he will return to clinic.  I have also told him to call the clinic if his symptoms return, and we can try a prednisone Dosepak.  Follow-up: Return if symptoms worsen or fail to improve.   Subjective:  Chief Complaint  Patient presents with   Shoulder Pain    L shoulder     History of Present Illness: Stephen Sullivan is a 67 y.o. male who returns to clinic for evaluation of left arm pain.  He is well-known to my clinic.  Approximately 3 weeks ago, he states that he woke up, and had excruciating pain in the left arm.  He had very little motion and use of the left arm.  He is gradually getting better.  He saw his PCP, and had an IM steroid injection, which improved his symptoms for about 4 hours.  He is now able to get his arm above the level of the shoulder.  Review of Systems: No fevers or chills No numbness or tingling No chest pain No shortness of breath No bowel or bladder dysfunction No GI distress No headaches   Objective: There were no vitals taken for this visit.  Physical Exam:  Alert and oriented.  No acute distress.  No numbness or tingling.  Forward flexion to 120 degrees.  Internal rotation of the back pocket.  Fingers are warm and well-perfused.  IMAGING: I personally ordered and reviewed the following images:  X-rays of the left shoulder were obtained in clinic today.  No acute injuries are noted.  Minimal  degenerative changes within the glenohumeral joint space.  No evidence of proximal humeral migration.  There is calcification at the footprint of the rotator cuff.  No bony lesions.  Impression: Left shoulder x-rays without acute injury   Oliver Barre, MD 03/20/2023 1:19 PM

## 2023-03-20 NOTE — Progress Notes (Signed)
3 weeks ago he woke up and had a awful pain it stayed like that for about 9 days.  Pain was awful. The last 3-4 days he is able to use it again and the pain isn't as bad.  Unknown injury.  He did have a shot in his shoulder from his PCP he stated it lasted about 4 ish hours.

## 2023-07-04 ENCOUNTER — Ambulatory Visit (HOSPITAL_COMMUNITY)
Admission: RE | Admit: 2023-07-04 | Discharge: 2023-07-04 | Disposition: A | Discharge status: Home / Self Care | Payer: Medicare HMO | Source: Ambulatory Visit | Attending: Vascular Surgery | Admitting: Vascular Surgery

## 2023-07-04 ENCOUNTER — Ambulatory Visit (HOSPITAL_COMMUNITY)
Admission: RE | Admit: 2023-07-04 | Discharge: 2023-07-04 | Discharge status: Home / Self Care | Payer: Medicare HMO | Source: Ambulatory Visit | Attending: Vascular Surgery

## 2023-07-04 ENCOUNTER — Ambulatory Visit (INDEPENDENT_AMBULATORY_CARE_PROVIDER_SITE_OTHER): Payer: Medicare HMO | Admitting: Physician Assistant

## 2023-07-04 ENCOUNTER — Ambulatory Visit (INDEPENDENT_AMBULATORY_CARE_PROVIDER_SITE_OTHER)
Admission: RE | Admit: 2023-07-04 | Discharge: 2023-07-04 | Disposition: A | Discharge status: Home / Self Care | Payer: Medicare HMO | Source: Ambulatory Visit | Attending: Vascular Surgery | Admitting: Vascular Surgery

## 2023-07-04 VITALS — BP 159/85 | HR 58 | Temp 98.4°F | Resp 18 | Ht 70.0 in | Wt 168.3 lb

## 2023-07-04 DIAGNOSIS — I739 Peripheral vascular disease, unspecified: Secondary | ICD-10-CM

## 2023-07-04 DIAGNOSIS — I70213 Atherosclerosis of native arteries of extremities with intermittent claudication, bilateral legs: Secondary | ICD-10-CM

## 2023-07-04 LAB — VAS US ABI WITH/WO TBI
Left ABI: 1.22
Right ABI: 1.11

## 2023-07-04 NOTE — Progress Notes (Signed)
Office Note     CC:  follow up Requesting Provider:  Rebekah Chesterfield, NP  HPI: Stephen Sullivan is a 67 y.o. (04-Jun-1956) male who presents for routine follow up of PAD. He has history of lifestyle limiting LLE claudication requiring stenting of left common and external iliac artery by Dr. Randie Heinz in April of 2024. Post intervention his symptoms resolved. He also has history of right SFA angioplasty and stenting in March of 2022. He has been without any discomfort on ambulation or rest and no tissue loss.  Today he reports 2 weeks ago that he experienced a sharp stabbing pain in his right groin. This lasted 2 days and then went away. He says it just stayed in the right groin area in region he previously had hernia. He denies any changes in his activity or heavy lifting. Does not recall any tearing or pulling sensation. He says he was going to go to ER if it persisted any longer. Since he says he had small twinge of pain in same area about 5 days ago that lasted 5 minutes but it went away. He otherwise denies any pain in his legs on ambulation or rest. No tissue loss. He is medically managed on Plavix, Aspirin and Statin. He continues to smokes 5-6 cigarettes a day   Past Medical History:  Diagnosis Date   Arthritis    Back pain    Cataracts, bilateral    immature   GERD (gastroesophageal reflux disease)    using Baking Soda and Water per pt   Headache(784.0)    related to neck issues   History of bronchitis    History of gastric ulcer 20+yrs ago   Hypertension    takes Benazepril some days   Joint pain    Muscle spasm    takes Flexeril daily   Shortness of breath    with exertion   Weakness    and numbness in left fingers    Past Surgical History:  Procedure Laterality Date   ABDOMINAL AORTOGRAM W/LOWER EXTREMITY N/A 10/18/2020   Procedure: ABDOMINAL AORTOGRAM W/LOWER EXTREMITY;  Surgeon: Maeola Harman, MD;  Location: [facility] INVASIVE CV LAB;  Service: Cardiovascular;   Laterality: N/A;   ABDOMINAL AORTOGRAM W/LOWER EXTREMITY N/A 11/27/2022   Procedure: ABDOMINAL AORTOGRAM W/LOWER EXTREMITY;  Surgeon: Maeola Harman, MD;  Location: [facility] INVASIVE CV LAB;  Service: Cardiovascular;  Laterality: N/A;   ANTERIOR CERVICAL DECOMP/DISCECTOMY FUSION N/A 12/18/2013   Procedure: ANTERIOR CERVICAL DECOMPRESSION/DISCECTOMY FUSION CERVICAL THREE-FOUR, FOUR-FIVE, FIVE-SIX W/ BONEGRAFT;  Surgeon: Hewitt Shorts, MD;  Location: [facility]URO [facility]S;  Service: Neurosurgery;  Laterality: N/A;  ANTERIOR CERVICAL DECOMPRESSION/DISCECTOMY FUSION CERVICAL THREE-FOUR, FOUR-FIVE, FIVE-SIX W/ BONEGRAFT   arm surgery Left    COLONOSCOPY WITH PROPOFOL N/A 11/15/2017   Procedure: COLONOSCOPY WITH PROPOFOL;  Surgeon: Corbin Ade, MD;  [facility]: [facility];  Service: Endoscopy;  Laterality: N/A;  12:30pm   ESOPHAGOGASTRODUODENOSCOPY (EGD) WITH PROPOFOL N/A 10/05/2016   Procedure: ESOPHAGOGASTRODUODENOSCOPY (EGD) WITH PROPOFOL;  Surgeon: Corbin Ade, MD;  [facility]: [facility];  Service: Endoscopy;  Laterality: N/A;   ESOPHAGOGASTRODUODENOSCOPY (EGD) WITH PROPOFOL N/A 02/15/2017   Procedure: ESOPHAGOGASTRODUODENOSCOPY (EGD) WITH PROPOFOL;  Surgeon: Corbin Ade, MD;  [facility]: [facility];  Service: Endoscopy;  Laterality: N/A;  1115   HARDWARE REMOVAL Right 12/22/2021   Procedure: HARDWARE REMOVAL;  Surgeon: Oliver Barre, MD;  Location: [facility];  Service: Orthopedics;  Laterality: Right;   INGUINAL HERNIA REPAIR Right 08/18/2016  Procedure: RIGHT INGUINAL HERNORRHAPHY WITH MESH;  Surgeon: Franky Macho, MD;  Location: [facility];  Service: General;  Laterality: Right;   JOINT REPLACEMENT Right    hip    PERIPHERAL VASCULAR INTERVENTION Right 10/18/2020   Procedure: PERIPHERAL VASCULAR INTERVENTION;  Surgeon: Maeola Harman, MD;  Location: [facility] INVASIVE CV LAB;  Service: Cardiovascular;  Laterality: Right;   PERIPHERAL VASCULAR INTERVENTION Left 11/27/2022   Procedure:  PERIPHERAL VASCULAR INTERVENTION;  Surgeon: Maeola Harman, MD;  Location: [facility] INVASIVE CV LAB;  Service: Cardiovascular;  Laterality: Left;  L common iliac and L external iliac   POLYPECTOMY  11/15/2017   Procedure: POLYPECTOMY;  Surgeon: Corbin Ade, MD;  [facility]: [facility];  Service: Endoscopy;;  polyp cs splenic flexure times 2,  cecal polyp   REVERSE SHOULDER ARTHROPLASTY Right 12/22/2021   Procedure: REVERSE SHOULDER ARTHROPLASTY;  Surgeon: Oliver Barre, MD;  Location: [facility];  Service: Orthopedics;  Laterality: Right;   SHOULDER SURGERY Right    x 3     Social History   Socioeconomic History   Marital status: Single    Spouse name: Not on file   Number of children: Not on file   Years of education: Not on file   Highest education level: Not on file  Occupational History   Occupation: disabled  Tobacco Use   Smoking status: Some Days    Current packs/day: 0.10    Average packs/day: 0.1 packs/day for 47.0 years (4.7 ttl pk-yrs)    Types: Cigarettes    Passive exposure: Never   Smokeless tobacco: Never  Substance and Sexual Activity   Alcohol use: No   Drug use: Yes    Types: Marijuana   Sexual activity: Not Currently  Other Topics Concern   Not on file  Social History Narrative   Not on file   Social Determinants of Health   Financial Resource Strain: Not on file  Food Insecurity: Not on file  Transportation Needs: Not on file  Physical Activity: Not on file  Stress: Not on file  Social Connections: Not on file  Intimate Partner Violence: Not on file   Family History  Problem Relation Age of Onset   Prostate cancer Father    Prostate cancer Brother    Colon cancer Neg Hx     Current Outpatient Medications  Medication Sig Dispense Refill   amLODipine (NORVASC) 10 MG tablet Take 10 mg by mouth daily.     atorvastatin (LIPITOR) 20 MG tablet Take 20 mg by mouth daily.     HYDROcodone-acetaminophen (NORCO) 10-325 MG tablet Take 1 tablet by  mouth every 8 (eight) hours as needed for moderate pain.     losartan-hydrochlorothiazide (HYZAAR) 50-12.5 MG tablet Take 1 tablet by mouth daily.     pantoprazole (PROTONIX) 40 MG tablet Take 1 tablet (40 mg total) by mouth daily. (Patient taking differently: Take 40 mg by mouth 2 (two) times daily.) 90 tablet 3   clopidogrel (PLAVIX) 75 MG tablet TAKE 1 TABLET BY MOUTH EVERY DAY (Patient not taking: Reported on 07/04/2023) 90 tablet 3   gabapentin (NEURONTIN) 100 MG capsule Take 1 capsule (100 mg total) by mouth 3 (three) times daily. (Patient not taking: Reported on 07/04/2023) 90 capsule 0   No current facility-administered medications for this visit.   Facility-Administered Medications Ordered in Other Visits  Medication Dose Route Frequency Provider Last Rate Last Admin   fentaNYL (SUBLIMAZE) injection    PRN Maeola Harman, MD   50  mcg at 11/27/22 0920    Allergies  Allergen Reactions   Ibuprofen Other (See Comments)    Causes bleeding   Nsaids Other (See Comments)    Not to take - "busted intestines open"     REVIEW OF SYSTEMS:  [X]  denotes positive finding, [ ]  denotes negative finding Cardiac  Comments:  Chest pain or chest pressure:    Shortness of breath upon exertion:    Short of breath when lying flat:    Irregular heart rhythm:        Vascular    Pain in calf, thigh, or hip brought on by ambulation:    Pain in feet at night that wakes you up from your sleep:     Blood clot in your veins:    Leg swelling:         Pulmonary    Oxygen at home:    Productive cough:     Wheezing:         Neurologic    Sudden weakness in arms or legs:     Sudden numbness in arms or legs:     Sudden onset of difficulty speaking or slurred speech:    Temporary loss of vision in one eye:     Problems with dizziness:         Gastrointestinal    Blood in stool:     Vomited blood:         Genitourinary    Burning when urinating:     Blood in urine:         Psychiatric    Major depression:         Hematologic    Bleeding problems:    Problems with blood clotting too easily:        Skin    Rashes or ulcers:        Constitutional    Fever or chills:      PHYSICAL EXAMINATION:  Vitals:   07/04/23 0853  Resp: 18  Temp: 98.4 F (36.9 C)  TempSrc: Temporal  SpO2: 98%  Weight: 168 lb 4.8 oz (76.3 kg)  Height: 5\' 10"  (1.778 m)    General:  WDWN in NAD; vital signs documented above Gait: Not observed HENT: WNL, normocephalic Pulmonary: normal non-labored breathing , without Rales, rhonchi,  wheezing Cardiac: regular HR Abdomen: soft, NT, no masses Vascular Exam/Pulses: 2+ femoral, 2+ DP pulses bilaterally. Feet warm and well perfused Extremities: without ischemic changes, without Gangrene , without cellulitis; without open wounds;  Musculoskeletal: no muscle wasting or atrophy  Neurologic: A&O X 3 Psychiatric:  The pt has Normal affect.   Non-Invasive Vascular Imaging:   +-------+-----------+-----------+------------+------------+  ABI/TBIToday's ABIToday's TBIPrevious ABIPrevious TBI  +-------+-----------+-----------+------------+------------+  Right 1.11       absent     0.95        absent        +-------+-----------+-----------+------------+------------+  Left  1.22       0.46       1.06        absent        +-------+-----------+-----------+------------+------------+   VAS Aorto/ iliac Duplex: Abdominal Aorta Findings:  +-------------+-------+----------+----------+--------+--------+--------+  Location    AP (cm)Trans (cm)PSV (cm/s)WaveformThrombusComments  +-------------+-------+----------+----------+--------+--------+--------+  Distal                       81                                  +-------------+-------+----------+----------+--------+--------+--------+  RT CIA Prox                   75                                   +-------------+-------+----------+----------+--------+--------+--------+  RT CIA Mid                    81                                  +-------------+-------+----------+----------+--------+--------+--------+  RT CIA Distal                 75                                  +-------------+-------+----------+----------+--------+--------+--------+  RT EIA Prox                   170                                 +-------------+-------+----------+----------+--------+--------+--------+  RT EIA Mid                    170                                 +-------------+-------+----------+----------+--------+--------+--------+  RT EIA Distal                 295                                 +-------------+-------+----------+----------+--------+--------+--------+  LT CIA Prox                   81                        stent     +-------------+-------+----------+----------+--------+--------+--------+  LT CIA Mid                    123                       stent     +-------------+-------+----------+----------+--------+--------+--------+  LT CIA Distal                 147                       stent     +-------------+-------+----------+----------+--------+--------+--------+  LT EIA Prox                   181                       stent     +-------------+-------+----------+----------+--------+--------+--------+  LT EIA Mid                    92                        stent     +-------------+-------+----------+----------+--------+--------+--------+  LT EIA Distal  144                       stent     +-------------+-------+----------+----------+--------+--------+--------+     Summary:  Stenosis: +--------------------+-------------+-----------+  Location            Stenosis     Stent        +--------------------+-------------+-----------+  Left Common Iliac                no  stenosis  +--------------------+-------------+-----------+  Right External Iliac>50% stenosis             +--------------------+-------------+-----------+  Left External Iliac              no stenosis  +--------------------+-------------+-----------+   VAS [place] lower extremity arterial duplex: +----------+--------+-----+--------+--------+--------+  RIGHT    PSV cm/sRatioStenosisWaveformComments  +----------+--------+-----+--------+--------+--------+  CFA Distal115                  biphasic          +----------+--------+-----+--------+--------+--------+  SFA Prox  80                   biphasic          +----------+--------+-----+--------+--------+--------+  POP Prox  59                                     +----------+--------+-----+--------+--------+--------+   Right Stent(s):  +---------------+--------+--------------+--------+--------+  SFA           PSV cm/sStenosis      WaveformComments  +---------------+--------+--------------+--------+--------+  Prox to Stent  132                                     +---------------+--------+--------------+--------+--------+  Proximal Stent 146     1-49% stenosis                  +---------------+--------+--------------+--------+--------+  Mid Stent      57                                      +---------------+--------+--------------+--------+--------+  Distal Stent   57                                      +---------------+--------+--------------+--------+--------+  Distal to Stent112                                     +---------------+--------+--------------+--------+--------+   Summary:  Right: Patent SFA stent with mildly elevated velocity demonstrated in the proximal stent suggesting a 1-49% stenosis.   ASSESSMENT/PLAN:: 67 y.o. male here for routine follow up of PAD. He has history of lifestyle limiting LLE claudication requiring stenting of left common and external  iliac artery by Dr. Randie Heinz in April of 2024. Post intervention his symptoms resolved. He also has history of right SFA angioplasty and stenting in March of 2022. He is without any discomfort on ambulation or rest and no tissue loss. He did have recent episode of right groin pain. This has since resolved. He was tender to palpation in are of prior hernia repair so I  suspect this is related. I recommended that he follow up with PCP or surgeon should he have recurrence. I also educated him on trying to avoid lifting or straining. Otherwise his ABIs are improved bilaterally. His duplex today show Patient iliac stents and Right SFA stent. He does have some elevated velocity in the right EIA but would not recommend any intervention at this time. - Encourage smoking cessation - Continue Plavix, Aspirin, Statin - He will follow up again in 6 months with Aorto iliac duplex, RLE duplex and ABIs    Graceann Congress, PA-C [facility] [phone]  Clinic MD:  Randie Heinz

## 2023-07-10 ENCOUNTER — Other Ambulatory Visit: Payer: Self-pay

## 2023-07-10 DIAGNOSIS — I739 Peripheral vascular disease, unspecified: Secondary | ICD-10-CM

## 2023-07-10 DIAGNOSIS — I70213 Atherosclerosis of native arteries of extremities with intermittent claudication, bilateral legs: Secondary | ICD-10-CM

## 2023-07-12 ENCOUNTER — Encounter (HOSPITAL_COMMUNITY): Payer: Medicare HMO

## 2023-07-12 ENCOUNTER — Ambulatory Visit: Payer: Medicare HMO

## 2023-07-13 IMAGING — DX DG SHOULDER 2+V*R*
3 series · 3 of 3 positions shown · non-contrast
Comparison: None.

CLINICAL DATA: Right shoulder pain

EXAM:
RIGHT SHOULDER - 2+ VIEW

[shoulder grashey]
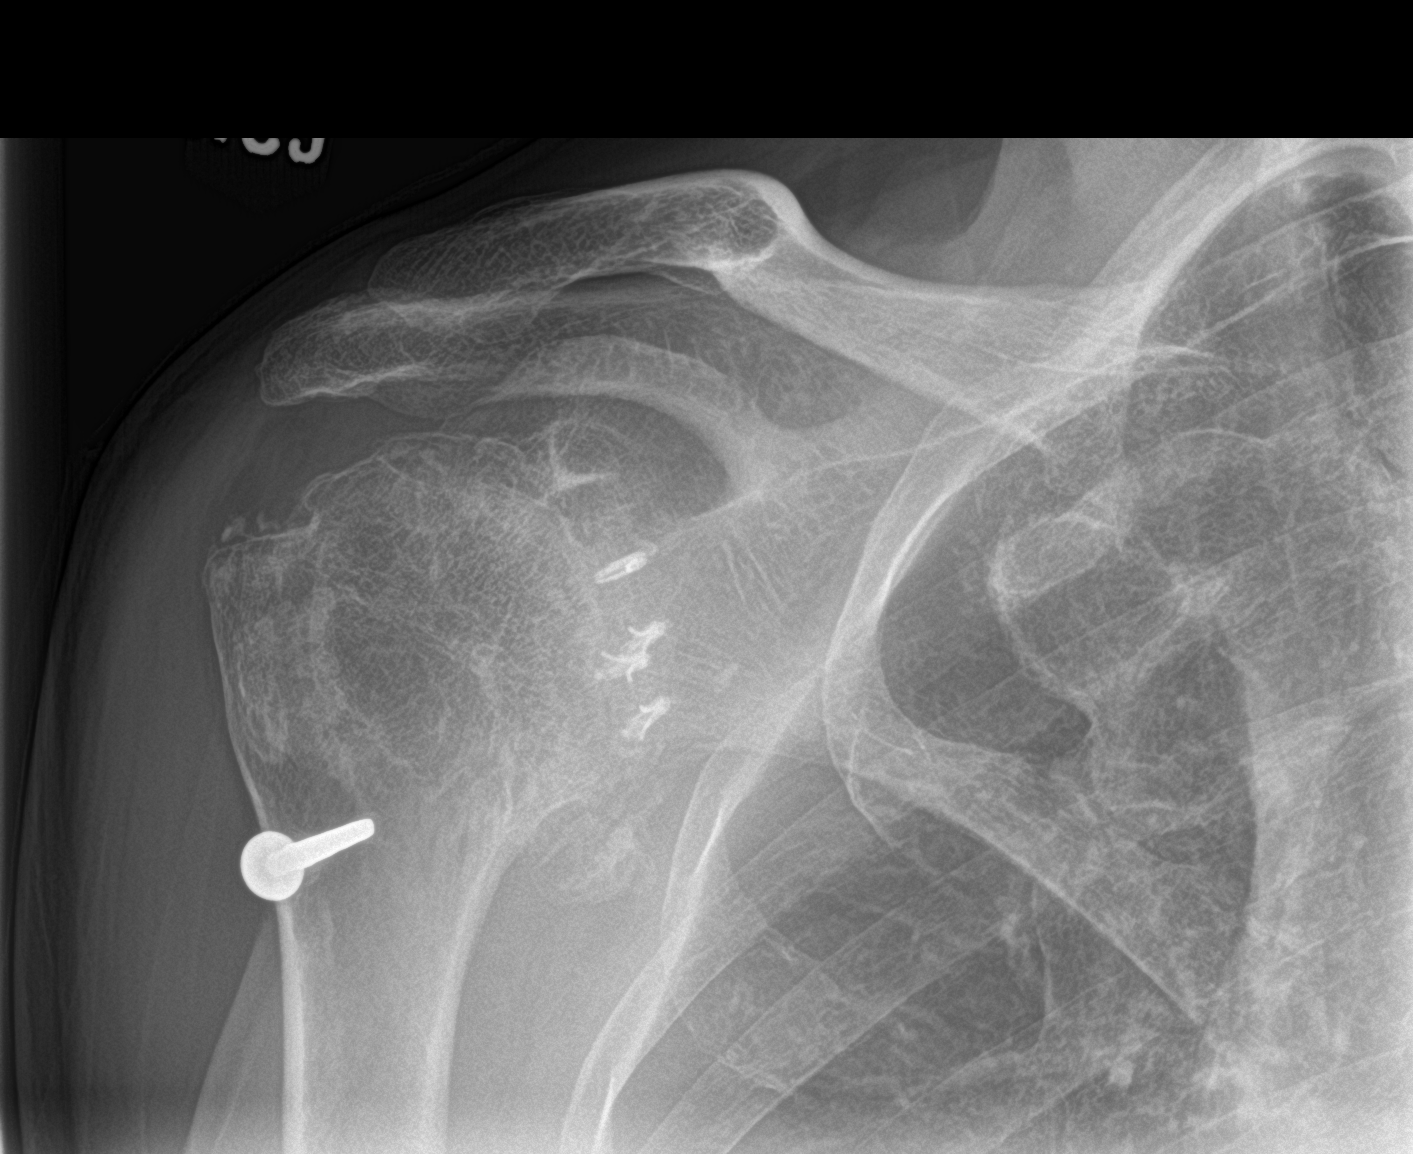

[shoulder y view]
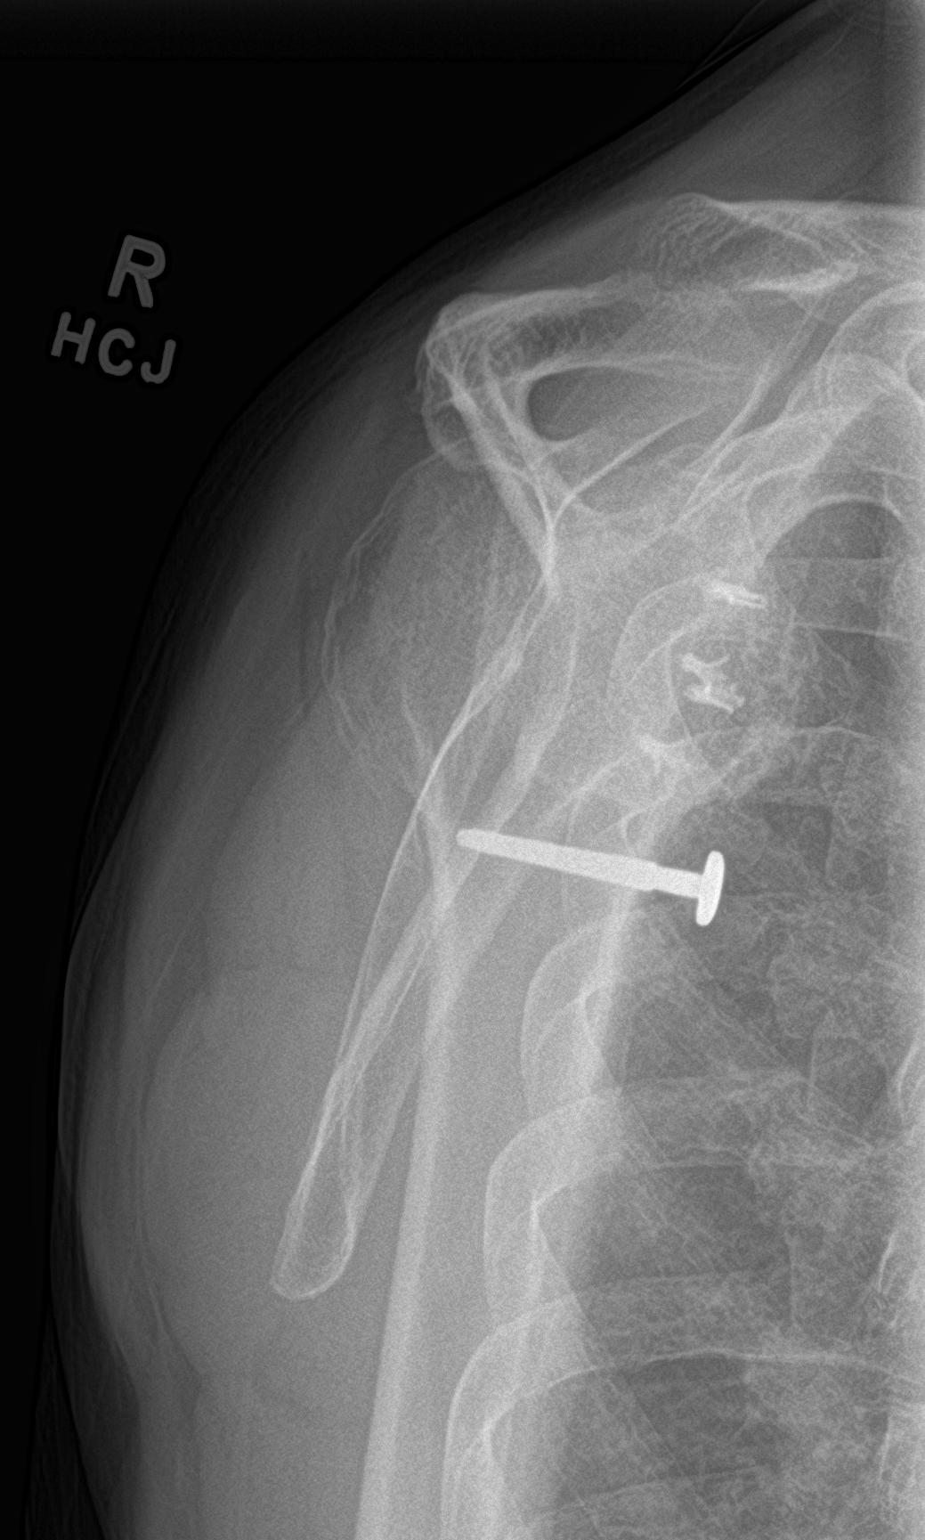

[shoulder axillary]
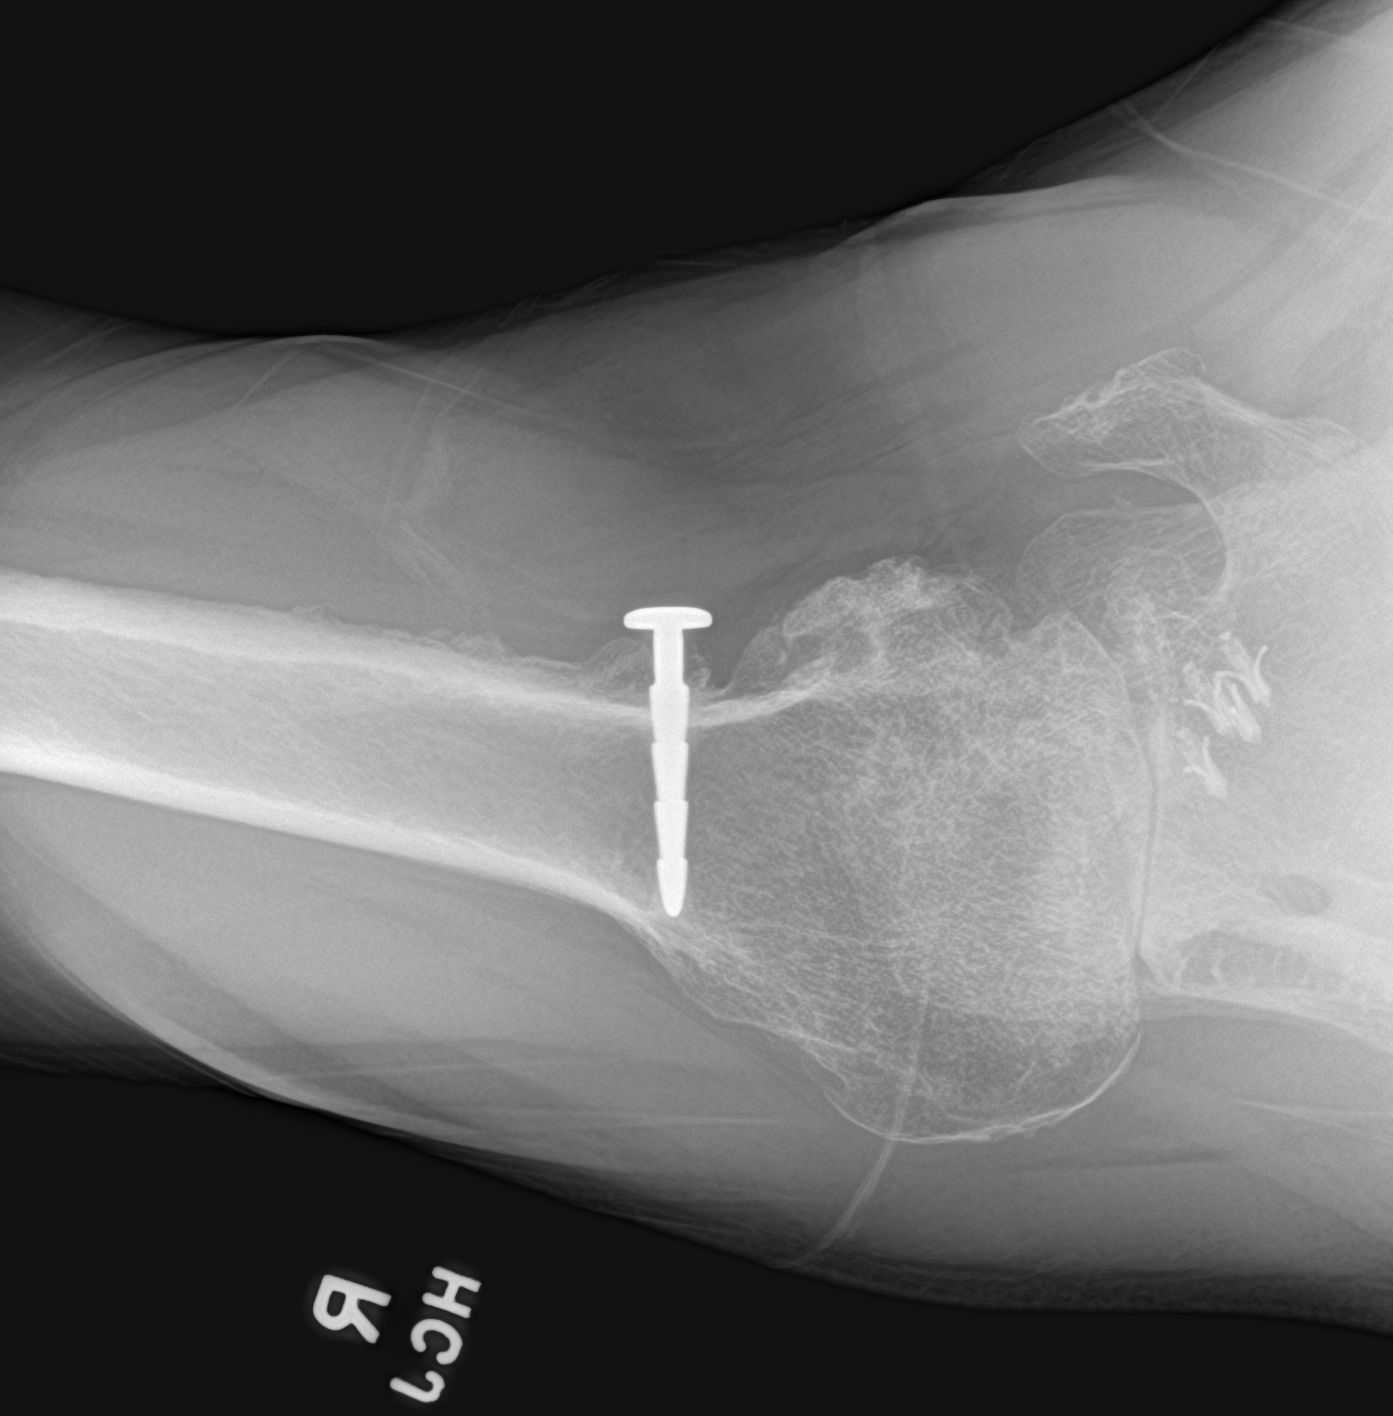

[3 of 3 positions shown; findings below may reference images not displayed]

FINDINGS: No fracture or dislocation of the right shoulder. There is severe,
bone-on-bone arthrosis of the glenohumeral joint. Anchors are
present in the humeral neck and anterior glenoid.
IMPRESSION: No fracture or dislocation of the right shoulder. There is severe,
bone-on-bone arthrosis of the glenohumeral joint.

## 2023-10-18 ENCOUNTER — Other Ambulatory Visit: Payer: Self-pay | Admitting: Physician Assistant

## 2024-01-02 ENCOUNTER — Ambulatory Visit (HOSPITAL_COMMUNITY): Payer: Medicare HMO

## 2024-01-02 ENCOUNTER — Ambulatory Visit: Payer: Medicare HMO | Attending: Internal Medicine

## 2024-01-02 ENCOUNTER — Ambulatory Visit (HOSPITAL_COMMUNITY): Payer: Medicare HMO | Attending: Vascular Surgery

## 2024-01-04 ENCOUNTER — Ambulatory Visit: Payer: Medicare HMO | Admitting: Orthopedic Surgery

## 2024-01-13 NOTE — Progress Notes (Unsigned)
 GI Office Note    Referring Provider: Lenn Quint, NP Primary Care Physician:  Lenn Quint, NP  Primary Gastroenterologist: Windsor Hatcher.Rourk, MD  Chief Complaint   No chief complaint on file.   History of Present Illness   Stephen Sullivan is a 68 y.o. male presenting today at the request of Lenn Quint, NP for dysphagia.  EGD March 2018: - ***  EGD July 2018: - ***  Colonoscopy April 2019: - *** - Path: tubular adenomas  Last OV 10/08/18. ***    Today:    Wt Readings from Last 3 Encounters:  07/04/23 168 lb 4.8 oz (76.3 kg)  01/10/23 160 lb 3.2 oz (72.7 kg)  12/29/22 161 lb (73 kg)    Current Outpatient Medications  Medication Sig Dispense Refill   amLODipine  (NORVASC ) 10 MG tablet Take 10 mg by mouth daily.     atorvastatin  (LIPITOR) 20 MG tablet Take 20 mg by mouth daily.     clopidogrel  (PLAVIX ) 75 MG tablet TAKE 1 TABLET BY MOUTH EVERY DAY 90 tablet 3   gabapentin  (NEURONTIN ) 100 MG capsule Take 1 capsule (100 mg total) by mouth 3 (three) times daily. (Patient not taking: Reported on 07/04/2023) 90 capsule 0   HYDROcodone -acetaminophen  (NORCO) 10-325 MG tablet Take 1 tablet by mouth every 8 (eight) hours as needed for moderate pain.     losartan -hydrochlorothiazide  (HYZAAR) 50-12.5 MG tablet Take 1 tablet by mouth daily.     pantoprazole  (PROTONIX ) 40 MG tablet Take 1 tablet (40 mg total) by mouth daily. (Patient taking differently: Take 40 mg by mouth 2 (two) times daily.) 90 tablet 3   No current facility-administered medications for this visit.   Facility-Administered Medications Ordered in Other Visits  Medication Dose Route Frequency Provider Last Rate Last Admin   fentaNYL  (SUBLIMAZE ) injection    PRN Adine Hoof, MD   50 mcg at 11/27/22 0920    Past Medical History:  Diagnosis Date   Arthritis    Back pain    Cataracts, bilateral    immature   GERD (gastroesophageal reflux disease)    using Baking Soda and  Water  per pt   Headache(784.0)    related to neck issues   History of bronchitis    History of gastric ulcer 20+yrs ago   Hypertension    takes Benazepril  some days   Joint pain    Muscle spasm    takes Flexeril  daily   Shortness of breath    with exertion   Weakness    and numbness in left fingers    Past Surgical History:  Procedure Laterality Date   ABDOMINAL AORTOGRAM W/LOWER EXTREMITY N/A 10/18/2020   Procedure: ABDOMINAL AORTOGRAM W/LOWER EXTREMITY;  Surgeon: Adine Hoof, MD;  Location: [facility] INVASIVE CV LAB;  Service: Cardiovascular;  Laterality: N/A;   ABDOMINAL AORTOGRAM W/LOWER EXTREMITY N/A 11/27/2022   Procedure: ABDOMINAL AORTOGRAM W/LOWER EXTREMITY;  Surgeon: Adine Hoof, MD;  Location: [facility] INVASIVE CV LAB;  Service: Cardiovascular;  Laterality: N/A;   ANTERIOR CERVICAL DECOMP/DISCECTOMY FUSION N/A 12/18/2013   Procedure: ANTERIOR CERVICAL DECOMPRESSION/DISCECTOMY FUSION CERVICAL THREE-FOUR, FOUR-FIVE, FIVE-SIX W/ BONEGRAFT;  Surgeon: Corrina Dimitri, MD;  Location: [facility];  Service: Neurosurgery;  Laterality: N/A;  ANTERIOR CERVICAL DECOMPRESSION/DISCECTOMY FUSION CERVICAL THREE-FOUR, FOUR-FIVE, FIVE-SIX W/ BONEGRAFT   arm surgery Left    COLONOSCOPY WITH PROPOFOL  N/A 11/15/2017   Procedure: COLONOSCOPY WITH PROPOFOL ;  Surgeon: Suzette Espy, MD;  [facility];  Service: Endoscopy;  Laterality: N/A;  12:30pm   ESOPHAGOGASTRODUODENOSCOPY (EGD) WITH PROPOFOL  N/A 10/05/2016   Procedure: ESOPHAGOGASTRODUODENOSCOPY (EGD) WITH PROPOFOL ;  Surgeon: Suzette Espy, MD;  [facility];  Service: Endoscopy;  Laterality: N/A;   ESOPHAGOGASTRODUODENOSCOPY (EGD) WITH PROPOFOL  N/A 02/15/2017   Procedure: ESOPHAGOGASTRODUODENOSCOPY (EGD) WITH PROPOFOL ;  Surgeon: Suzette Espy, MD;  [facility]: [facility];  Service: Endoscopy;  Laterality: N/A;  1115   HARDWARE REMOVAL Right 12/22/2021   Procedure: HARDWARE REMOVAL;  Surgeon: Tonita Frater, MD;  Location: [facility];  Service: Orthopedics;  Laterality: Right;   INGUINAL HERNIA REPAIR Right 08/18/2016   Procedure: RIGHT INGUINAL HERNORRHAPHY WITH MESH;  Surgeon: Alanda Allegra, MD;  Location: [facility];  Service: General;  Laterality: Right;   JOINT REPLACEMENT Right    hip    PERIPHERAL VASCULAR INTERVENTION Right 10/18/2020   Procedure: PERIPHERAL VASCULAR INTERVENTION;  Surgeon: Adine Hoof, MD;  Location: [facility] INVASIVE CV LAB;  Service: Cardiovascular;  Laterality: Right;   PERIPHERAL VASCULAR INTERVENTION Left 11/27/2022   Procedure: PERIPHERAL VASCULAR INTERVENTION;  Surgeon: Adine Hoof, MD;  Location: [facility] INVASIVE CV LAB;  Service: Cardiovascular;  Laterality: Left;  L common iliac and L external iliac   POLYPECTOMY  11/15/2017   Procedure: POLYPECTOMY;  Surgeon: Suzette Espy, MD;  Location: [facility];  Service: Endoscopy;;  polyp cs splenic flexure times 2,  cecal polyp   REVERSE SHOULDER ARTHROPLASTY Right 12/22/2021   Procedure: REVERSE SHOULDER ARTHROPLASTY;  Surgeon: Tonita Frater, MD;  Location: [facility];  Service: Orthopedics;  Laterality: Right;   SHOULDER SURGERY Right    x 3     Family History  Problem Relation Age of Onset   Prostate cancer Father    Prostate cancer Brother    Colon cancer Neg Hx     Allergies as of 01/14/2024 - Review Complete 07/04/2023  Allergen Reaction Noted   Ibuprofen  Other (See Comments) 08/17/2021   Nsaids Other (See Comments) 11/02/2016    Social History   Socioeconomic History   Marital status: Single    Spouse name: Not on file   Number of children: Not on file   Years of education: Not on file   Highest education level: Not on file  Occupational History   Occupation: disabled  Tobacco Use   Smoking status: Some Days    Current packs/day: 0.10    Average packs/day: 0.1 packs/day for 47.0 years (4.7 ttl pk-yrs)    Types: Cigarettes    Passive exposure: Never   Smokeless tobacco: Never   Substance and Sexual Activity   Alcohol use: No   Drug use: Yes    Types: Marijuana   Sexual activity: Not Currently  Other Topics Concern   Not on file  Social History Narrative   Not on file   Social Drivers of Health   Financial Resource Strain: Not on file  Food Insecurity: Not on file  Transportation Needs: Not on file  Physical Activity: Not on file  Stress: Not on file  Social Connections: Not on file  Intimate Partner Violence: Not on file     Review of Systems   Gen: Denies any fever, chills, fatigue, weight loss, lack of appetite.  CV: Denies chest pain, heart palpitations, peripheral edema, syncope.  Resp: Denies shortness of breath at rest or with exertion. Denies wheezing or cough.  GI: see HPI GU : Denies urinary burning, urinary frequency, urinary hesitancy MS: Denies joint pain, muscle weakness, cramps, or limitation of  movement.  Derm: Denies rash, itching, dry skin Psych: Denies depression, anxiety, memory loss, and confusion Heme: Denies bruising, bleeding, and enlarged lymph nodes.  Physical Exam   There were no vitals taken for this visit.  General:   Alert and oriented. Pleasant and cooperative. Well-nourished and well-developed.  Head:  Normocephalic and atraumatic. Eyes:  Without icterus, sclera clear and conjunctiva pink.  Ears:  Normal auditory acuity. Mouth:  No deformity or lesions, oral mucosa pink.  Lungs:  Clear to auscultation bilaterally. No wheezes, rales, or rhonchi. No distress.  Heart:  S1, S2 present without murmurs appreciated.  Abdomen:  +BS, soft, non-tender and non-distended. No HSM noted. No guarding or rebound. No masses appreciated.  Rectal:  Deferred  Msk:  Symmetrical without gross deformities. Normal posture. Extremities:  Without edema. Neurologic:  Alert and  oriented x4;  grossly normal neurologically. Skin:  Intact without significant lesions or rashes. Psych:  Alert and cooperative. Normal mood and  affect.  Assessment   Stephen Sullivan is a 68 y.o. male with a history of *** presenting today with   Dysphagia:    Constipation:    PLAN   ***    Julian Obey, MSN, FNP-BC, AGACNP-BC [facility]

## 2024-01-14 ENCOUNTER — Ambulatory Visit (INDEPENDENT_AMBULATORY_CARE_PROVIDER_SITE_OTHER): Admitting: Gastroenterology

## 2024-01-14 ENCOUNTER — Encounter: Payer: Self-pay | Admitting: Gastroenterology

## 2024-01-14 ENCOUNTER — Telehealth: Payer: Self-pay | Admitting: *Deleted

## 2024-01-14 ENCOUNTER — Other Ambulatory Visit: Payer: Self-pay | Admitting: *Deleted

## 2024-01-14 ENCOUNTER — Encounter: Payer: Self-pay | Admitting: *Deleted

## 2024-01-14 VITALS — BP 119/57 | HR 54 | Temp 98.6°F | Ht 70.0 in | Wt 177.2 lb

## 2024-01-14 DIAGNOSIS — Z860100 Personal history of colon polyps, unspecified: Secondary | ICD-10-CM | POA: Diagnosis not present

## 2024-01-14 DIAGNOSIS — K219 Gastro-esophageal reflux disease without esophagitis: Secondary | ICD-10-CM

## 2024-01-14 DIAGNOSIS — R1310 Dysphagia, unspecified: Secondary | ICD-10-CM

## 2024-01-14 DIAGNOSIS — K5900 Constipation, unspecified: Secondary | ICD-10-CM | POA: Diagnosis not present

## 2024-01-14 MED ORDER — PEG 3350-KCL-NA BICARB-NACL 420 G PO SOLR: 4000.0000 mL | Freq: Once | ORAL | 0 refills | Status: AC

## 2024-01-14 NOTE — Patient Instructions (Addendum)
 For constipation: Start stool softener daily - senna or docusate sodium (colace) Increase water  intake and decrease soda intake.  High fiber diet.   For reflux and swallowing issue: Cut foods in to small pieces and chew adequately prior to swallowing and alternate with small sips of liquid.  Avoid dry foods Increase your pantoprazole  to twice daily every day Follow a GERD diet:  Avoid fried, fatty, greasy, spicy, citrus foods. Avoid caffeine and carbonated beverages. Avoid chocolate. Try eating 4-6 small meals a day rather than 3 large meals. Do not eat within 3 hours of laying down. Prop head of bed up on wood or bricks to create a 6 inch incline.   We will get you scheduled for upper endoscopy with dilation of esophagus and colonoscopy with Dr. Riley Cheadle. I will give you smaples of linzess - this you will take before your colon prep to ensure you get cleaned out adequately.   It was a pleasure to see you today. I want to create trusting relationships with patients. If you receive a survey regarding your visit,  I greatly appreciate you taking time to fill this out on paper or through your MyChart. I value your feedback.  Julian Obey, MSN, FNP-BC, AGACNP-BC [facility]

## 2024-01-14 NOTE — Addendum Note (Signed)
 Addended by: Alvester Johnson on: 01/14/2024 02:29 PM   Modules accepted: Orders

## 2024-01-14 NOTE — Telephone Encounter (Signed)
 Cohere PA:  Pending: In clinical review Tracking [id number]

## 2024-02-04 ENCOUNTER — Other Ambulatory Visit: Payer: Self-pay

## 2024-02-04 DIAGNOSIS — K219 Gastro-esophageal reflux disease without esophagitis: Secondary | ICD-10-CM

## 2024-02-04 DIAGNOSIS — Z860100 Personal history of colon polyps, unspecified: Secondary | ICD-10-CM

## 2024-02-04 DIAGNOSIS — R1310 Dysphagia, unspecified: Secondary | ICD-10-CM

## 2024-02-05 ENCOUNTER — Ambulatory Visit: Payer: Self-pay | Admitting: Internal Medicine

## 2024-02-05 LAB — BASIC METABOLIC PANEL WITH GFR
BUN/Creatinine Ratio: 6 — ABNORMAL LOW (ref 10–24)
BUN: 8 mg/dL (ref 8–27)
CO2: 21 mmol/L (ref 20–29)
Calcium: 9.9 mg/dL (ref 8.6–10.2)
Chloride: 102 mmol/L (ref 96–106)
Creatinine, Ser: 1.38 mg/dL — ABNORMAL HIGH (ref 0.76–1.27)
Glucose: 91 mg/dL (ref 70–99)
Potassium: 4.1 mmol/L (ref 3.5–5.2)
Sodium: 142 mmol/L (ref 134–144)
eGFR: 56 mL/min/{1.73_m2} — ABNORMAL LOW (ref >59)

## 2024-02-14 ENCOUNTER — Ambulatory Visit (HOSPITAL_COMMUNITY)
Admission: RE | Admit: 2024-02-14 | Discharge: 2024-02-14 | Disposition: A | Discharge status: Home / Self Care | Attending: Internal Medicine | Admitting: Internal Medicine

## 2024-02-14 ENCOUNTER — Encounter (HOSPITAL_COMMUNITY)
Admission: RE | Disposition: A | Discharge status: Home / Self Care | Payer: Self-pay | Source: Home / Self Care | Attending: Internal Medicine

## 2024-02-14 ENCOUNTER — Encounter (HOSPITAL_COMMUNITY): Payer: Self-pay | Admitting: Internal Medicine

## 2024-02-14 ENCOUNTER — Ambulatory Visit (HOSPITAL_COMMUNITY): Admitting: Anesthesiology

## 2024-02-14 ENCOUNTER — Other Ambulatory Visit: Payer: Self-pay

## 2024-02-14 DIAGNOSIS — R1310 Dysphagia, unspecified: Secondary | ICD-10-CM

## 2024-02-14 DIAGNOSIS — K3189 Other diseases of stomach and duodenum: Secondary | ICD-10-CM | POA: Diagnosis not present

## 2024-02-14 DIAGNOSIS — Z8711 Personal history of peptic ulcer disease: Secondary | ICD-10-CM | POA: Diagnosis not present

## 2024-02-14 DIAGNOSIS — K317 Polyp of stomach and duodenum: Secondary | ICD-10-CM | POA: Diagnosis not present

## 2024-02-14 DIAGNOSIS — D120 Benign neoplasm of cecum: Secondary | ICD-10-CM | POA: Diagnosis not present

## 2024-02-14 DIAGNOSIS — F17210 Nicotine dependence, cigarettes, uncomplicated: Secondary | ICD-10-CM | POA: Diagnosis not present

## 2024-02-14 DIAGNOSIS — D123 Benign neoplasm of transverse colon: Secondary | ICD-10-CM

## 2024-02-14 DIAGNOSIS — Z860100 Personal history of colon polyps, unspecified: Secondary | ICD-10-CM | POA: Diagnosis not present

## 2024-02-14 DIAGNOSIS — I10 Essential (primary) hypertension: Secondary | ICD-10-CM | POA: Diagnosis not present

## 2024-02-14 DIAGNOSIS — Z1211 Encounter for screening for malignant neoplasm of colon: Secondary | ICD-10-CM

## 2024-02-14 DIAGNOSIS — K2980 Duodenitis without bleeding: Secondary | ICD-10-CM

## 2024-02-14 DIAGNOSIS — K219 Gastro-esophageal reflux disease without esophagitis: Secondary | ICD-10-CM | POA: Diagnosis not present

## 2024-02-14 DIAGNOSIS — K635 Polyp of colon: Secondary | ICD-10-CM | POA: Diagnosis not present

## 2024-02-14 DIAGNOSIS — K5730 Diverticulosis of large intestine without perforation or abscess without bleeding: Secondary | ICD-10-CM

## 2024-02-14 HISTORY — PX: COLONOSCOPY: SHX5424

## 2024-02-14 HISTORY — PX: ESOPHAGEAL DILATION: SHX303

## 2024-02-14 HISTORY — PX: ESOPHAGOGASTRODUODENOSCOPY: SHX5428

## 2024-02-14 SURGERY — COLONOSCOPY
Anesthesia: General

## 2024-02-14 MED ORDER — PROPOFOL 500 MG/50ML IV EMUL
INTRAVENOUS | Status: DC | PRN
  Administered 2024-02-14: 30 mg via INTRAVENOUS
  Administered 2024-02-14: 50 mg via INTRAVENOUS
  Administered 2024-02-14: 85 ug/kg/min via INTRAVENOUS
  Administered 2024-02-14: 50 mg via INTRAVENOUS
  Administered 2024-02-14: 100 mg via INTRAVENOUS

## 2024-02-14 MED ORDER — LIDOCAINE 2% (20 MG/ML) 5 ML SYRINGE
INTRAMUSCULAR | Status: DC | PRN
  Administered 2024-02-14: 80 mg via INTRAVENOUS

## 2024-02-14 MED ORDER — LACTATED RINGERS IV SOLN: INTRAVENOUS | Status: DC

## 2024-02-14 MED ORDER — LACTATED RINGERS IV SOLN: INTRAVENOUS | Status: DC | PRN

## 2024-02-14 MED ORDER — EPHEDRINE SULFATE-NACL 50-0.9 MG/10ML-% IV SOSY
PREFILLED_SYRINGE | INTRAVENOUS | Status: DC | PRN
  Administered 2024-02-14: 5 mg via INTRAVENOUS
  Administered 2024-02-14: 10 mg via INTRAVENOUS

## 2024-02-14 NOTE — Anesthesia Procedure Notes (Signed)
 Date/Time: 02/14/2024 9:04 AM  Performed by: Barbarann Verneita RAMAN, CRNAPre-anesthesia Checklist: Patient identified, Emergency Drugs available, Suction available, Timeout performed and Patient being monitored Patient Re-evaluated:Patient Re-evaluated prior to induction Oxygen Delivery Method: Nasal Cannula

## 2024-02-14 NOTE — Transfer of Care (Signed)
 Immediate Anesthesia Transfer of Care Note  Patient: Stephen Sullivan  Procedure(s) Performed: COLONOSCOPY EGD (ESOPHAGOGASTRODUODENOSCOPY) DILATION, ESOPHAGUS  Patient Location: Endoscopy Unit  Anesthesia Type:General  Level of Consciousness: awake and patient cooperative  Airway & Oxygen Therapy: Patient Spontanous Breathing  Post-op Assessment: Report given to RN and Post -op Vital signs reviewed and stable  Post vital signs: Reviewed and stable  Last Vitals:  Vitals Value Taken Time  BP 145/98 02/14/24 09:49  Temp 36.6 C 02/14/24 09:49  Pulse 72 02/14/24 09:49  Resp 17 02/14/24 09:49  SpO2 96 % 02/14/24 09:49    Last Pain:  Vitals:   02/14/24 0949  TempSrc: Oral  PainSc: 0-No pain      Patients Stated Pain Goal: 10 (02/14/24 0729)  Complications: No notable events documented.

## 2024-02-14 NOTE — Discharge Instructions (Signed)
 EGD Discharge instructions Please read the instructions outlined below and refer to this sheet in the next few weeks. These discharge instructions provide you with general information on caring for yourself after you leave the hospital. Your doctor may also give you specific instructions. While your treatment has been planned according to the most current medical practices available, unavoidable complications occasionally occur. If you have any problems or questions after discharge, please call your doctor. ACTIVITY You may resume your regular activity but move at a slower pace for the next 24 hours.  Take frequent rest periods for the next 24 hours.  Walking will help expel (get rid of) the air and reduce the bloated feeling in your abdomen.  No driving for 24 hours (because of the anesthesia (medicine) used during the test).  You may shower.  Do not sign any important legal documents or operate any machinery for 24 hours (because of the anesthesia used during the test).  NUTRITION Drink plenty of fluids.  You may resume your normal diet.  Begin with a light meal and progress to your normal diet.  Avoid alcoholic beverages for 24 hours or as instructed by your caregiver.  MEDICATIONS You may resume your normal medications unless your caregiver tells you otherwise.  WHAT YOU CAN EXPECT TODAY You may experience abdominal discomfort such as a feeling of fullness or "gas" pains.  FOLLOW-UP Your doctor will discuss the results of your test with you.  SEEK IMMEDIATE MEDICAL ATTENTION IF ANY OF THE FOLLOWING OCCUR: Excessive nausea (feeling sick to your stomach) and/or vomiting.  Severe abdominal pain and distention (swelling).  Trouble swallowing.  Temperature over 101 F (37.8 C).  Rectal bleeding or vomiting of blood.    Colonoscopy Discharge Instructions  Read the instructions outlined below and refer to this sheet in the next few weeks. These discharge instructions provide you with  general information on caring for yourself after you leave the hospital. Your doctor may also give you specific instructions. While your treatment has been planned according to the most current medical practices available, unavoidable complications occasionally occur. If you have any problems or questions after discharge, call Dr. Shaaron at [phone]. ACTIVITY You may resume your regular activity, but move at a slower pace for the next 24 hours.  Take frequent rest periods for the next 24 hours.  Walking will help get rid of the air and reduce the bloated feeling in your belly (abdomen).  No driving for 24 hours (because of the medicine (anesthesia) used during the test).   Do not sign any important legal documents or operate any machinery for 24 hours (because of the anesthesia used during the test).  NUTRITION Drink plenty of fluids.  You may resume your normal diet as instructed by your doctor.  Begin with a light meal and progress to your normal diet. Heavy or fried foods are harder to digest and may make you feel sick to your stomach (nauseated).  Avoid alcoholic beverages for 24 hours or as instructed.  MEDICATIONS You may resume your normal medications unless your doctor tells you otherwise.  WHAT YOU CAN EXPECT TODAY Some feelings of bloating in the abdomen.  Passage of more gas than usual.  Spotting of blood in your stool or on the toilet paper.  IF YOU HAD POLYPS REMOVED DURING THE COLONOSCOPY: No aspirin  products for 7 days or as instructed.  No alcohol for 7 days or as instructed.  Eat a soft diet for the next 24 hours.  FINDING  OUT THE RESULTS OF YOUR TEST Not all test results are available during your visit. If your test results are not back during the visit, make an appointment with your caregiver to find out the results. Do not assume everything is normal if you have not heard from your caregiver or the medical facility. It is important for you to follow up on all of your test  results.  SEEK IMMEDIATE MEDICAL ATTENTION IF: You have more than a spotting of blood in your stool.  Your belly is swollen (abdominal distention).  You are nauseated or vomiting.  You have a temperature over 101.  You have abdominal pain or discomfort that is severe or gets worse throughout the day.     Your esophagus was stretched today.  Small polyp removed  5 small polyps removed from your colon   follow-up with us  in 4 weeks  Further recommendations to follow pending review of pathology report

## 2024-02-14 NOTE — Op Note (Signed)
 [facility] Patient Name: Stephen Sullivan Procedure Date: 02/14/2024 8:47 AM MRN: [id number] Date of Birth: 05-Nov-1955 Attending MD: Lamar Ozell Hollingshead , MD, [id number] CSN: [id number] Age: 68 Admit Type: Outpatient Procedure:                Upper GI endoscopy Indications:              Dysphagia Providers:                Lamar Ozell Hollingshead, MD, Devere Lodge, Dorcas Lenis, Technician Referring MD:              Medicines:                Propofol  per Anesthesia Complications:            No immediate complications. Estimated Blood Loss:     Estimated blood loss was minimal. Procedure:                Pre-Anesthesia Assessment:                           - Prior to the procedure, a History and Physical                            was performed, and patient medications and                            allergies were reviewed. The patient's tolerance of                            previous anesthesia was also reviewed. The risks                            and benefits of the procedure and the sedation                            options and risks were discussed with the patient.                            All questions were answered, and informed consent                            was obtained. Prior Anticoagulants: The patient has                            taken no anticoagulant or antiplatelet agents. ASA                            Grade Assessment: II - A patient with mild systemic                            disease. After reviewing the risks and benefits,  the patient was deemed in satisfactory condition to                            undergo the procedure.                           After obtaining informed consent, the endoscope was                            passed under direct vision. Throughout the                            procedure, the patient's blood pressure, pulse, and                            oxygen saturations were  monitored continuously. The                            GIF-H190 ([id number]) scope was introduced through the                            mouth, and advanced to the second part of duodenum.                            The upper GI endoscopy was accomplished without                            difficulty. The patient tolerated the procedure                            well. Scope In: 9:13:04 AM Scope Out: 9:20:08 AM Total Procedure Duration: 0 hours 7 minutes 4 seconds  Findings:      The examined esophagus was normal.      The entire examined stomach was normal.      2 mm flat adenomatous appearing polyp in the duodenal bulb otherwise the       bulb and second portion appeared normal.The scope was withdrawn.       Dilation was performed with a Maloney dilator with mild resistance at 56       Fr. The dilation site was examined following endoscope reinsertion and       showed no change. Estimated blood loss: none. Finally the 2 mm polyp of       the duodenum was removed with cold snare. Impression:               - Normal esophagus. Dilated.                           - Normal stomach.                           - Duodenal nodule removed as described. Moderate Sedation:      Moderate (conscious) sedation was personally administered by an       anesthesia professional. The following parameters were monitored: oxygen       saturation, heart rate, blood pressure, respiratory rate, EKG, adequacy  of pulmonary ventilation, and response to care. Recommendation:           - Patient has a contact number available for                            emergencies. The signs and symptoms of potential                            delayed complications were discussed with the                            patient. Return to normal activities tomorrow.                            Written discharge instructions were provided to the                            patient.                           - Advance diet as  tolerated. Continue pantoprazole                             40 mg twice daily. Follow-up on pathology. See                            colonoscopy report. Procedure Code(s):        --- Professional ---                           [phone], Esophagogastroduodenoscopy, flexible,                            transoral; diagnostic, including collection of                            specimen(s) by brushing or washing, when performed                            (separate procedure)                           43450, Dilation of esophagus, by unguided sound or                            bougie, single or multiple passes Diagnosis Code(s):        --- Professional ---                           R13.10, Dysphagia, unspecified CPT copyright 2022 American Medical Association. All rights reserved. The codes documented in this report are preliminary and upon coder review may  be revised to meet current compliance requirements. Lamar HERO. Deavion Dobbs, MD Lamar Ozell Hollingshead, MD 02/14/2024 9:55:20 AM This report has been signed electronically. Number of Addenda: 0

## 2024-02-14 NOTE — Anesthesia Preprocedure Evaluation (Signed)
 Anesthesia Evaluation  Patient identified by MRN, date of birth, ID band Patient awake    Reviewed: Allergy & Precautions, H&P , NPO status , Patient's Chart, lab work & pertinent test results, reviewed documented beta blocker date and time   Airway Mallampati: II  TM Distance: >3 FB Neck ROM: full    Dental no notable dental hx.    Pulmonary shortness of breath, Current Smoker   Pulmonary exam normal breath sounds clear to auscultation       Cardiovascular Exercise Tolerance: Good hypertension,  Rhythm:regular Rate:Normal     Neuro/Psych  Headaches PSYCHIATRIC DISORDERS         GI/Hepatic Neg liver ROS, PUD,GERD  ,,  Endo/Other  negative endocrine ROS    Renal/GU negative Renal ROS  negative genitourinary   Musculoskeletal   Abdominal   Peds  Hematology negative hematology ROS (+)   Anesthesia Other Findings   Reproductive/Obstetrics negative OB ROS                              Anesthesia Physical Anesthesia Plan  ASA: 3  Anesthesia Plan: General   Post-op Pain Management:    Induction:   PONV Risk Score and Plan: Propofol  infusion  Airway Management Planned:   Additional Equipment:   Intra-op Plan:   Post-operative Plan:   Informed Consent: I have reviewed the patients History and Physical, chart, labs and discussed the procedure including the risks, benefits and alternatives for the proposed anesthesia with the patient or authorized representative who has indicated his/her understanding and acceptance.     Dental Advisory Given  Plan Discussed with: CRNA  Anesthesia Plan Comments:         Anesthesia Quick Evaluation

## 2024-02-14 NOTE — H&P (Signed)
 @LOGO @   Primary Care Physician:  Renato Dorothey HERO, NP Primary Gastroenterologist:  Dr. Shaaron  Pre-Procedure History & Physical: HPI:  Stephen Sullivan is a 68 y.o. male here for For further evaluation of dysphagia via EGD and surveillance colonoscopy given history of colon polyps.  Past Medical History:  Diagnosis Date   Arthritis    Back pain    Cataracts, bilateral    immature   GERD (gastroesophageal reflux disease)    using Baking Soda and Water  per pt   Headache(784.0)    related to neck issues   History of bronchitis    History of gastric ulcer 20+yrs ago   Hypertension    takes Benazepril  some days   Joint pain    Muscle spasm    takes Flexeril  daily   Shortness of breath    with exertion   Weakness    and numbness in left fingers    Past Surgical History:  Procedure Laterality Date   ABDOMINAL AORTOGRAM W/LOWER EXTREMITY N/A 10/18/2020   Procedure: ABDOMINAL AORTOGRAM W/LOWER EXTREMITY;  Surgeon: Sheree Penne Bruckner, MD;  Location: [facility] INVASIVE CV LAB;  Service: Cardiovascular;  Laterality: N/A;   ABDOMINAL AORTOGRAM W/LOWER EXTREMITY N/A 11/27/2022   Procedure: ABDOMINAL AORTOGRAM W/LOWER EXTREMITY;  Surgeon: Sheree Penne Bruckner, MD;  Location: [facility] INVASIVE CV LAB;  Service: Cardiovascular;  Laterality: N/A;   ANTERIOR CERVICAL DECOMP/DISCECTOMY FUSION N/A 12/18/2013   Procedure: ANTERIOR CERVICAL DECOMPRESSION/DISCECTOMY FUSION CERVICAL THREE-FOUR, FOUR-FIVE, FIVE-SIX W/ BONEGRAFT;  Surgeon: Lamar LELON Peaches, MD;  Location: [facility] NEURO ORS;  Service: Neurosurgery;  Laterality: N/A;  ANTERIOR CERVICAL DECOMPRESSION/DISCECTOMY FUSION CERVICAL THREE-FOUR, FOUR-FIVE, FIVE-SIX W/ BONEGRAFT   arm surgery Left    COLONOSCOPY WITH PROPOFOL  N/A 11/15/2017   Procedure: COLONOSCOPY WITH PROPOFOL ;  Surgeon: Shaaron Lamar HERO, MD;  Location: [facility] ENDO SUITE;  Service: Endoscopy;  Laterality: N/A;  12:30pm   ESOPHAGOGASTRODUODENOSCOPY (EGD) WITH PROPOFOL  N/A 10/05/2016    Procedure: ESOPHAGOGASTRODUODENOSCOPY (EGD) WITH PROPOFOL ;  Surgeon: Lamar HERO Shaaron, MD;  Location: [facility] ENDO SUITE;  Service: Endoscopy;  Laterality: N/A;   ESOPHAGOGASTRODUODENOSCOPY (EGD) WITH PROPOFOL  N/A 02/15/2017   Procedure: ESOPHAGOGASTRODUODENOSCOPY (EGD) WITH PROPOFOL ;  Surgeon: Shaaron Lamar HERO, MD;  Location: [facility];  Service: Endoscopy;  Laterality: N/A;  1115   HARDWARE REMOVAL Right 12/22/2021   Procedure: HARDWARE REMOVAL;  Surgeon: Onesimo Oneil LABOR, MD;  Location: [facility] ORS;  Service: Orthopedics;  Laterality: Right;   INGUINAL HERNIA REPAIR Right 08/18/2016   Procedure: RIGHT INGUINAL HERNORRHAPHY WITH MESH;  Surgeon: Oneil Budge, MD;  Location: [facility] ORS;  Service: General;  Laterality: Right;   JOINT REPLACEMENT Right    hip    PERIPHERAL VASCULAR INTERVENTION Right 10/18/2020   Procedure: PERIPHERAL VASCULAR INTERVENTION;  Surgeon: Sheree Penne Bruckner, MD;  Location: [facility] INVASIVE CV LAB;  Service: Cardiovascular;  Laterality: Right;   PERIPHERAL VASCULAR INTERVENTION Left 11/27/2022   Procedure: PERIPHERAL VASCULAR INTERVENTION;  Surgeon: Sheree Penne Bruckner, MD;  Location: [facility] INVASIVE CV LAB;  Service: Cardiovascular;  Laterality: Left;  L common iliac and L external iliac   POLYPECTOMY  11/15/2017   Procedure: POLYPECTOMY;  Surgeon: Shaaron Lamar HERO, MD;  Location: [facility];  Service: Endoscopy;;  polyp cs splenic flexure times 2,  cecal polyp   REVERSE SHOULDER ARTHROPLASTY Right 12/22/2021   Procedure: REVERSE SHOULDER ARTHROPLASTY;  Surgeon: Onesimo Oneil LABOR, MD;  Location: [facility] ORS;  Service: Orthopedics;  Laterality: Right;   SHOULDER SURGERY Right    x 3  Prior to Admission medications   Medication Sig Start Date End Date Taking? Authorizing Provider  amLODipine  (NORVASC ) 10 MG tablet Take 10 mg by mouth daily. 07/12/16  Yes [provider]  atorvastatin  (LIPITOR) 20 MG tablet Take 20 mg by mouth daily. 09/23/20  Yes [provider]   HYDROcodone -acetaminophen  (NORCO) 10-325 MG tablet Take 1 tablet by mouth every 8 (eight) hours as needed for moderate pain. 11/13/22  Yes [provider]  losartan -hydrochlorothiazide  (HYZAAR) 50-12.5 MG tablet Take 1 tablet by mouth daily. 09/01/20  Yes [provider]  pantoprazole  (PROTONIX ) 40 MG tablet Take 1 tablet (40 mg total) by mouth daily. Patient taking differently: Take 40 mg by mouth 2 (two) times daily. 10/08/18  Yes Marvis Camellia LABOR, NP  gabapentin  (NEURONTIN ) 100 MG capsule Take 1 capsule (100 mg total) by mouth 3 (three) times daily. Patient not taking: Reported on 07/04/2023 01/06/22   Onesimo Oneil LABOR, MD    Allergies as of 01/14/2024 - Review Complete 01/14/2024  Allergen Reaction Noted   Ibuprofen  Other (See Comments) 08/17/2021   Nsaids Other (See Comments) 11/02/2016    Family History  Problem Relation Age of Onset   Prostate cancer Father    Prostate cancer Brother    Colon cancer Neg Hx     Social History   Socioeconomic History   Marital status: Single    Spouse name: Not on file   Number of children: Not on file   Years of education: Not on file   Highest education level: Not on file  Occupational History   Occupation: disabled  Tobacco Use   Smoking status: Some Days    Current packs/day: 0.10    Average packs/day: 0.1 packs/day for 47.0 years (4.7 ttl pk-yrs)    Types: Cigarettes    Passive exposure: Never   Smokeless tobacco: Never  Vaping Use   Vaping status: Never Used  Substance and Sexual Activity   Alcohol use: No   Drug use: Yes    Types: Marijuana   Sexual activity: Not Currently  Other Topics Concern   Not on file  Social History Narrative   Not on file   Social Drivers of Health   Financial Resource Strain: Not on file  Food Insecurity: Not on file  Transportation Needs: Not on file  Physical Activity: Not on file  Stress: Not on file  Social Connections: Not on file  Intimate Partner Violence: Not on file     Review of Systems: See HPI, otherwise negative ROS  Physical Exam: BP (!) 140/85   Pulse (!) 54   Temp 97.7 F (36.5 C) (Oral)   Resp 18   Ht 5' 10 (1.778 m)   Wt 79.4 kg   SpO2 100%   BMI 25.11 kg/m  General:   Alert,  Well-developed, well-nourished, pleasant and cooperative in NAD Neck:  Supple; no masses or thyromegaly. No significant cervical adenopathy. Lungs:  Clear throughout to auscultation.   No wheezes, crackles, or rhonchi. No acute distress. Heart:  Regular rate and rhythm; no murmurs, clicks, rubs,  or gallops. Abdomen: Non-distended, normal bowel sounds.  Soft and nontender without appreciable mass or hepatosplenomegaly.  Impression/Plan:    68 year old gentleman with longstanding esophageal dysphagia on twice daily Protonix  for GERD.  Here for EGD with possible esophageal dilation as feasible/appropriate as well as a surveillance colonoscopy per plan.  Recs per plan.  The risks, benefits, limitations, imponderables and alternatives regarding both EGD and colonoscopy have been reviewed with the  patient. Questions have been answered. All parties agreeable.       Notice: This dictation was prepared with Dragon dictation along with smaller phrase technology. Any transcriptional errors that result from this process are unintentional and may not be corrected upon review.

## 2024-02-14 NOTE — Op Note (Signed)
 [facility] Patient Name: Stephen Sullivan Procedure Date: 02/14/2024 8:46 AM MRN: [id number] Date of Birth: 12-31-1955 Attending MD: Lamar Ozell Hollingshead , MD, [id number] CSN: [id number] Age: 68 Admit Type: Outpatient Procedure:                Colonoscopy Indications:              High risk colon cancer surveillance: Personal                            history of colonic polyps Providers:                Lamar Ozell Hollingshead, MD, Devere Lodge, Dorcas Lenis, Technician Referring MD:              Medicines:                Propofol  per Anesthesia Complications:            No immediate complications. Estimated Blood Loss:     Estimated blood loss was minimal. Procedure:                Pre-Anesthesia Assessment:                           - Prior to the procedure, a History and Physical                            was performed, and patient medications and                            allergies were reviewed. The patient's tolerance of                            previous anesthesia was also reviewed. The risks                            and benefits of the procedure and the sedation                            options and risks were discussed with the patient.                            All questions were answered, and informed consent                            was obtained. Prior Anticoagulants: The patient has                            taken no anticoagulant or antiplatelet agents. ASA                            Grade Assessment: II - A patient with mild systemic  disease. After reviewing the risks and benefits,                            the patient was deemed in satisfactory condition to                            undergo the procedure.                           After obtaining informed consent, the colonoscope                            was passed under direct vision. Throughout the                            procedure, the  patient's blood pressure, pulse, and                            oxygen saturations were monitored continuously. The                            [id number]) scope was introduced through the                            anus and advanced to the the cecum, identified by                            appendiceal orifice and ileocecal valve. The                            colonoscopy was performed without difficulty. The                            patient tolerated the procedure well. The quality                            of the bowel preparation was adequate. The                            ileocecal valve, appendiceal orifice, and rectum                            were photographed. Scope In: 9:28:46 AM Scope Out: 9:43:42 AM Scope Withdrawal Time: 0 hours 12 minutes 6 seconds  Total Procedure Duration: 0 hours 14 minutes 56 seconds  Findings:      The perianal and digital rectal examinations were normal.      Scattered small-mouthed diverticula were found in the entire colon.      Three sessile polyps were found in the cecum. The polyps were 2 to 4 mm       in size. These polyps were removed with a cold biopsy forceps. Resection       and retrieval were complete. Estimated blood loss was minimal.      Two sessile polyps were found in the hepatic flexure. The polyps were 5  to 6 mm in size. These polyps were removed with a cold snare. Resection       and retrieval were complete. Estimated blood loss was minimal.      The exam was otherwise without abnormality.      The retroflexed view of the distal rectum and anal verge was normal and       showed no anal or rectal abnormalities. Impression:               - Diverticulosis in the entire examined colon.                           - Three 2 to 4 mm polyps in the cecum, removed with                            a cold biopsy forceps. Resected and retrieved.                           - Two 5 to 6 mm polyps at the hepatic flexure,                             removed with a cold snare. Resected and retrieved.                           - The examination was otherwise normal.                           - The distal rectum and anal verge are normal on                            retroflexion view. Moderate Sedation:      Moderate (conscious) sedation was personally administered by an       anesthesia professional. The following parameters were monitored: oxygen       saturation, heart rate, blood pressure, respiratory rate, EKG, adequacy       of pulmonary ventilation, and response to care. Recommendation:           - Patient has a contact number available for                            emergencies. The signs and symptoms of potential                            delayed complications were discussed with the                            patient. Return to normal activities tomorrow.                            Written discharge instructions were provided to the                            patient.                           - Advance  diet as tolerated.                           - Repeat colonoscopy date to be determined after                            pending pathology results are reviewed for                            surveillance.                           - Return to GI office in 4 weeks. See EGD report. Procedure Code(s):        --- Professional ---                           [phone], Colonoscopy, flexible; with removal of                            tumor(s), polyp(s), or other lesion(s) by snare                            technique                           45380, 59, Colonoscopy, flexible; with biopsy,                            single or multiple Diagnosis Code(s):        --- Professional ---                           Z86.010, Personal history of colonic polyps                           D12.0, Benign neoplasm of cecum                           D12.3, Benign neoplasm of transverse colon (hepatic                            flexure or  splenic flexure)                           K57.30, Diverticulosis of large intestine without                            perforation or abscess without bleeding CPT copyright 2022 [employer]. All rights reserved. The codes documented in this report are preliminary and upon coder review may  be revised to meet current compliance requirements. Lamar HERO. Ragnar Waas, MD Lamar Ozell Hollingshead, MD 02/14/2024 10:00:07 AM This report has been signed electronically. Number of Addenda: 0

## 2024-02-15 ENCOUNTER — Encounter (HOSPITAL_COMMUNITY): Payer: Self-pay | Admitting: Internal Medicine

## 2024-02-15 LAB — SURGICAL PATHOLOGY

## 2024-02-15 NOTE — Anesthesia Postprocedure Evaluation (Signed)
 Anesthesia Post Note  Patient: Stephen Sullivan  Procedure(s) Performed: COLONOSCOPY EGD (ESOPHAGOGASTRODUODENOSCOPY) DILATION, ESOPHAGUS  Patient location during evaluation: Phase II Anesthesia Type: General Level of consciousness: awake Pain management: pain level controlled Vital Signs Assessment: post-procedure vital signs reviewed and stable Respiratory status: spontaneous breathing and respiratory function stable Cardiovascular status: blood pressure returned to baseline and stable Postop Assessment: no headache and no apparent nausea or vomiting Anesthetic complications: no Comments: Late entry   No notable events documented.   Last Vitals:  Vitals:   02/14/24 0741 02/14/24 0949  BP: (!) 140/85 (!) 145/98  Pulse: (!) 54 72  Resp: 18 17  Temp: 36.5 C 36.6 C  SpO2: 100% 96%    Last Pain:  Vitals:   02/14/24 0949  TempSrc: Oral  PainSc: 0-No pain                 [name]

## 2024-02-17 ENCOUNTER — Ambulatory Visit: Payer: Self-pay | Admitting: Internal Medicine

## 2024-02-19 ENCOUNTER — Encounter: Payer: Self-pay | Admitting: Orthopedic Surgery

## 2024-02-19 ENCOUNTER — Ambulatory Visit: Admitting: Orthopedic Surgery

## 2024-02-19 ENCOUNTER — Other Ambulatory Visit (INDEPENDENT_AMBULATORY_CARE_PROVIDER_SITE_OTHER): Payer: Self-pay

## 2024-02-19 DIAGNOSIS — Z96611 Presence of right artificial shoulder joint: Secondary | ICD-10-CM

## 2024-02-19 NOTE — Progress Notes (Signed)
 Orthopaedic Postop Note  Assessment: Stephen Sullivan is a 68 y.o. male s/p Right Reverse Shoulder Arthroplasty (DOS: 12/22/2021)  Plan: Mr. Boomer is doing very well, approximately 2 years out from surgery.  He had an episode of pain a couple months ago, but otherwise has been doing well.  It does not stop him from doing the things he wants to do.  He is pleased with his improvements.  Nothing else needed at this time.   Follow-up: Return if symptoms worsen or fail to improve.  XR at next visit: Right shoulder  Subjective:  Chief Complaint  Patient presents with   Routine Post Op    R RSA 12/22/21    History of Present Illness: Stephen Sullivan is a 68 y.o. male who presents following the above stated procedure.  Surgery was approximately 2 years ago.  He has done well.  He reports an episode of brief pain a couple of months ago.  However, it has not returned.  He has no issues with his right shoulder.  He is able to do everything.  He still has some burning pains in the lateral left thigh, but this does not stop him.  He is not interested in further treatment at this time.  Review of Systems: No fevers or chills No numbness or tingling No Chest Pain No shortness of breath   Objective: There were no vitals taken for this visit.  Physical Exam:  Alert and oriented.  No acute distress.  Right shoulder surgical incisions are healed.  No surrounding erythema or drainage.  He has no tenderness.  170 degrees of forward flexion.  110 degrees of abduction.  Internal rotation to T12.  Excellent strength in the right shoulder.   IMAGING: I personally ordered and reviewed the following images:  X-rays of the right shoulder were obtained in clinic today.  These are compared to available x-rays.  Right reverse shoulder arthroplasty in stable alignment.  No evidence of subsidence.  No lucency around the prostheses.  Shoulder is reduced.  No bony lesions.  Impression: Stable right  reverse shoulder arthroplasty  Oneil DELENA Horde, MD 02/19/2024 10:53 AM

## 2024-03-12 ENCOUNTER — Ambulatory Visit: Admitting: Gastroenterology

## 2024-03-12 ENCOUNTER — Encounter: Payer: Self-pay | Admitting: Gastroenterology

## 2024-03-12 VITALS — BP 155/82 | HR 55 | Temp 97.8°F | Ht 70.0 in | Wt 168.6 lb

## 2024-03-12 DIAGNOSIS — K5900 Constipation, unspecified: Secondary | ICD-10-CM

## 2024-03-12 DIAGNOSIS — R1310 Dysphagia, unspecified: Secondary | ICD-10-CM

## 2024-03-12 DIAGNOSIS — Z860100 Personal history of colon polyps, unspecified: Secondary | ICD-10-CM

## 2024-03-12 DIAGNOSIS — Z860101 Personal history of adenomatous and serrated colon polyps: Secondary | ICD-10-CM

## 2024-03-12 DIAGNOSIS — K219 Gastro-esophageal reflux disease without esophagitis: Secondary | ICD-10-CM

## 2024-03-12 DIAGNOSIS — K5730 Diverticulosis of large intestine without perforation or abscess without bleeding: Secondary | ICD-10-CM

## 2024-03-12 DIAGNOSIS — N189 Chronic kidney disease, unspecified: Secondary | ICD-10-CM

## 2024-03-12 DIAGNOSIS — R7989 Other specified abnormal findings of blood chemistry: Secondary | ICD-10-CM

## 2024-03-12 MED ORDER — OMEPRAZOLE 40 MG PO CPDR: 40.0000 mg | DELAYED_RELEASE_CAPSULE | Freq: Two times a day (BID) | ORAL | 3 refills | Status: AC

## 2024-03-12 NOTE — Patient Instructions (Signed)
 VISIT SUMMARY:  You came in today for a follow-up after your recent upper endoscopy and colonoscopy. You reported improvements in swallowing difficulties and bowel movements, but you continue to experience heartburn and acid reflux. We discussed your current medications and made some adjustments to better manage your symptoms. We also reviewed your overall health, including your kidney function and blood pressure.  YOUR PLAN: -GASTROESOPHAGEAL REFLUX DISEASE (GERD) WITH PERSISTENT SYMPTOMS: GERD is a condition where stomach acid frequently flows back into the tube connecting your mouth and stomach, causing heartburn and other symptoms. We are switching your medication from pantoprazole  to omeprazole  40 mg daily, with the option to take it twice daily if needed. If omeprazole  is not effective after several months, we may consider trying Voquezna.  -PANCOLONIC DIVERTICULOSIS, POST-POLYPECTOMY SURVEILLANCE: Pancolonic diverticulosis is the presence of small pouches in the colon. During your colonoscopy, three tubular adenomas were removed, and we will continue to monitor your condition.  You will need repeat colonoscopy in 3 years.  -CHRONIC KIDNEY DISEASE, UNSPECIFIED STAGE, WITH HYPERTENSION MONITORING RENAL FUNCTION: Chronic kidney disease is a condition where the kidneys are damaged and can't filter blood as well as they should. Your recent labs showed elevated creatinine levels, which could be due to dehydration and high blood pressure. We will send your lab results to your primary care doctor for follow-up and encourage you to drink more water .  INSTRUCTIONS: Please follow up with your primary care physician to review your kidney function and blood pressure. Make sure to take omeprazole  as prescribed and monitor your symptoms. If you do not see improvement after several months, we may consider trying Voquezna. Continue to drink more water  to help with potential dehydration.  Follow-up in the office  in 6 months.  It was a pleasure to see you today. I want to create trusting relationships with patients. If you receive a survey regarding your visit,  I greatly appreciate you taking time to fill this out on paper or through your MyChart. I value your feedback.  Charmaine Melia, MSN, FNP-BC, AGACNP-BC [facility]

## 2024-03-12 NOTE — Progress Notes (Signed)
 GI Office Note    Referring Provider: Renato Dorothey HERO, NP Primary Care Physician:  Renato Dorothey HERO, NP Primary Gastroenterologist: Lamar HERO.Rourk, MD  Date:  03/12/2024  ID:  Stephen Sullivan, DOB 04/23/1956, MRN [id number]  Chief Complaint   Chief Complaint  Patient presents with   Follow-up    Doing well, no issues   History of Present Illness  Stephen Sullivan is a 68 y.o. male with a history of GERD, cataracts, arthritis, HTN, gastric ulcer, and headaches presenting today for follow-up post procedures of dysphagia and constipation.  EGD March 2018: - LA grade a esophagitis - Small hiatal hernia - Nonbleeding gastric ulcer with visible vessels s/p injection and treatment with heater probe - Advised PPI infusion for 72 hours, check H. pylori serologies, avoid NSAIDs and repeat EGD in 3 weeks   EGD July 2018: - Normal esophagus - Normal stomach, scarring noted - Normal duodenum - No repeat EGD needed.  Advise continue Protonix  daily indefinitely and avoid all NSAIDs.   Colonoscopy April 2019: - Multiple polyps splenic flexure -Three 4 to 6 mm polyps at the splenic flexure and cecum -Nonbleeding internal hemorrhoids - Path: tubular adenomas - Repeat in 2022   Last office visit 01/14/24.  Reported neck surgery about 10 years prior with pole removed and replaced and has a twitch pain every so often.  Dysphagia occurring for few years but primarily with milk and also has some esophageal burning similar to acid reflux after drinking milk.  Having slow bowel movements and incomplete bowel movements but does not take anything on a regular basis and does admit to straining.  He has taken stool softeners given by his sister at times but reports he does not take on a regular basis and does not drink much water , mostly tea and soda.  Has been on pantoprazole  twice daily for many years and has never tried a different PPI but does admit to eating spicy foods and drinks coffee to get  his pills down and those also feel like they get hung up and possible issue with blueberry in regards to choking.. Scheduled for EGD with dilation and colonoscopy.  Advise daily stool softener and increasing water  intake and continue twice daily pantoprazole  consistently.  Reflux diet reinforced.  02/05/24 Dr. Shaaron advised patient to follow-up with PCP regarding potentially worsening renal function with creatinine 1.38 and GFR 56.  EGD 02/14/2024: - Normal esophagus. Dilated.  - Normal stomach.  - 2 mm flat duodenal nodule removed with cold snare. -Pathology with polypoid duodenal mucosa with extensive surface gastric foveolar metaplasia suggestive of nonvisual or peptic duodenitis. - Advised to continue pantoprazole  40 mg twice daily.  Colonoscopy 02/14/2024: - Diverticulosis in the entire examined colon.  - Three 2 to 4 mm polyps in the cecum (tubular adenoma) - Two 5 to 6 mm polyps at the hepatic flexure (tubular adenomas) - The examination was otherwise normal.  - The distal rectum and anal verge are normal on retroflexion view. - Repeat colonoscopy in 3 years.  Today:  Discussed the use of AI scribe software for clinical note transcription with the patient, who gave verbal consent to proceed.  He has experienced improvement in swallowing difficulties since his recent procedures. Previously, he had dysphagia primarily with milk, causing a burning sensation in his esophagus and a choking episode with a blueberry. The issue with milk has lessened, and he has reduced his intake.  He continues to experience heartburn and acid reflux, occurring intermittently on  a daily basis. He takes pantoprazole  twice daily but often forgets the evening dose. He feels the medication does not fully alleviate his symptoms.  Regarding bowel movements, he reports significant improvement, with regular movements every day and a half and no longer straining. He is not using any stool softeners or other medications  for this issue. He acknowledges not drinking as much water  as recommended, preferring tea and sodas instead.  He has not yet followed up with his primary care physician. He has a history of high blood pressure and smokes, with a pack lasting about three to three and a half days. No chest pain or shortness of breath but notes a decrease in the strength of his urine stream, attributing it to age. Has not followed up with his primary care regarding this or the decreased kidney function.      Wt Readings from Last 5 Encounters:  03/12/24 168 lb 9.6 oz (76.5 kg)  02/14/24 175 lb (79.4 kg)  01/14/24 177 lb 3.2 oz (80.4 kg)  07/04/23 168 lb 4.8 oz (76.3 kg)  01/10/23 160 lb 3.2 oz (72.7 kg)    Current Outpatient Medications  Medication Sig Dispense Refill   atorvastatin  (LIPITOR) 20 MG tablet Take 20 mg by mouth daily.     HYDROcodone -acetaminophen  (NORCO) 10-325 MG tablet Take 1 tablet by mouth every 8 (eight) hours as needed for moderate pain.     losartan -hydrochlorothiazide  (HYZAAR) 50-12.5 MG tablet Take 1 tablet by mouth daily.     pantoprazole  (PROTONIX ) 40 MG tablet Take 1 tablet (40 mg total) by mouth daily. (Patient taking differently: Take 40 mg by mouth 2 (two) times daily.) 90 tablet 3   No current facility-administered medications for this visit.   Facility-Administered Medications Ordered in Other Visits  Medication Dose Route Frequency Provider Last Rate Last Admin   fentaNYL  (SUBLIMAZE ) injection    PRN Sheree Penne Bruckner, MD   50 mcg at 11/27/22 0920    Past Medical History:  Diagnosis Date   Arthritis    Back pain    Cataracts, bilateral    immature   GERD (gastroesophageal reflux disease)    using Baking Soda and Water  per pt   Headache(784.0)    related to neck issues   History of bronchitis    History of gastric ulcer 20+yrs ago   Hypertension    takes Benazepril  some days   Joint pain    Muscle spasm    takes Flexeril  daily   Shortness of breath     with exertion   Weakness    and numbness in left fingers    Past Surgical History:  Procedure Laterality Date   ABDOMINAL AORTOGRAM W/LOWER EXTREMITY N/A 10/18/2020   Procedure: ABDOMINAL AORTOGRAM W/LOWER EXTREMITY;  Surgeon: Sheree Penne Bruckner, MD;  Location: [facility] INVASIVE CV LAB;  Service: Cardiovascular;  Laterality: N/A;   ABDOMINAL AORTOGRAM W/LOWER EXTREMITY N/A 11/27/2022   Procedure: ABDOMINAL AORTOGRAM W/LOWER EXTREMITY;  Surgeon: Sheree Penne Bruckner, MD;  Location: [facility] INVASIVE CV LAB;  Service: Cardiovascular;  Laterality: N/A;   ANTERIOR CERVICAL DECOMP/DISCECTOMY FUSION N/A 12/18/2013   Procedure: ANTERIOR CERVICAL DECOMPRESSION/DISCECTOMY FUSION CERVICAL THREE-FOUR, FOUR-FIVE, FIVE-SIX W/ BONEGRAFT;  Surgeon: Lamar LELON Peaches, MD;  Location: [facility] NEURO ORS;  Service: Neurosurgery;  Laterality: N/A;  ANTERIOR CERVICAL DECOMPRESSION/DISCECTOMY FUSION CERVICAL THREE-FOUR, FOUR-FIVE, FIVE-SIX W/ BONEGRAFT   arm surgery Left    COLONOSCOPY N/A 02/14/2024   Procedure: COLONOSCOPY;  Surgeon: Shaaron Lamar HERO, MD;  Location: [facility];  Service:  Endoscopy;  Laterality: N/A;  9:00 am, ok rm 1/2   COLONOSCOPY WITH PROPOFOL  N/A 11/15/2017   Procedure: COLONOSCOPY WITH PROPOFOL ;  Surgeon: Shaaron Lamar HERO, MD;  Location: [facility];  Service: Endoscopy;  Laterality: N/A;  12:30pm   ESOPHAGEAL DILATION N/A 02/14/2024   Procedure: DILATION, ESOPHAGUS;  Surgeon: Shaaron Lamar HERO, MD;  Location: [facility];  Service: Endoscopy;  Laterality: N/A;   ESOPHAGOGASTRODUODENOSCOPY N/A 02/14/2024   Procedure: EGD (ESOPHAGOGASTRODUODENOSCOPY);  Surgeon: Shaaron Lamar HERO, MD;  Location: [facility];  Service: Endoscopy;  Laterality: N/A;   ESOPHAGOGASTRODUODENOSCOPY (EGD) WITH PROPOFOL  N/A 10/05/2016   Procedure: ESOPHAGOGASTRODUODENOSCOPY (EGD) WITH PROPOFOL ;  Surgeon: Lamar HERO Shaaron, MD;  Location: [facility];  Service: Endoscopy;  Laterality: N/A;   ESOPHAGOGASTRODUODENOSCOPY (EGD) WITH  PROPOFOL  N/A 02/15/2017   Procedure: ESOPHAGOGASTRODUODENOSCOPY (EGD) WITH PROPOFOL ;  Surgeon: Shaaron Lamar HERO, MD;  Location: [facility];  Service: Endoscopy;  Laterality: N/A;  1115   HARDWARE REMOVAL Right 12/22/2021   Procedure: HARDWARE REMOVAL;  Surgeon: Onesimo Oneil LABOR, MD;  Location: [facility];  Service: Orthopedics;  Laterality: Right;   INGUINAL HERNIA REPAIR Right 08/18/2016   Procedure: RIGHT INGUINAL HERNORRHAPHY WITH MESH;  Surgeon: Oneil Budge, MD;  Location: [facility];  Service: General;  Laterality: Right;   JOINT REPLACEMENT Right    hip    PERIPHERAL VASCULAR INTERVENTION Right 10/18/2020   Procedure: PERIPHERAL VASCULAR INTERVENTION;  Surgeon: Sheree Penne Bruckner, MD;  Location: [facility] INVASIVE CV LAB;  Service: Cardiovascular;  Laterality: Right;   PERIPHERAL VASCULAR INTERVENTION Left 11/27/2022   Procedure: PERIPHERAL VASCULAR INTERVENTION;  Surgeon: Sheree Penne Bruckner, MD;  Location: [facility] INVASIVE CV LAB;  Service: Cardiovascular;  Laterality: Left;  L common iliac and L external iliac   POLYPECTOMY  11/15/2017   Procedure: POLYPECTOMY;  Surgeon: Shaaron Lamar HERO, MD;  Location: [facility];  Service: Endoscopy;;  polyp cs splenic flexure times 2,  cecal polyp   REVERSE SHOULDER ARTHROPLASTY Right 12/22/2021   Procedure: REVERSE SHOULDER ARTHROPLASTY;  Surgeon: Onesimo Oneil LABOR, MD;  Location: [facility];  Service: Orthopedics;  Laterality: Right;   SHOULDER SURGERY Right    x 3     Family History  Problem Relation Age of Onset   Prostate cancer Father    Prostate cancer Brother    Colon cancer Neg Hx     Allergies as of 03/12/2024 - Review Complete 03/12/2024  Allergen Reaction Noted   Ibuprofen  Other (See Comments) 08/17/2021   Nsaids Other (See Comments) 11/02/2016    Social History   Socioeconomic History   Marital status: Single    Spouse name: Not on file   Number of children: Not on file   Years of education: Not on file   Highest education level: Not on  file  Occupational History   Occupation: disabled  Tobacco Use   Smoking status: Some Days    Current packs/day: 0.10    Average packs/day: 0.1 packs/day for 47.0 years (4.7 ttl pk-yrs)    Types: Cigarettes    Passive exposure: Never   Smokeless tobacco: Never  Vaping Use   Vaping status: Never Used  Substance and Sexual Activity   Alcohol use: No   Drug use: Yes    Types: Marijuana   Sexual activity: Not Currently  Other Topics Concern   Not on file  Social History Narrative   Not on file   Social Drivers of Health   Financial Resource Strain: Not on file  Food  Insecurity: Not on file  Transportation Needs: Not on file  Physical Activity: Not on file  Stress: Not on file  Social Connections: Not on file     Review of Systems   Gen: Denies fever, chills, anorexia. Denies fatigue, weakness, weight loss.  CV: Denies chest pain, palpitations, syncope, peripheral edema, and claudication. Resp: Denies dyspnea at rest, cough, wheezing, coughing up blood, and pleurisy. GI: See HPI Derm: Denies rash, itching, dry skin Psych: Denies depression, anxiety, memory loss, confusion. No homicidal or suicidal ideation.  Heme: Denies bruising, bleeding, and enlarged lymph nodes.  Physical Exam   BP (!) 155/82 (BP Location: Right Arm, Patient Position: Sitting, Cuff Size: Normal)   Pulse (!) 55   Temp 97.8 F (36.6 C) (Oral)   Ht 5' 10 (1.778 m)   Wt 168 lb 9.6 oz (76.5 kg)   SpO2 97%   BMI 24.19 kg/m   General:   Alert and oriented. No distress noted. Pleasant and cooperative.  Head:  Normocephalic and atraumatic. Eyes:  Conjuctiva clear without scleral icterus. Mouth:  Oral mucosa pink and moist. Poor dentition. No lesions. Abdomen:  non-distended Rectal: deferred Msk:  Symmetrical without gross deformities. Normal posture. Extremities:  Without edema. Neurologic:  Alert and  oriented x4 Psych:  Alert and cooperative. Normal mood and affect.  Assessment & Plan   Stephen Sullivan is a 68 y.o. male presenting today for follow up post procedures.     Gastroesophageal reflux disease (GERD) with persistent symptoms GERD with persistent symptoms despite pantoprazole  treatment. Symptoms include intermittent burning and heartburn, exacerbated by milk consumption. Non-compliance with twice-daily dosing. Would like to stop pantoprazole  given it is not helping. Has been on this long term. Using tums as needed for relief.  - Switch from pantoprazole  to omeprazole  40 mg daily, with the option to take twice daily if needed for symptom control. - Consider Voquezna if omeprazole  is ineffective after several months, with samples available for trial.  Dysphagia, improved post-endoscopic dilation Dysphagia has improved following endoscopic dilation, previously associated with milk and esophageal burning. Has decreased milk intake.  - Repeat EGD with dilation as needed.   Constipation, improved Constipation has improved without stool softeners in the past, not currently taking. Bowel movements are more regular, occurring every 1.5 days, with no straining.  Pan-colonic diverticulosis, post-polypectomy surveillance Pan-colonic diverticulosis identified during colonoscopy in July with removal of three tubular adenomas. - Recall for colonoscopy in 3 years.   Chronic kidney disease, unspecified stage, with hypertension monitoring renal function Chronic kidney disease with recent labs showing elevated creatinine (1.38). Possible contributing factors include dehydration and hypertension as well as tobacco use. No recent follow-up with primary care or nephrology. - Ensure lab results are sent to primary care for follow-up on kidney function. - Encourage increased water  intake to address potential dehydration. - Decrease tobacco use with goal for cessation.    Follow Up   Recall for colonoscopy in 2028 Follow up 6 months.     Charmaine Melia, MSN, FNP-BC,  AGACNP-BC [facility]

## 2024-09-15 ENCOUNTER — Ambulatory Visit: Admitting: Gastroenterology
